# Patient Record
Sex: Male | Born: 1940
Health system: Southern US, Community
[De-identification: ages and names within clinical notes are randomized; demographics above are authoritative.]

## PROBLEM LIST (undated history)

## (undated) DIAGNOSIS — N183 Chronic kidney disease, stage 3 unspecified: Secondary | ICD-10-CM

## (undated) DIAGNOSIS — N529 Male erectile dysfunction, unspecified: Secondary | ICD-10-CM

## (undated) DIAGNOSIS — D329 Benign neoplasm of meninges, unspecified: Secondary | ICD-10-CM

## (undated) DIAGNOSIS — E876 Hypokalemia: Secondary | ICD-10-CM

## (undated) DIAGNOSIS — I482 Chronic atrial fibrillation, unspecified: Secondary | ICD-10-CM

## (undated) DIAGNOSIS — C4491 Basal cell carcinoma of skin, unspecified: Secondary | ICD-10-CM

## (undated) DIAGNOSIS — L309 Dermatitis, unspecified: Secondary | ICD-10-CM

## (undated) DIAGNOSIS — D696 Thrombocytopenia, unspecified: Secondary | ICD-10-CM

## (undated) DIAGNOSIS — K922 Gastrointestinal hemorrhage, unspecified: Secondary | ICD-10-CM

## (undated) DIAGNOSIS — I10 Essential (primary) hypertension: Secondary | ICD-10-CM

## (undated) HISTORY — PX: EYE SURGERY: SHX253

## (undated) HISTORY — DX: Hypokalemia: E87.6

## (undated) HISTORY — DX: Basal cell carcinoma of skin, unspecified: C44.91

## (undated) HISTORY — DX: Male erectile dysfunction, unspecified: N52.9

## (undated) HISTORY — PX: TONSILLECTOMY: SUR1361

## (undated) HISTORY — DX: Thrombocytopenia, unspecified: D69.6

## (undated) HISTORY — DX: Benign neoplasm of meninges, unspecified: D32.9

## (undated) HISTORY — DX: Dermatitis, unspecified: L30.9

## (undated) HISTORY — DX: Essential (primary) hypertension: I10

## (undated) HISTORY — DX: Gastrointestinal hemorrhage, unspecified: K92.2

## (undated) HISTORY — PX: TOENAIL EXCISION: SHX183

---

## 2004-12-08 ENCOUNTER — Encounter: Payer: Self-pay | Admitting: Cardiology

## 2004-12-08 ENCOUNTER — Encounter: Payer: Self-pay | Admitting: Family Medicine

## 2004-12-08 ENCOUNTER — Inpatient Hospital Stay (HOSPITAL_COMMUNITY): Admission: EM | Admit: 2004-12-08 | Discharge: 2004-12-15 | Payer: Self-pay | Admitting: *Deleted

## 2004-12-14 ENCOUNTER — Encounter: Payer: Self-pay | Admitting: Family Medicine

## 2004-12-14 ENCOUNTER — Encounter: Payer: Self-pay | Admitting: Cardiology

## 2004-12-14 HISTORY — PX: TEE WITH CARDIOVERSION: SHX5442

## 2006-05-19 ENCOUNTER — Inpatient Hospital Stay (HOSPITAL_COMMUNITY): Admission: EM | Admit: 2006-05-19 | Discharge: 2006-05-24 | Payer: Self-pay | Admitting: Emergency Medicine

## 2006-06-10 ENCOUNTER — Encounter: Payer: Self-pay | Admitting: Family Medicine

## 2007-08-13 ENCOUNTER — Encounter (INDEPENDENT_AMBULATORY_CARE_PROVIDER_SITE_OTHER): Payer: Self-pay | Admitting: *Deleted

## 2007-08-13 ENCOUNTER — Encounter: Payer: Self-pay | Admitting: Family Medicine

## 2007-08-13 LAB — CONVERTED CEMR LAB
ALT: 14 units/L
AST: 17 units/L
Albumin: 4.6 g/dL
Alkaline Phosphatase: 84 units/L
Chloride, Serum: 107 mmol/L
Creatinine, Ser: 1.17 mg/dL
LDL Cholesterol: 79 mg/dL
MCH: 33.7 pg
MCV: 91.4 fL
Platelets: 146 10*3/uL
Potassium, serum: 4.4 mmol/L
Sodium, serum: 144 mmol/L
Total Bilirubin: 0.8 mg/dL
Total Protein: 7.2 g/dL

## 2008-06-15 ENCOUNTER — Encounter (INDEPENDENT_AMBULATORY_CARE_PROVIDER_SITE_OTHER): Payer: Self-pay | Admitting: *Deleted

## 2008-06-25 ENCOUNTER — Ambulatory Visit: Payer: Self-pay | Admitting: Family Medicine

## 2008-06-25 DIAGNOSIS — F528 Other sexual dysfunction not due to a substance or known physiological condition: Secondary | ICD-10-CM

## 2008-06-25 DIAGNOSIS — I10 Essential (primary) hypertension: Secondary | ICD-10-CM | POA: Insufficient documentation

## 2008-06-25 DIAGNOSIS — I482 Chronic atrial fibrillation, unspecified: Secondary | ICD-10-CM | POA: Insufficient documentation

## 2008-06-30 ENCOUNTER — Encounter (INDEPENDENT_AMBULATORY_CARE_PROVIDER_SITE_OTHER): Payer: Self-pay | Admitting: *Deleted

## 2008-07-20 ENCOUNTER — Encounter (INDEPENDENT_AMBULATORY_CARE_PROVIDER_SITE_OTHER): Payer: Self-pay | Admitting: *Deleted

## 2008-08-09 ENCOUNTER — Ambulatory Visit: Payer: Self-pay | Admitting: Family Medicine

## 2008-08-09 DIAGNOSIS — IMO0002 Reserved for concepts with insufficient information to code with codable children: Secondary | ICD-10-CM

## 2008-08-12 ENCOUNTER — Encounter (INDEPENDENT_AMBULATORY_CARE_PROVIDER_SITE_OTHER): Payer: Self-pay | Admitting: *Deleted

## 2008-08-12 ENCOUNTER — Telehealth (INDEPENDENT_AMBULATORY_CARE_PROVIDER_SITE_OTHER): Payer: Self-pay | Admitting: *Deleted

## 2008-08-17 ENCOUNTER — Encounter (INDEPENDENT_AMBULATORY_CARE_PROVIDER_SITE_OTHER): Payer: Self-pay | Admitting: *Deleted

## 2008-08-23 ENCOUNTER — Telehealth (INDEPENDENT_AMBULATORY_CARE_PROVIDER_SITE_OTHER): Payer: Self-pay | Admitting: *Deleted

## 2008-08-26 ENCOUNTER — Ambulatory Visit: Payer: Self-pay | Admitting: Gastroenterology

## 2008-09-09 ENCOUNTER — Ambulatory Visit: Payer: Self-pay | Admitting: Gastroenterology

## 2008-09-16 ENCOUNTER — Telehealth (INDEPENDENT_AMBULATORY_CARE_PROVIDER_SITE_OTHER): Payer: Self-pay | Admitting: *Deleted

## 2008-11-24 ENCOUNTER — Telehealth (INDEPENDENT_AMBULATORY_CARE_PROVIDER_SITE_OTHER): Payer: Self-pay | Admitting: *Deleted

## 2009-02-08 ENCOUNTER — Ambulatory Visit: Payer: Self-pay | Admitting: Family Medicine

## 2009-02-22 ENCOUNTER — Telehealth (INDEPENDENT_AMBULATORY_CARE_PROVIDER_SITE_OTHER): Payer: Self-pay | Admitting: *Deleted

## 2009-05-25 ENCOUNTER — Telehealth (INDEPENDENT_AMBULATORY_CARE_PROVIDER_SITE_OTHER): Payer: Self-pay | Admitting: *Deleted

## 2009-08-09 ENCOUNTER — Telehealth (INDEPENDENT_AMBULATORY_CARE_PROVIDER_SITE_OTHER): Payer: Self-pay | Admitting: *Deleted

## 2009-11-07 ENCOUNTER — Ambulatory Visit: Payer: Self-pay | Admitting: Family Medicine

## 2009-11-08 LAB — CONVERTED CEMR LAB
ALT: 15 units/L (ref 0–53)
Basophils Relative: 0.7 % (ref 0.0–3.0)
Bilirubin, Direct: 0.2 mg/dL (ref 0.0–0.3)
CO2: 31 meq/L (ref 19–32)
Chloride: 106 meq/L (ref 96–112)
Eosinophils Absolute: 0.1 10*3/uL (ref 0.0–0.7)
HCT: 39 % (ref 39.0–52.0)
Hemoglobin: 13.3 g/dL (ref 13.0–17.0)
Lymphocytes Relative: 25.3 % (ref 12.0–46.0)
MCHC: 34.2 g/dL (ref 30.0–36.0)
MCV: 92.1 fL (ref 78.0–100.0)
Monocytes Absolute: 0.4 10*3/uL (ref 0.1–1.0)
Neutro Abs: 3.3 10*3/uL (ref 1.4–7.7)
Potassium: 4.6 meq/L (ref 3.5–5.1)
RBC: 4.23 M/uL (ref 4.22–5.81)
Sodium: 145 meq/L (ref 135–145)
Total CHOL/HDL Ratio: 3
Total Protein: 6.9 g/dL (ref 6.0–8.3)
Triglycerides: 107 mg/dL (ref 0.0–149.0)

## 2010-05-10 ENCOUNTER — Ambulatory Visit: Payer: Self-pay | Admitting: Family Medicine

## 2010-08-22 ENCOUNTER — Telehealth (INDEPENDENT_AMBULATORY_CARE_PROVIDER_SITE_OTHER): Payer: Self-pay | Admitting: *Deleted

## 2010-09-03 LAB — CONVERTED CEMR LAB
Basophils Absolute: 0 10*3/uL (ref 0.0–0.1)
Bilirubin, Direct: 0.1 mg/dL (ref 0.0–0.3)
Calcium: 9.6 mg/dL (ref 8.4–10.5)
Cholesterol: 157 mg/dL (ref 0–200)
GFR calc Af Amer: 86 mL/min
HCT: 42 % (ref 39.0–52.0)
HDL: 44.2 mg/dL (ref >39.0)
Hemoglobin: 14.4 g/dL (ref 13.0–17.0)
LDL Cholesterol: 88 mg/dL (ref 0–99)
Lymphocytes Relative: 22.8 % (ref 12.0–46.0)
MCHC: 34.3 g/dL (ref 30.0–36.0)
Monocytes Absolute: 0.4 10*3/uL (ref 0.1–1.0)
Neutro Abs: 3.1 10*3/uL (ref 1.4–7.7)
Platelets: 121 10*3/uL — ABNORMAL LOW (ref 150–400)
Potassium: 3.7 meq/L (ref 3.5–5.1)
RDW: 13.1 % (ref 11.5–14.6)
Sodium: 144 meq/L (ref 135–145)
TSH: 1.81 microintl units/mL (ref 0.35–5.50)
Total Bilirubin: 1.1 mg/dL (ref 0.3–1.2)
Triglycerides: 123 mg/dL (ref 0–149)

## 2010-09-04 ENCOUNTER — Ambulatory Visit: Payer: Self-pay | Admitting: Cardiology

## 2010-09-05 NOTE — Assessment & Plan Note (Signed)
Summary: cpx/alr  ns/alr   Vital Signs:  Patient profile:   70 year old male Height:      75 inches Weight:      210 pounds BMI:     26.34 Pulse rate:   62 / minute BP sitting:   130 / 74  (left arm)  Vitals Entered By: Doristine Devoid (November 07, 2009 10:20 AM) CC: CPX AND LABS    History of Present Illness: [name] here today for yearly exam.  1) HTN- BP well controlled on current meds.  no CP, SOB, HAs, visual changes, N/V, edema.  2) AFib- HR well controlled, sees Dr Swaziland (last seen in Jan).  3) ED- Viagra adequately working  4) Health Maintainence- UTD on colonoscopy, Pneumovax, Tetanus.  Preventive Screening-Counseling & Management  Alcohol-Tobacco     Alcohol drinks/day: 0     Smoking Status: never      Sexual History:  currently monogamous.        Drug Use:  never.    Problems Prior to Update: 1)  Special Screening Malignant Neoplasm of Prostate  (ICD-V76.44) 2)  Healthy Adult Male  (ICD-V70.0) 3)  Carcinoma, Skin, Squamous Cell  (ICD-173.9) 4)  Special Screening For Malignant Neoplasms Colon  (ICD-V76.51) 5)  Erectile Dysfunction  (ICD-302.72) 6)  Hypertension  (ICD-401.9) 7)  Atrial Fibrillation  (ICD-427.31)  Current Medications (verified): 1)  Dilt-Cd 240 Mg Xr24h-Cap (Diltiazem Hcl Coated Beads) .Marland Kitchen.. 1 By Mouth Once Daily 2)  Klor-Con M20 20 Meq Cr-Tabs (Potassium Chloride Crys Cr) .... Take One Tablet Daily 3)  Benazepril Hcl 10 Mg Tabs (Benazepril Hcl) .... Take One Tablet Daily 4)  Sotalol Hcl 80 Mg Tabs (Sotalol Hcl) .... Take One Tablet Two Times A Day 5)  Viagra 100 Mg Tabs (Sildenafil Citrate) .... Take As Needed 6)  Magnesium Oxide 400 Mg Caps (Magnesium Oxide) .... Take One Tablet Daily 7)  Saw Palmetto Berries 540 Mg Caps (Saw Palmetto (Serenoa Repens)) .... Take 2 Tablets Daily 8)  Vitamin E 400 Unit Caps (Vitamin E) .... Take One Tablet Daily 9)  Vitamin C 500 Mg Tabs (Ascorbic Acid) .... Take One Tablet Daliy 10)  Aspirin 81 Mg Tbec  (Aspirin) .... Take One Tablet Daily 11)  Fish Oil 1000 Mg Caps (Omega-3 Fatty Acids) .... Take One Tablet Daily  Allergies (verified): No Known Drug Allergies  Past History:  Past Medical History: Last updated: 06/25/2008 Atrial fibrillation Hypertension Erectile Dysfunction   Past Surgical History: Last updated: 06/25/2008 Tonsillectomy R eye surgery-spot on cornea   Family History: Last updated: 06/25/2008 CAD-mother-CHF HTN-mother STROKE-no DM-mother COLON CA-no PROSTATE CA-no  Social History: Last updated: 08/09/2008 Married No tobacco  Social History: Sexual History:  currently monogamous Drug Use:  never  Review of Systems  The patient denies anorexia, fever, weight loss, weight gain, vision loss, decreased hearing, chest pain, syncope, dyspnea on exertion, peripheral edema, prolonged cough, headaches, abdominal pain, melena, hematochezia, severe indigestion/heartburn, hematuria, suspicious skin lesions, depression, abnormal bleeding, enlarged lymph nodes, and testicular masses.    Physical Exam  General:  Well-developed,well-nourished,in no acute distress; alert,appropriate and cooperative throughout examination Head:  Normocephalic and atraumatic without obvious abnormalities. No apparent alopecia or balding. Eyes:  No corneal or conjunctival inflammation noted. EOMI. Perrla. Funduscopic exam benign, without hemorrhages, exudates or papilledema. Vision grossly normal. Ears:  External ear exam shows no significant lesions or deformities.  Otoscopic examination reveals clear canals, tympanic membranes are intact bilaterally without bulging, retraction, inflammation or discharge. Hearing  is grossly normal bilaterally. Nose:  External nasal examination shows no deformity or inflammation. Nasal mucosa are pink and moist without lesions or exudates. Mouth:  Oral mucosa and oropharynx without lesions or exudates.  Teeth in good repair. Neck:  No deformities, masses,  or tenderness noted. Lungs:  Normal respiratory effort, chest expands symmetrically. Lungs are clear to auscultation, no crackles or wheezes. Heart:  reg S1/S2 Abdomen:  Bowel sounds positive,abdomen soft and non-tender without masses, organomegaly or hernias noted. Rectal:  No external abnormalities noted. Normal sphincter tone. No rectal masses or tenderness. Genitalia:  Testes bilaterally descended without nodularity, tenderness or masses. No scrotal masses or lesions. No penis lesions or urethral discharge. Prostate:  Prostate gland firm and smooth, no enlargement, nodularity, tenderness, mass, asymmetry or induration. Msk:  No deformity or scoliosis noted of thoracic or lumbar spine.   Pulses:  +2 carotid, radial, DP Extremities:  No clubbing, cyanosis, edema, or deformity noted  Neurologic:  No cranial nerve deficits noted. Station and gait are normal. Plantar reflexes are down-going bilaterally. DTRs are symmetrical throughout. Sensory, motor and coordinative functions appear intact. Skin:  Intact without suspicious lesions or rashes Cervical Nodes:  No lymphadenopathy noted Inguinal Nodes:  No significant adenopathy Psych:  Cognition and judgment appear intact. Alert and cooperative with normal attention span and concentration. No apparent delusions, illusions, hallucinations   Impression & Recommendations:  Problem # 1:  HYPERTENSION (ICD-401.9) Assessment Unchanged BP well controlled.  due for labs.  no med changes at this time. His updated medication list for this problem includes:    Dilt-cd 240 Mg Xr24h-cap (Diltiazem hcl coated beads) .Marland Kitchen... 1 by mouth once daily    Benazepril Hcl 10 Mg Tabs (Benazepril hcl) .Marland Kitchen... Take one tablet daily    Sotalol Hcl 80 Mg Tabs (Sotalol hcl) .Marland Kitchen... Take one tablet two times a day  Orders: Venipuncture (14782) TLB-BMP (Basic Metabolic Panel-BMET) (80048-METABOL) TLB-CBC Platelet - w/Differential (85025-CBCD) TLB-TSH (Thyroid Stimulating  Hormone) (84443-TSH)  Problem # 2:  ATRIAL FIBRILLATION (ICD-427.31) Assessment: Unchanged continues to follow w/ Dr Swaz[place].  will fax copy of office visit and labs. His updated medication list for this problem includes:    Dilt-cd 240 Mg Xr24h-cap (Diltiazem hcl coated beads) .Marland Kitchen... 1 by mouth once daily    Sotalol Hcl 80 Mg Tabs (Sotalol hcl) .Marland Kitchen... Take one tablet two times a day    Aspirin 81 Mg Tbec (Aspirin) .Marland Kitchen... Take one tablet daily  Problem # 3:  ERECTILE DYSFUNCTION (ICD-302.72) Assessment: Unchanged sxs improved w/ Viagra, refill as needed. His updated medication list for this problem includes:    Viagra 100 Mg Tabs (Sildenafil citrate) .Marland Kitchen... Take as needed  Problem # 4:  HEALTHY ADULT MALE (ICD-V70.0) Assessment: Unchanged due for labs today, PE WNL. Orders: TLB-Hepatic/Liver Function Pnl (80076-HEPATIC) TLB-Lipid Panel (80061-LIPID)  Problem # 5:  SPECIAL SCREENING MALIGNANT NEOPLASM OF PROSTATE (ICD-V76.44) Assessment: New lab collected. Orders: TLB-PSA (Prostate Specific Antigen) (84153-PSA)  Complete Medication List: 1)  Dilt-cd 240 Mg Xr24h-cap (Diltiazem hcl coated beads) .Marland Kitchen.. 1 by mouth once daily 2)  Klor-con M20 20 Meq Cr-tabs (Potassium chloride crys cr) .... Take one tablet daily 3)  Benazepril Hcl 10 Mg Tabs (Benazepril hcl) .... Take one tablet daily 4)  Sotalol Hcl 80 Mg Tabs (Sotalol hcl) .... Take one tablet two times a day 5)  Viagra 100 Mg Tabs (Sildenafil citrate) .... Take as needed 6)  Magnesium Oxide 400 Mg Caps (Magnesium oxide) .... Take one tablet daily 7)  Saw  Palmetto Berries 540 Mg Caps (Saw palmetto (serenoa repens)) .... Take 2 tablets daily 8)  Vitamin E 400 Unit Caps (Vitamin e) .... Take one tablet daily 9)  Vitamin C 500 Mg Tabs (Ascorbic acid) .... Take one tablet daliy 10)  Aspirin 81 Mg Tbec (Aspirin) .... Take one tablet daily 11)  Fish Oil 1000 Mg Caps (Omega-3 fatty acids) .... Take one tablet daily  Patient  Instructions: 1)  Please schedule a follow-up appointment in 6 months to recheck blood pressure. 2)  Keep up the good work on diet and exercise 3)  Your exam looks great! 4)  We'll notify you of your lab results 5)  Call with any questions or concerns 6)  Happy Odis Luster!  Prescriptions: BENAZEPRIL HCL 10 MG TABS (BENAZEPRIL HCL) take one tablet daily  #90 x 3   Entered by:   Doristine Devoid   Authorized by:   Neena Rhymes MD   Signed by:   Doristine Devoid on 11/07/2009   Method used:   Electronically to        [facility] (mail-order)             ,          Ph: [phone]       Fax: [phone]   RxID:   [id number]      Immunization History:  Tetanus/Td Immunization History:    Tetanus/Td:  utd  per pt (11/07/2009)

## 2010-09-05 NOTE — Progress Notes (Signed)
Summary: NEEDS FOUR 90 DAY PRESC; ALSO NEEDS 30 DAY PRESCRIP  Phone Note Call from Patient Call back at Home Phone [phone]   Caller: Patient Summary of Call: PATIENT NEEDS FOUR 90 DAY PRESCRIPTIONS PLUS 3 REFILLS FOR THE FOLLOWING MEDICATIONS:  1)  SOTALOL HCL 80 MG---TWICE DAILY 2)  BENAZEPRIL 10 MG---ONE DAILY  3)  DILTIAZ 240 / 24 MG---ONE DAILY 4)  KLOR-CON M20----ONE DAILY  HE HAS SPOKEN TO [facility] TODAY----HIS ACCOUNT WITH THEM IS ALL SET UP  AND THEY TOLD HIM TO ASK [place] TO FAX THESE FOUR PRESCRIPTIONS TO THEM   HE ALSO NEEDS A 2 WEEK SUPPLY OF BENAZEPRIL 10 MG CALLED INTO [facility] ON [address]---CAN YOU CHECK---IF IT IS CHEAPTER TO GET 30 DAYS INSTEAD OF 15 DAYS, PLEASE CALL IN A 30--IF NOT, THEN CALL IN A 15 DAY SUPPLY Initial call taken by: Jerolyn Shin,  August 09, 2009 3:08 PM  Follow-up for Phone Call        spoke w/ patient aware prescription sent to pharmacy ........Marland KitchenDoristine Devoid  August 10, 2009 4:02 PM     Prescriptions: SOTALOL HCL 80 MG TABS (SOTALOL HCL) take one tablet two times a day  #180 x 3   Entered by:   Doristine Devoid   Authorized by:   Neena Rhymes MD   Signed by:   Doristine Devoid on 08/10/2009   Method used:   Electronically to        [facility]* (mail-order)             ,          Ph: [phone]       Fax: [phone]   RxID:   [id number] BENAZEPRIL HCL 10 MG TABS (BENAZEPRIL HCL) take one tablet daily  #30 x 3   Entered by:   Doristine Devoid   Authorized by:   Neena Rhymes MD   Signed by:   Doristine Devoid on 08/10/2009   Method used:   Electronically to        [facility]* (mail-order)             ,          Ph: [phone]       Fax: [phone]   RxID:   [id number] KLOR-CON M20 20 MEQ CR-TABS (POTASSIUM CHLORIDE CRYS CR) take one tablet daily  #90 x 3   Entered by:   Doristine Devoid   Authorized by:   Neena Rhymes MD   Signed by:   Doristine Devoid on 08/10/2009   Method used:   Electronically to   [facility] (mail-order)             ,          Ph: [phone]       Fax: [phone]   RxID:   [id number] DILT-CD 240 MG XR24H-CAP (DILTIAZEM HCL COATED BEADS) 1 by mouth once daily  #90 x 3   Entered by:   Doristine Devoid   Authorized by:   Neena Rhymes MD   Signed by:   Doristine Devoid on 08/10/2009   Method used:   Electronically to        [facility]* (mail-order)             ,          Ph: [phone]       Fax: [phone]   RxID:   [id number] BENAZEPRIL HCL 10 MG TABS (BENAZEPRIL HCL) take one tablet daily  #  30 x 0   Entered by:   Doristine Devoid   Authorized by:   Neena Rhymes MD   Signed by:   Doristine Devoid on 08/10/2009   Method used:   Electronically to        [facility] (retail)       [address]       [place], [place]  [address]       Ph: [phone]       Fax: [phone]   RxID:   [id number]

## 2010-09-05 NOTE — Assessment & Plan Note (Signed)
Summary: 6 MONTH RETURN VISIT///SPH   Vital Signs:  Patient profile:   70 year old male Height:      75 inches Weight:      207 pounds BMI:     25.97 Pulse rate:   96 / minute BP sitting:   120 / 86  (left arm)  Vitals Entered By: Doristine Devoid CMA (May 10, 2010 11:16 AM) CC: f/u on meds    History of Present Illness: 70 yo man here today for f/u on  1) HTN- adequate control on ACE, dilt, sotalol.  denies CP, SOB, HAs, visual changes, edema.  ranging 106-141/62-78.  2) Atrial Fibrillation- sees Dr Swaziland.  Current Medications (verified): 1)  Dilt-Cd 240 Mg Xr24h-Cap (Diltiazem Hcl Coated Beads) .Marland Kitchen.. 1 By Mouth Once Daily 2)  Klor-Con M20 20 Meq Cr-Tabs (Potassium Chloride Crys Cr) .... Take One Tablet Daily 3)  Benazepril Hcl 10 Mg Tabs (Benazepril Hcl) .... Take One Tablet Daily 4)  Sotalol Hcl 80 Mg Tabs (Sotalol Hcl) .... Take One Tablet Two Times A Day 5)  Viagra 100 Mg Tabs (Sildenafil Citrate) .... Take As Needed 6)  Magnesium Oxide 400 Mg Caps (Magnesium Oxide) .... Take One Tablet Daily 7)  Saw Palmetto Berries 540 Mg Caps (Saw Palmetto (Serenoa Repens)) .... Take 2 Tablets Daily 8)  Vitamin E 400 Unit Caps (Vitamin E) .... Take One Tablet Daily 9)  Vitamin C 500 Mg Tabs (Ascorbic Acid) .... Take One Tablet Daliy 10)  Aspirin 81 Mg Tbec (Aspirin) .... Take One Tablet Daily 11)  Fish Oil 1000 Mg Caps (Omega-3 Fatty Acids) .... Take One Tablet Daily  Allergies (verified): No Known Drug Allergies  Past History:  Past Medical History: Last updated: 06/25/2008 Atrial fibrillation Hypertension Erectile Dysfunction   Review of Systems      See HPI  Physical Exam  General:  Well-developed,well-nourished,in no acute distress; alert,appropriate and cooperative throughout examination Head:  Normocephalic and atraumatic without obvious abnormalities. No apparent alopecia or balding. Neck:  No deformities, masses, or tenderness noted. Lungs:  Normal respiratory  effort, chest expands symmetrically. Lungs are clear to auscultation, no crackles or wheezes. Heart:  reg S1/S2, no murmur Pulses:  +2 carotid, radial, DP Extremities:  No clubbing, cyanosis, edema, or deformity noted    Impression & Recommendations:  Problem # 1:  HYPERTENSION (ICD-401.9) Assessment Unchanged BP remains controlled.  asymptomatic.  no med changes at this time. His updated medication list for this problem includes:    Dilt-cd 240 Mg Xr24h-cap (Diltiazem hcl coated beads) .Marland Kitchen... 1 by mouth once daily    Benazepril Hcl 10 Mg Tabs (Benazepril hcl) .Marland Kitchen... Take one tablet daily    Sotalol Hcl 80 Mg Tabs (Sotalol hcl) .Marland Kitchen... Take one tablet two times a day  Complete Medication List: 1)  Dilt-cd 240 Mg Xr24h-cap (Diltiazem hcl coated beads) .Marland Kitchen.. 1 by mouth once daily 2)  Klor-con M20 20 Meq Cr-tabs (Potassium chloride crys cr) .... Take one tablet daily 3)  Benazepril Hcl 10 Mg Tabs (Benazepril hcl) .... Take one tablet daily 4)  Sotalol Hcl 80 Mg Tabs (Sotalol hcl) .... Take one tablet two times a day 5)  Viagra 100 Mg Tabs (Sildenafil citrate) .... Take as needed 6)  Magnesium Oxide 400 Mg Caps (Magnesium oxide) .... Take one tablet daily 7)  Saw Palmetto Berries 540 Mg Caps (Saw palmetto (serenoa repens)) .... Take 2 tablets daily 8)  Vitamin E 400 Unit Caps (Vitamin e) .... Take one tablet daily 9)  Vitamin C 500 Mg Tabs (Ascorbic acid) .... Take one tablet daliy 10)  Aspirin 81 Mg Tbec (Aspirin) .... Take one tablet daily 11)  Fish Oil 1000 Mg Caps (Omega-3 fatty acids) .... Take one tablet daily  Patient Instructions: 1)  Follow up in 6 months for your complete physical- do not eat before this appt 2)  Keep up the good work!  You look great! 3)  Call with any questions or concerns 4)  Send my love to Vernona Rieger- we're wishing her a speedy recovery! 5)  Hang in there!!!   Immunization History:  Influenza Immunization History:    Influenza:  historical (04/06/2010)

## 2010-09-07 NOTE — Progress Notes (Signed)
Summary: 3 prescrip to [facility] and 1 prescrip to [facility]  Phone Note Refill Request Call back at Home Phone [phone] Message from:  Patient's wife on August 22, 2010 3:51 PM  Refills Requested: Medication #1:  DILT-CD 240 MG XR24H-CAP 1 by mouth once daily   Notes: please call into [facility]  Medication #2:  KLOR-CON M20 20 MEQ CR-TABS take one tablet daily   Notes: please call into [facility]  Medication #3:  SOTALOL HCL 80 MG TABS take one tablet two times a day   Notes: please call into [facility]  Medication #4:  SOTALOL HCL 80 MG TABS take one tablet two times a day   Notes: also [facility] 30 [facility] [address], [place] see notes --3 prescriptions for [employer] and 1 prescription for local pharmacy = [facility] on [address]  Next Appointment Scheduled: Mon 4/2   [place] Initial call taken by: Jerolyn Shin,  August 22, 2010 4:40 PM    Prescriptions: KLOR-CON M20 20 MEQ CR-TABS (POTASSIUM CHLORIDE CRYS CR) take one tablet daily  #90 x 3   Entered by:   Doristine Devoid CMA   Authorized by:   Neena Rhymes MD   Signed by:   Doristine Devoid CMA on 08/24/2010   Method used:   Electronically to        [facility] (retail)             ,          Ph: [phone]       Fax: [phone]   RxID:   [id number] DILT-CD 240 MG XR24H-CAP (DILTIAZEM HCL COATED BEADS) 1 by mouth once daily  #90 x 3   Entered by:   Doristine Devoid CMA   Authorized by:   Neena Rhymes MD   Signed by:   Doristine Devoid CMA on 08/24/2010   Method used:   Electronically to        [facility] (retail)             ,          Ph: [phone]       Fax: [phone]   RxID:   [id number] SOTALOL HCL 80 MG TABS (SOTALOL HCL) take one tablet two times a day  #180 x 3   Entered by:   Doristine Devoid CMA   Authorized by:   Neena Rhymes MD   Signed by:   Doristine Devoid CMA on 08/24/2010   Method used:   Electronically to        [facility] (retail)             ,          Ph:  [phone]       Fax: [phone]   RxID:   [id number] SOTALOL HCL 80 MG TABS (SOTALOL HCL) take one tablet two times a day  #30 x 0   Entered by:   Doristine Devoid CMA   Authorized by:   Neena Rhymes MD   Signed by:   Doristine Devoid CMA on 08/24/2010   Method used:   Electronically to        [facility] (retail)       [address]       [place], [place]  [address]       Ph: [phone]       Fax: [phone]   RxID:   [id number]

## 2010-10-12 ENCOUNTER — Encounter: Payer: Self-pay | Admitting: Family Medicine

## 2010-11-06 ENCOUNTER — Ambulatory Visit (INDEPENDENT_AMBULATORY_CARE_PROVIDER_SITE_OTHER): Payer: Medicare Other | Admitting: Family Medicine

## 2010-11-06 ENCOUNTER — Encounter: Payer: Self-pay | Admitting: Family Medicine

## 2010-11-06 DIAGNOSIS — R972 Elevated prostate specific antigen [PSA]: Secondary | ICD-10-CM | POA: Insufficient documentation

## 2010-11-06 DIAGNOSIS — Z0000 Encounter for general adult medical examination without abnormal findings: Secondary | ICD-10-CM

## 2010-11-06 DIAGNOSIS — Z125 Encounter for screening for malignant neoplasm of prostate: Secondary | ICD-10-CM

## 2010-11-06 DIAGNOSIS — I4891 Unspecified atrial fibrillation: Secondary | ICD-10-CM

## 2010-11-06 DIAGNOSIS — I10 Essential (primary) hypertension: Secondary | ICD-10-CM

## 2010-11-06 LAB — CBC WITH DIFFERENTIAL/PLATELET
Basophils Relative: 0.3 % (ref 0.0–3.0)
Eosinophils Relative: 1 % (ref 0.0–5.0)
Lymphocytes Relative: 12 % (ref 12.0–46.0)
MCV: 92.5 fl (ref 78.0–100.0)
Monocytes Relative: 6.5 % (ref 3.0–12.0)
Neutrophils Relative %: 80.2 % — ABNORMAL HIGH (ref 43.0–77.0)
RBC: 4.39 Mil/uL (ref 4.22–5.81)
WBC: 9.6 10*3/uL (ref 4.5–10.5)

## 2010-11-06 LAB — PSA: PSA: 5.97 ng/mL — ABNORMAL HIGH (ref 0.10–4.00)

## 2010-11-06 LAB — BASIC METABOLIC PANEL
BUN: 23 mg/dL (ref 6–23)
CO2: 30 mEq/L (ref 19–32)
Chloride: 101 mEq/L (ref 96–112)
Glucose, Bld: 99 mg/dL (ref 70–99)
Potassium: 4.3 mEq/L (ref 3.5–5.1)

## 2010-11-06 LAB — HEPATIC FUNCTION PANEL
ALT: 12 U/L (ref 0–53)
AST: 17 U/L (ref 0–37)
Albumin: 4 g/dL (ref 3.5–5.2)

## 2010-11-06 LAB — TSH: TSH: 1.53 u[IU]/mL (ref 0.35–5.50)

## 2010-11-06 LAB — LIPID PANEL: Total CHOL/HDL Ratio: 3

## 2010-11-06 NOTE — Progress Notes (Signed)
  Subjective:    Patient ID: [name], male    DOB: Jul 30, 1941, 70 y.o.   MRN: [id number]  HPI Here today for CPE.  Risk Factors: HTN- BP mildly elevated today, was recently on cold meds.  Home BPs ranging 106-141/60-80.  No CP, SOB, HAs, visual changes, edema. Afib- following w/ Dr Swaziland Physical Activity: walks regularly, cares for handicapped sister Depression: denies sxs of depression Hearing: normal to whispered voice ADL's: independent Cognitive: normal linear thought process, memory intact, no deficits noted. Home Safety: safe at home, lives w/ wife Height, Weight, BMI, Visual Acuity: see vitals, vision corrected to 20/20 w/ glasses Counseling: gets yearly flu shots, has had Pneumovax, Vostavax. Labs Ordered: See A&P Care Plan: See A&P Preventative Care: UTD on colonoscopy ([place])   Review of Systems The patient denies anorexia, fever, weight loss, weight gain, vision loss, decreased hearing, hoarseness, chest pain, syncope, dyspnea on exertion, peripheral edema, prolonged cough, headaches, abdominal pain, melena, severe indigestion/heartburn, hematuria, suspicious skin lesions, depression, abnormal bleeding, enlarged lymph nodes, and testicular masses.  Past medical, surgical, family, and social hx reviewed for relevance and changes      Objective:   Physical Exam BP 140/88  Temp(Src) 99.4 F (37.4 C) (Oral)  Ht 6\' 3"  (1.905 m)  Wt 211 lb 2 oz (95.766 kg)  BMI 26.39 kg/m2  General Appearance:    Alert, cooperative, no distress, appears stated age  Head:    Normocephalic, without obvious abnormality, atraumatic  Eyes:    PERRL, conjunctiva/corneas clear, EOM's intact  Ears:    Normal TM's and external ear canals, both ears  Nose:   Nares normal, septum midline, mucosa normal, no drainage   or sinus tenderness  Throat:   Lips, mucosa, and tongue normal; gums normal  Neck:   Supple, symmetrical, trachea midline, no adenopathy;       thyroid:  No  enlargement/tenderness/nodules  Back:     Symmetric, no curvature, ROM normal, no CVA tenderness  Lungs:     Clear to auscultation bilaterally, respirations unlabored  Chest wall:    No tenderness or deformity  Heart:    Regular rate and rhythm, S1 and S2 normal, no murmur, rub   or gallop  Abdomen:     Soft, non-tender, bowel sounds active all four quadrants,    no masses, no organomegaly  Genitalia:    Normal male without lesion, discharge or tenderness  Rectal:    Normal tone, normal prostate, no masses or tenderness;   guaiac negative stool  Extremities:   Extremities normal, atraumatic, no cyanosis or edema  Pulses:   2+ and symmetric all extremities  Skin:   Skin color, texture, turgor normal, no rashes or lesions  Lymph nodes:   Cervical, supraclavicular, and axillary nodes normal  Neurologic:   CNII-XII intact. Normal strength, sensation and reflexes      throughout         Assessment & Plan:

## 2010-11-06 NOTE — Patient Instructions (Signed)
Follow up in 6 months to recheck your blood pressure Your exam looks great!  Keep up the good work! We'll notify you of your lab results Call with any questions or concerns Happy [name]!

## 2010-11-06 NOTE — Assessment & Plan Note (Signed)
BP mildly elevated this AM but pt's list of home BPs well controlled.  Asymptomatic.  No changes.

## 2010-11-06 NOTE — Assessment & Plan Note (Signed)
Pt's PE WNL  UTD on health maintenance.  Anticipatory guidance provided. 

## 2010-11-06 NOTE — Assessment & Plan Note (Signed)
Following w/ Dr Swaziland.  Heart regular today.

## 2010-11-06 NOTE — Progress Notes (Signed)
Addended by: Alease Medina on: 11/06/2010 02:04 PM   Modules accepted: Orders

## 2010-11-22 ENCOUNTER — Other Ambulatory Visit: Payer: Self-pay | Admitting: *Deleted

## 2010-11-22 MED ORDER — BENAZEPRIL HCL 10 MG PO TABS: 10.0000 mg | ORAL_TABLET | Freq: Every day | ORAL | Status: DC

## 2010-12-22 NOTE — Discharge Summary (Signed)
[name], [name]                ACCOUNT NO.:  [id number]   MEDICAL RECORD NO.:  [id number]          PATIENT TYPE:  INP   LOCATION:  3711                         FACILITY:  [facility]   PHYSICIAN:  Mobolaji B. Bakare, M.D.DATE OF BIRTH:  [birth year]   DATE OF ADMISSION:  05/19/2006  DATE OF DISCHARGE:  05/24/2006                                 DISCHARGE SUMMARY   FINAL DIAGNOSES:  1. Atrial fibrillation with rapid ventricular response.  Patient is now in      normal sinus rhythm at discharge.  2. Probable urinary tract infection.  3. Allergy to ciprofloxacin with a rash.  4. Acute renal insufficiency, resolved.  5. Probable 13 x 12 mm meningioma in right frontal region.  6. Hypertension.  7. Thrombocytopenia, improved.  8. Hypokalemia, corrected.   PROCEDURES:  1. A CT scan showed no acute intracranial abnormality, probable 13 x 12 mm      meningioma at right frontal region of the vertex.  This is a calcified      extra-axial mass in the right frontal region of the vertex.  2. Ultrasound of the abdomen:  Negative.  3. Chest x-ray:  No acute cardiopulmonary disease, mild cardiomegaly.   CONSULTATIONS:  Cardiology consult, Dr. Lorelle Gibbs.   BRIEF HISTORY:  Please refer to the admission H and P.  In brief, [name]  is a pleasant 70 year old gentleman with known history of atrial  fibrillation and hypertension.  He stated he was feeling well on the night  before admission and developed burning urination with increased frequency.  The patient took some of his wife's Septra and Pyridium.  He subsequently  developed fever and chills.  He presented to the hospital on May 19, 2006 and was noted to be in atrial fibrillation with rapid ventricular  response, rate of 128.  He was hemodynamically stable.  He was started on  Cardizem infusion and heparin.  Cardiology was consulted and this was  provided by Dr. Lyn Records.   Patient was initially started on ciprofloxacin for  presumed UTI in the  emergency room and he appeared to develop a rash at the entry site of the IV  and ciprofloxacin was discontinued and patient was placed on Rocephin.  He  was subsequently admitted to a monitored bed.   HOSPITAL COURSE:  1. Atrial fibrillation with rapid ventricular response.  The patient's      heart rate was controlled with Cardizem infusion.  This was      subsequently transitioned to p.o. Cardizem.  He was continued on      heparin infusion and also started on Coumadin and his INR is almost      therapeutic at the time of discharge.  The patient does have a history      of atrial fibrillation, but this has been fairly well controlled in the      outpatient setting.  He had a similar episode in June 2006, which was      related a febrile illness.  Patient had a TEE and cardioversion at that  time.  He was followed up by Dr. Swaziland, who recommended starting      sotalol 80 mg b.i.d.  The patient subsequently converted to normal      sinus rhythm on sotalol.  He has maintained normal sinus rhythm since      then.  2. Probable urinary tract infection.  Patient was treated with IV Rocephin      and completed treatment.  Urine culture came back showing insignificant      growth, 7000.  It is to be noted that the patient had taken Septra      prior to urine culture.  Nevertheless, he completed treatment with IV      Rocephin.   Patient was Lotrel prior to hospitalization.  Due to the combination of  amlodipine and benazepril, patient's Lotrel was discontinued because he was  already on Cardizem, which is also a beta-blocker.  He will be discharged  home on benazepril and Cardizem p.o.   1. Acute renal insufficiency.  This was felt to be related to acute      febrile illness and it was prerenal and responded well to IV hydration.      Creatinine, at the time of discharge, was 1.1 and BUN of 16.  2. Thrombocytopenia.  The patient had a platelet of 135,000 at the  time of      admission.  This was thought to be related to ongoing febrile illness.      This improved gradually during the course of hospitalization and      improved to 153,000 at the time of discharge.   DISCHARGE MEDICATIONS:  1. Coumadin 5 mg 2 tablets today and 1-1/2 tablets daily until he does his      PT/INR at Dr. Elvis Coil office on May 28, 2006.  Coumadin will then      be dosed appropriately.  2. Cardizem 240 mg daily.  3. Lotensin 10 mg daily.  4. Betapace 80 mg b.i.d.  5. Vitamin E.  6. Saw Palmetto.  7. Aspirin 81 mg daily.   DISCHARGE LABORATORY DATA:  White cell 4.7, hemoglobin 11.4, hematocrit  33.1, platelets 153,000, MCV 92.  PT/INR 22.2/1.9.  Sodium 144, potassium  3.9, chloride 111, bicarb 26, BUN 16, creatinine 1.1.   CONDITION ON DISCHARGE:  Stable.   VITALS AT DISCHARGE:  Temperature 96.9, pulse 59, blood pressure 134/80, O2  sat 98% on room air.  The patient was in normal sinus rhythm.   FOLLOWUP:  1. Follow up with Dr. Swaziland in 2 weeks.  2. Patient to have prothrombin time done on Tuesday, May 28, 2006, and      CBC at Dr. Elvis Coil office.  3. Follow up with Dr. Artis Flock in 1 to 2 weeks.   RECOMMENDATIONS:  Repeat a CT scan in 6 months to monitor probable  meningioma on head CT scan.      Mobolaji B. Corky Downs, M.D.  Electronically Signed     MBB/MEDQ  D:  05/24/2006  T:  05/25/2006  Job:  [id number]   cc:   Peter M. Swaziland, M.D.  Quita Skye Artis Flock, M.D.

## 2010-12-22 NOTE — Consult Note (Signed)
NAMEFREDY, [name] NO.:  [id number]   MEDICAL RECORD NO.:  [id number]          PATIENT TYPE:  EMS   LOCATION:  MAJO                         FACILITY:  [facility]   PHYSICIAN:  Lyn Records, M.D.   DATE OF BIRTH:  10-01-1940   DATE OF CONSULTATION:  DATE OF DISCHARGE:                                   CONSULTATION   CONCLUSIONS:  1. Urinary tract infection with chills, fever, dysuria, and abnormal      urinalysis. Infection may be partially treated. This patient has been      taking some of his wife's Septra.  2. Asymptomatic atrial fibrillation with rapid ventricular response,      duration unknown.      a.     Prior history of atrial fibrillation undergoing __________       cardioversion Dec 08, 2004.  3. History of hypertension.   RECOMMENDATIONS:  1. IV Cardizem for rate control.  2. Oral beta blocker therapy if needed.  3. IV heparin and start Coumadin therapy.  4. Eventual electrical conversion if does not spontaneously convert.  5. Will probably require long-term antiarrhythmic therapy. Has previously      been on sotalol.   COMMENTS:  The patient is 70 years of age and has a prior history of urinary  tract infections and came to the emergency room today because since Friday  he has been having burning with urination, chills, and fever. He had taken  some of his wife's Septra. The patient then decided to come to the emergency  room today because of continued dysuria. He was being worked up for this  when it was noted that his heart rate was fast, and EKG demonstrated atrial  fib with rapid ventricular response. This is similar to a prior admission on  Dec 08, 2004 when the patient felt weak and felt that he was having a fever,  but at that time did not have dysuria or other urinary symptoms. On that  occasion he was treated with rate control followed by a transesophageal  guided electrical cardioversion. He was sent home on sotalol, but  subsequently  this medication and Coumadin have been discontinued.   ALLERGIES:  Possibly CIPRO and HYDROCHLOROTHIAZIDE causes photosensitivity.   HABITS:  He does not smoke or drink.   MEDICATIONS:  Lotrel 10/20 (10 mg Norvasc/20 mg benazepril).   SIGNIFICANT MEDICAL PROBLEMS:  1. Total dermatitis secondary to HCTZ.  2. Hypertension.  3. Prior history of atrial fibrillation.  4. History of prior urinary tract infections.   FAMILY HISTORY:  Negative for cardiac disease with exception that his mother  required a pacemaker.   REVIEW OF SYSTEMS:  Urinary complaints are his only symptom.   PHYSICAL EXAMINATION:  VITAL SIGNS:  Blood pressure 124/70, heart rate  ranges between 110 and 140. Monitor reveals atrial fibrillation. He is not  currently febrile.  GENERAL:  He denies chills and fever at this time.  NECK:  Neck veins not distended.  LUNGS:  Clear.  CARDIAC:  No gallop or rub. No click.  ABDOMEN:  Soft. Bowel  sounds normal.  EXTREMITIES:  No edema.   Chest x-ray pending. EKG:  No acute change, atrial fibrillation with rapid  ventricular response.   LABORATORY DATA:  Reveals white blood cell count of 15,000, left shift,  platelet count 135,000. INR 1.2. Potassium 3.4, BUN and creatinine 22 and  1.5. Total bilirubin 1.5. Point of care markers are normal. Urinalysis is  markedly abnormal with 10-20 white cells per high-power field with positive  nitrate, moderate leukocyte.   COMMENT:  The patient is back in atrial fibrillation and is asymptomatic.  This was found coincidentally as he presented today with chills, fever by  history, and dysuria. We will control rate with Cardizem. We will start IV  heparin. He will also need to have Coumadin started after rate control,  probable eventual outpatient cardioversion. Further management per Dr.  Greggory Stallion.      Lyn Records, M.D.  Electronically Signed     HWS/MEDQ  D:  05/19/2006  T:  05/20/2006  Job:  [id number]   cc:   Quita Skye. Artis Flock,  M.D.  Peter M. Sw[place]iland, M.D.

## 2010-12-22 NOTE — H&P (Signed)
[name], VENABLE NO.:  [id number]   MEDICAL RECORD NO.:  [id number]          PATIENT TYPE:  EMS   LOCATION:  MAJO                         FACILITY:  [facility]   PHYSICIAN:  [name], M.D. DATE OF BIRTH:  09/23/1940   DATE OF ADMISSION:  05/19/2006  DATE OF DISCHARGE:                                HISTORY & PHYSICAL   PRIMARY CARE PHYSICIAN:  [name] dl, M.D.   CARDIOLOGIST:  Samuel M. Thornton, M.D.   CHIEF COMPLAINT:  Not feeling too good.   HISTORY OF PRESENT ILLNESS:  [name] is a 70 year old gentleman with past  medical history of atrial fibrillation, hypertension, who states that this  past Friday night he developed burning during urination with increased  urinary frequency.  He had to get up at least 6 times during the course of  the night to urinate.  He took some of his wife's Septra and Pyridium the  following day to alleviate his symptoms.  On Saturday, he developed chills,  felt cold and febrile. This morning he initially felt better after he woke  up around 9 o'clock a.m.  He walked from the upstairs of his home to the  kitchen.  He grabbed a glass of water, stood at the sink.  His wife called  his name; however, he did not respond to her calling his name for at least 4-  5 minutes.  She helped him from the sink to sit down and subsequently  brought him to the hospital for further evaluation.  The patient did not  lose consciousness.  The patient denies having any nausea or vomiting, no  current chest pain, no shortness of breath, and currently states that he  feels good.  He denies having any current urinary pain.   PAST MEDICAL HISTORY:  1. Atrial fibrillation with RVR.  The patient underwent an electrical      cardioversion in 2006 by Dr. Peter Thornton.  2. Hypertension.  3. Chronic dermatitis.  4. Photosensitivity reaction to a diuretic that was given in the past.  5. Hypokalemia.   HOME MEDICATIONS:  Lotrel, aspirin, vitamin  E   SOCIAL HISTORY:  Cigarettes: The patient stopped smoking 35 years ago.  He  smoked for 10 years prior to that.  Alcohol: The patient denies.   FAMILY HISTORY:  Father has a history of pancreatic cancer.  Mother has  borderline diabetes mellitus, osteoarthritis, heart problems, and she had a  pacemaker as well as asthma.   REVIEW OF SYSTEMS:  No chest pain, no shortness of breath, no nausea, no  emesis, no abdominal complaints.   PHYSICAL EXAMINATION:  GENERAL: The patient is awake.  He is cooperative.  He is in no obvious distress.  VITAL SIGNS: Temperature 98.8, blood pressure 129/82, heart rate 98,  respirations 18, O2 saturation 98%.  HEENT:  Normocephalic and atraumatic.  Slight scleral icterus bilaterally.  Oral mucosa is pink, no thrush, no exudate.  NECK:  Supple.  No lymphadenopathy. Thyroid not palpable.  CARDIAC:  Irregularly irregular.  RESPIRATORY: No crackles or wheezes.  ABDOMEN: Flat,  soft, nontender, nondistended.  Positive bowel sounds. No  masses.  EXTREMITIES: No leg edema.  NEUROLOGIC: The patient is alert and oriented x3.  MUSCULOSKELETAL 5/5 upper and lower extremity strength.   LABORATORY DATA:  White blood cell count 15.2, hemoglobin 14.5, hematocrit  42.2, platelets 135.  PT 15.3, INR 1.2, PTT 33.  Total protein 6.3, albumin  3.5, sodium 136, potassium 3.4, chloride 104, CO2 22, BUN 22, creatinine  1.5, glucose 179, bilirubin total 1.5, alkaline phosphatase 73, SGOT 16,  SGPT 14.  Point-of-care CK-MB 1.3, point-of-care troponin I less than 0.05.  Point-of-care myoglobin 145.  Urinalysis reveals moderate bilirubin and  ketones 15, nitrite positive, leukocytes moderate, wbc's 11-20.   Chest x-ray reveals mild cardiomegaly.   ASSESSMENT AND PLAN:  1. Urinary tract infection.  We will check the patient urine cultures as      well as blood cultures.  The patient was initially started on      ciprofloxacin; however, he appeared to develop a rash at the  entry site      of the IV; therefore, that was discontinued, and Rocephin has since      been started. Will continue Rocephin for now pending the final report      of sensitivities.  2. Atrial fibrillation with rapid ventricular response.  Cardiology has      been consulted, and they have seen the patient.  The patient has been      started on IV heparin as well as Cardizem.  Will continue to follow      their further recommendations.  3. Hypertension.  This currently appears to be stable. We will resume the      patient's previously prescribed antihypertensive medications.  4. Altered mental status.  The etiology of this is questionable.  Again,      the patient did not lose consciousness.  In light of the patient having      atrial fibrillation, will check CT scan without contrast to make sure      there was not any type of acute event that precipitated the sudden      change in his mental status.  5. Elevated transaminases.  The etiology of this is questionable.  Will      check an ultrasound of the patient's abdomen and fractionate the      bilirubin.  6. Gastrointestinal prophylaxis.  Will provide Protonix.  7. Hypokalemia.  Will supplement this.  8. Thrombocytopenia.  The etiology of this is questionable.  The patient      shows no obvious signs of bleeding at this particular time. Will      monitor closely for now.      [name], M.D.  Electronically Signed     OR/MEDQ  D:  05/19/2006  T:  05/19/2006  Job:  [id number]   cc:   Quita Skye. Artis Flock, M.D.  Samuel M. Thornton, M.D.

## 2010-12-22 NOTE — Discharge Summary (Signed)
[name], Thornton NO.:  [id number]   MEDICAL RECORD NO.:  [id number]          PATIENT TYPE:  INP   LOCATION:  3743                         FACILITY:  [facility]   PHYSICIAN:  [name], M.D.  DATE OF BIRTH:  Mar 09, 1941   DATE OF ADMISSION:  12/08/2004  DATE OF DISCHARGE:  12/15/2004                                 DISCHARGE SUMMARY   HISTORY OF PRESENT ILLNESS:  [name] is a 70 year old white male with a  history of hypertension who presented with a 4-day history of increased  weakness and fatigue.  On the morning of admission, he noticed a high fever  and presented to [facility] where he was found to have a temperature 101.4.  He was also found to be in atrial fibrillation with rapid ventricular  response and he was transferred to the emergency room. He denied any chest  pain, shortness of breath, palpitations, or dizziness. He had no recent URI  or UTI symptoms. He has no prior cardiac history. For details of his past  medical history, social history, family history, and physical exam, please  see admission history and physical.   LABORATORY DATA:  White count was 7100, hemoglobin 13.5, hematocrit 40.2,  platelets 178,000. Sed rate was 16. PT was 14.2 with an INR of 1.2. Sodium  136, potassium 2.6, chloride 97, CO2 31, glucose of 124, BUN 17, creatinine  1.2, calcium 8.6, albumin 3.9. LFT's were all normal with exception of total  bilirubin of 1.7. Magnesium was 2.1. Glycosylated hemoglobin was 5.7. Serial  cardiac enzymes were all negative x3. Cholesterol was 90, triglycerides 91,  HDL of 32, and LDL of 40. TSH was normal at 0.834, free T4 was 1.08.  Urinalysis was normal. Chest x-ray showed mild cardiomegaly with no active  disease. ECG showed atrial fibrillation with rapid ventricular response,  otherwise normal.   HOSPITAL COURSE:  Primary issue was treatment of atrial fibrillation. The  patient was treated with rate control with IV Cardizem and then  was started  on oral beta blockade. His Cardizem drip was later transitioned to oral  Cardizem. However, despite increasing his Cardizem to 240 mg per day and  Lopressor 100 mg b.i.d., he still had poor rate control and his heart rate  would increase to the 160's with any activity. Because of his refractory  rate control, we elected to proceed with the transesophageal-guided  cardioversion and this was performed on November 14, 2004. His TEE showed mild  mitral insufficiency and trace pulmonic insufficiency. Left atrium and left  atrial appendage were normal without evidence of thrombus. There was a small  PFO with left-to-right shunt by Doppler. Otherwise, his TEE was normal. We  then successfully cardioverted the patient using 150 joules and he returned  to normal sinus rhythm where he remained throughout the remainder of his  hospital stay. The patient was begun on Betapace 80 mg p.o. the day prior to  his cardioversion and remained on this dose. He had no significant  bradycardia. His QT at the time of discharge was 497 corrected. He had no  further arrhythmias. In addition, the patient was anticoagulated on  admission with Lovenox. He was started on oral Coumadin. He received 7.5 mg  the first 2 days, then 10 mg, and then 12.5 mg, and then reduced down to 10  mg a day again. His most recent protime on the day of discharge was 22.7  with an INR of 2.7. His Lovenox was discontinued 2 days prior to discharge.   The patient's other problems during his hospital stay included severe  hypokalemia. This was aggressively repleted with both oral and IV potassium.  It was felt that his low potassium was due to chronic hydrochlorothiazide  use. His potassium level prior to discharge was 4.1. The patient also had a  persistent fever during his hospital stay. Blood and urine cultures were  negative. He did have a severe eruptive skin rash involving his arms, hands,  neck, and face. The patient was  seen in consultation by Dr. Karlyn Thornton, of  dermatology, who felt that this was a photoallergenic eruption due to  hydrochlorothiazide. He recommended triamcinolone 0.1% cream to affected  areas twice a day. With this treatment, his rash did show significant  improvement during his hospital stay and he subsequently defervesced. The  patient required no antibiotic therapy. His blood pressure remained stable  throughout his hospital stay. He was felt to be stable for discharge and was  discharged on Dec 15, 2004.   DISCHARGE DIAGNOSES:  1.  Atrial fibrillation with rapid ventricular response.  2.  Fever secondary to photoallergenic rash.  3.  Severe hypokalemia.  4.  Hypertension.   DISCHARGE MEDICATIONS:  1.  Sotalol 80 mg b.i.d.  2.  Coumadin 7.5 mg daily.  3.  Diltiazem SR 240 mg daily.  4.  Triamcinolone 0.1% cream applied to affected area twice a day.   DISCHARGE INSTRUCTIONS:  The patient will have follow-up protime at Dr.  Elvis Thornton office on Tuesday, Dec 19, 2004.  He will have follow-up office  visit with Dr. Swaziland in 2 weeks and we will obtain an ECG at that time. He  is to gradually increase his activity and may return to light work next  week.   DISCHARGE STATUS:  Improved.      PMJ/MEDQ  D:  12/15/2004  T:  12/15/2004  Job:  [id number]   cc:   [name], M.D.  [address]  [place]  [place] [address]  Fax: [phone]

## 2010-12-22 NOTE — Op Note (Signed)
NAMEEULA, MAZZOLA NO.:  [id number]   MEDICAL RECORD NO.:  [id number]          PATIENT TYPE:  INP   LOCATION:  3743                         FACILITY:  [facility]   PHYSICIAN:  Peter M. Swaziland, M.D.  DATE OF BIRTH:  01-31-1941   DATE OF PROCEDURE:  12/14/2004  DATE OF DISCHARGE:                                 OPERATIVE REPORT   PROCEDURE:  Transesophageal echo guided elective cardioversion.   INDICATIONS FOR PROCEDURE:  70 year old white male who presents with  persistent atrial fibrillation with rapid ventricular response.  The patient  has been anticoagulation, is therapeutic, but still has poor rate control  necessitating early cardioversion.   TEE was performed under sedation with 4 mg IV Versed and 50 mcg IV Fentanyl.  TEE images demonstrate mild mitral insufficiency and trivial pulmonic  insufficiency.  There is a small patent foramen ovale by Doppler.  Otherwise, TEE is normal.  There is no left atrial imaged clot.   We proceeded with elective cardioversion.  He was given additional  anesthesia with 80 mg IV Pentothal.  He then received two biphasic  synchronized DC shocks at 120 then 150 joules with subsequent conversion to  normal sinus rhythm and sinus bradycardia, rate 54 beats per minute.  There  were no complications.   IMPRESSION:  Successful TEE guided electrical cardioversion.      PMJ/MEDQ  D:  12/14/2004  T:  12/14/2004  Job:  [id number]   cc:   Vale Haven. Andrey Campanile, M.D.  [address]  [place]  [place] [address]  Fax: [phone]

## 2010-12-22 NOTE — H&P (Signed)
NAMEKARRINGTON, STUDNICKA NO.:  [id number]   MEDICAL RECORD NO.:  [id number]          PATIENT TYPE:  INP   LOCATION:  1826                         FACILITY:  [facility]   PHYSICIAN:  Peter M. [place], M.D.  DATE OF BIRTH:  Jun 21, 1941   DATE OF ADMISSION:  12/08/2004  DATE OF DISCHARGE:                                HISTORY & PHYSICAL   HISTORY OF PRESENT ILLNESS:  Mr. Tedesco is a 70 year old white male with  history of hypertension who presents with a four-day history of increasing  weakness and fatigue.  This morning he felt worse and noted a fever and so  he went to [facility].  There, he was found to have a temperature of 101.4  and was noted to be atrial fibrillation with rapid ventricular response.  He  was transferred to our emergency department.  The patient denies any chest  pain, shortness of breath, or palpitations.  He has had no dizziness.  He  has had no recent URI symptoms.  He has had no dysuria.  He has had no prior  cardiac history.   PAST MEDICAL HISTORY:  1.  Hypertension.  2.  Chronic dermatitis.  3.  Previous surgery for wisdom tooth removal and ingrown toenail removal.   MEDICATIONS:  1.  Norvasc 10 mg daily.  2.  HCTZ 25 mg daily.   He has no known allergies.   SOCIAL HISTORY:  He operates a salvage yard for automobiles.  He is married.  He has two daughters and 10 grandchildren.  Denies tobacco or alcohol use.   FAMILY HISTORY:  Mother is elderly and has a pacemaker.  Otherwise, no  family history of cardiac disease.   REVIEW OF SYSTEMS:  Are as noted in HPI. Otherwise, negative.   PHYSICAL EXAMINATION:  GENERAL:  The patient is a well-developed white male  in no apparent distress.  VITAL SIGNS:  Pulse 100 and irregular, blood pressure 115/71, temperature  99.6, saturation 100%, respirations are unlabored.  HEENT:  Normocephalic, atraumatic.  Pupils equal, round, reactive.  Sclerae  are clear.  Oropharynx is clear.  NECK:  Without  jugular venous distention, adenopathy, thyromegaly, or  bruits.  LUNGS:  Clear.  CARDIAC:  Reveals irregular rate and rhythm without gallop, murmur, or rub.  ABDOMEN:  Soft, nontender without masses or bruits.  EXTREMITIES:  Without edema.  Pulses are 2+ and symmetric.  There is no  phlebitis.  NEUROLOGIC:  Intact.  SKIN:  Reveals chronic dermatitis with a scaly, papular rash over his arms,  legs, and neck.   LABORATORY DATA:  Chest x-ray shows mild cardiomegaly. No active disease.  ECG shows atrial fibrillation, rapid ventricular response, and nonspecific  ST-T wave changes.  White count 7100, hemoglobin 13.5, hematocrit 40.2,  platelets 178,000, pro time is 14.2, sodium 136, potassium 2.6, chloride 97,  CO2 31, BUN 17, creatinine 1.2, glucose 124.  LFTs are normal with exception  of total bili of 1.7.   IMPRESSION:  1.  Atrial fibrillation with rapid ventricular response in new onset.  2.  Fever.  3.  Severe  hypokalemia.  4.  Hypertension.   PLAN:  The patient will be admitted to rule out myocardial infarction.  Obtain blood and urine cultures.  Will check TSH and T4.  The patient will  be treated with rate control with IV Cardizem and start oral beta blockade.  Will initiate anticoagulation with subcu Lovenox and then transition to oral  Coumadin.  Will obtain an echocardiogram.      PMJ/MEDQ  D:  12/08/2004  T:  12/08/2004  Job:  [id number]

## 2011-04-26 ENCOUNTER — Encounter: Payer: Self-pay | Admitting: *Deleted

## 2011-05-07 ENCOUNTER — Ambulatory Visit (INDEPENDENT_AMBULATORY_CARE_PROVIDER_SITE_OTHER): Payer: Medicare Other | Admitting: Cardiology

## 2011-05-07 ENCOUNTER — Encounter: Payer: Self-pay | Admitting: Cardiology

## 2011-05-07 VITALS — BP 160/72 | HR 50 | Ht 75.0 in | Wt 216.4 lb

## 2011-05-07 DIAGNOSIS — I10 Essential (primary) hypertension: Secondary | ICD-10-CM

## 2011-05-07 DIAGNOSIS — I4891 Unspecified atrial fibrillation: Secondary | ICD-10-CM

## 2011-05-07 NOTE — Patient Instructions (Signed)
Continue your current medications.  I will see you again in 1 year.   

## 2011-05-08 ENCOUNTER — Encounter: Payer: Self-pay | Admitting: Family Medicine

## 2011-05-08 ENCOUNTER — Ambulatory Visit (INDEPENDENT_AMBULATORY_CARE_PROVIDER_SITE_OTHER): Payer: Medicare Other | Admitting: Family Medicine

## 2011-05-08 ENCOUNTER — Ambulatory Visit: Payer: Medicare Other | Admitting: Family Medicine

## 2011-05-08 VITALS — BP 154/82 | Temp 98.8°F | Wt 215.6 lb

## 2011-05-08 DIAGNOSIS — I10 Essential (primary) hypertension: Secondary | ICD-10-CM

## 2011-05-08 LAB — BASIC METABOLIC PANEL
CO2: 29 mEq/L (ref 19–32)
Calcium: 9 mg/dL (ref 8.4–10.5)
Chloride: 107 mEq/L (ref 96–112)
Sodium: 143 mEq/L (ref 135–145)

## 2011-05-08 NOTE — Progress Notes (Signed)
  Subjective:    Patient ID: [name], male    DOB: February 01, 1941, 70 y.o.   MRN: [id number]  HPI HTN- chronic problem for pt, has had cuff correlated w/ office readings.  Home readings range 111-133/49-70.  No CP, SOB, HAs, visual changes, edema.   Review of Systems For ROS see HPI     Objective:   Physical Exam  Constitutional: He is oriented to person, place, and time. He appears well-developed and well-nourished. No distress.  HENT:  Head: Normocephalic and atraumatic.  Eyes: Conjunctivae and EOM are normal. Pupils are equal, round, and reactive to light.  Neck: Normal range of motion. Neck supple. No thyromegaly present.  Cardiovascular: Normal rate, regular rhythm, normal heart sounds and intact distal pulses.   No murmur heard. Pulmonary/Chest: Effort normal and breath sounds normal. No respiratory distress.  Abdominal: Soft. Bowel sounds are normal. He exhibits no distension.  Musculoskeletal: He exhibits no edema.  Lymphadenopathy:    He has no cervical adenopathy.  Neurological: He is alert and oriented to person, place, and time. No cranial nerve deficit.  Skin: Skin is warm and dry.  Psychiatric: He has a normal mood and affect. His behavior is normal.          Assessment & Plan:

## 2011-05-08 NOTE — Assessment & Plan Note (Signed)
Blood pressure is elevated today but his readings from his home blood pressure monitor have been excellent. We will continue to monitor.

## 2011-05-08 NOTE — Progress Notes (Signed)
[name] Date of Birth: [birth year]   History of Present Illness: [name] is seen today for followup. He has a history of atrial fibrillation. His last episode of atrial fibrillation was in 2007. He has been on chronic sotalol therapy. He states he continues to feel very well. He's had no palpitations, dizziness, or fatigue. He denies any chest pain or shortness of breath. He exercises daily.  Current Outpatient Prescriptions on File Prior to Visit  Medication Sig Dispense Refill  . Ascorbic Acid (VITAMIN C) 500 MG tablet Take 500 mg by mouth daily.        Marland Kitchen aspirin 81 MG tablet Take 81 mg by mouth daily.        . benazepril (LOTENSIN) 10 MG tablet Take 1 tablet (10 mg total) by mouth daily.  90 tablet  2  . diltiazem (DILACOR XR) 240 MG 24 hr capsule Take 240 mg by mouth daily.        . Magnesium Oxide 400 MG CAPS Take by mouth daily.        . Omega-3 Fatty Acids (FISH OIL) 1000 MG CAPS Take by mouth daily.        . potassium chloride SA (KLOR-CON M20) 20 MEQ tablet Take 20 mEq by mouth daily.        . Saw Palmetto, Serenoa repens, (SAW PALMETTO BERRIES) 540 MG CAPS Take 1,080 mg by mouth daily.        . sildenafil (VIAGRA) 100 MG tablet Take 100 mg by mouth daily as needed.        . sotalol AF (BETAPACE AF) 80 MG TABS tablet Take by mouth 2 (two) times daily.        . vitamin E 400 UNIT capsule Take 400 Units by mouth daily.          Allergies  Allergen Reactions  . Cipro Iv (Ciprofloxacin)   . Hctz (Hydrochlorothiazide) Photosensitivity    Past Medical History  Diagnosis Date  . Atrial fibrillation   . Hypertension   . Erectile dysfunction   . Hypokalemia   . Acute renal insufficiency     resolved  . Meningioma     Probable 13 x 12 mm meningioma in right frontal region  . Thrombocytopenia     improved  . Chronic dermatitis     Past Surgical History  Procedure Date  . Tonsillectomy   . Eye surgery     spot removed from cornea  . Toenail excision     ingrown  toenail removal  . Tee with cardioversion 12/14/2004    Successful TEE guided electrical cardioversion. -- showed mild mitral insufficiency and trace pulmonic insufficiency     History  Smoking status  . Former Smoker -- 1.0 packs/day for 10 years  . Types: Cigarettes  . Quit date: 04/25/1970  Smokeless tobacco  . Not on file    History  Alcohol Use No    Family History  Problem Relation Age of Onset  . Heart disease Mother     with pacemaker  . Diabetes Mother   . Asthma Mother   . Hypertension Father   . Cancer Father     pancreatic cancer    Review of Systems: As noted in history of present illness.  All other systems were reviewed and are negative.  Physical Exam: BP 160/72  Pulse 50  Ht 6\' 3"  (1.905 m)  Wt 216 lb 6.4 oz (98.158 kg)  BMI 27.05 kg/m2 The patient is alert  and oriented x 3.  The mood and affect are normal.  The skin is warm and dry.  Color is normal.  The HEENT exam reveals that the sclera are nonicteric.  The mucous membranes are moist.  The carotids are 2+ without bruits.  There is no thyromegaly.  There is no JVD.  The lungs are clear.  The chest wall is non tender.  The heart exam reveals a regular rate with a normal S1 and S2.  There are no murmurs, gallops, or rubs.  The PMI is not displaced.   Abdominal exam reveals good bowel sounds.  There is no guarding or rebound.  There is no hepatosplenomegaly or tenderness.  There are no masses.  Exam of the legs reveal no clubbing, cyanosis, or edema.  He does have chronic dermatitis involving his upper extremities, trunk, and legs. The distal pulses are intact.  Cranial nerves II - XII are intact.  Motor and sensory functions are intact.  The gait is normal. LABORATORY DATA: ECG today demonstrates sinus bradycardia with a rate of 50 beats per minute. It is otherwise normal. QTC is 448 ms.  Assessment / Plan:

## 2011-05-08 NOTE — Patient Instructions (Signed)
Schedule your complete physical in April- do not eat before this appt Your home blood pressures look great- no med changes at this time [facility] notify you of your lab results Call with any questions or concerns You look great!  Keep it up! Happy Fall!

## 2011-05-08 NOTE — Assessment & Plan Note (Signed)
BP elevated in office but reports white coat HTN.  Home readings are excellent.  No adjustments in meds at this time to avoid dropping pt too low.  Reviewed supportive care and red flags that should prompt return.  Pt expressed understanding and is in agreement w/ plan.

## 2011-05-08 NOTE — Assessment & Plan Note (Signed)
Rhythm is very well controlled on a combination of diltiazem and sotalol. We will continue with his current therapy and followup again in one year.

## 2011-05-09 NOTE — Progress Notes (Signed)
Quick Note:    Labs mailed.  ______

## 2011-09-04 ENCOUNTER — Telehealth: Payer: Self-pay | Admitting: *Deleted

## 2011-09-04 MED ORDER — BENAZEPRIL HCL 10 MG PO TABS: 10.0000 mg | ORAL_TABLET | Freq: Every day | ORAL | Status: DC

## 2011-09-04 MED ORDER — DILTIAZEM HCL ER 240 MG PO CP24: 240.0000 mg | ORAL_CAPSULE | Freq: Every day | ORAL | Status: DC

## 2011-09-04 MED ORDER — POTASSIUM CHLORIDE CRYS ER 20 MEQ PO TBCR: 20.0000 meq | EXTENDED_RELEASE_TABLET | Freq: Every day | ORAL | Status: DC

## 2011-09-04 MED ORDER — SOTALOL HCL AF 80 MG PO TABS: 1.0000 | ORAL_TABLET | Freq: Two times a day (BID) | ORAL | Status: DC

## 2011-09-04 NOTE — Telephone Encounter (Signed)
Pt left vm stating his insurance changed and he needs 4 rx's sent to mail order [facility] per changed from [facility] as well as sending 10 day supply to [facility] on [address] to last til mail order comes in. Sent Potassium, Diltizam, Benazepril and Sotalol for 10days to [facility] and 90 days to [facility] and then called pt back to advise all has been sent to [facility] and [facility].pt understood

## 2011-09-21 ENCOUNTER — Ambulatory Visit (INDEPENDENT_AMBULATORY_CARE_PROVIDER_SITE_OTHER): Payer: Medicare Other | Admitting: Cardiology

## 2011-09-21 ENCOUNTER — Encounter: Payer: Self-pay | Admitting: Cardiology

## 2011-09-21 VITALS — BP 152/78 | HR 56 | Ht 75.0 in | Wt 213.0 lb

## 2011-09-21 DIAGNOSIS — I4891 Unspecified atrial fibrillation: Secondary | ICD-10-CM

## 2011-09-21 DIAGNOSIS — I10 Essential (primary) hypertension: Secondary | ICD-10-CM

## 2011-09-21 NOTE — Assessment & Plan Note (Signed)
He remains in sinus rhythm on sotalol. We will continue with aspirin.

## 2011-09-21 NOTE — Progress Notes (Signed)
[name] Date of Birth: 1941-03-14   History of Present Illness: [name] is seen today for followup. He has a history of atrial fibrillation. His last episode of atrial fibrillation was in 2007. He has been on chronic sotalol therapy. He states he continues to feel very well. He's had no palpitations, dizziness, or fatigue. He denies any chest pain or shortness of breath. He exercises daily walking 1 mile per day.  Current Outpatient Prescriptions on File Prior to Visit  Medication Sig Dispense Refill  . Ascorbic Acid (VITAMIN C) 500 MG tablet Take 500 mg by mouth daily.        Marland Kitchen aspirin 81 MG tablet Take 81 mg by mouth daily.        . benazepril (LOTENSIN) 10 MG tablet Take 1 tablet (10 mg total) by mouth daily.  10 tablet  0  . diltiazem (DILACOR XR) 240 MG 24 hr capsule Take 1 capsule (240 mg total) by mouth daily.  10 capsule  0  . Magnesium Oxide 400 MG CAPS Take by mouth daily.        . Omega-3 Fatty Acids (FISH OIL) 1000 MG CAPS Take by mouth daily.        . potassium chloride SA (KLOR-CON M20) 20 MEQ tablet Take 1 tablet (20 mEq total) by mouth daily.  10 tablet  0  . Saw Palmetto, Serenoa repens, (SAW PALMETTO BERRIES) 540 MG CAPS Take 1,080 mg by mouth daily.        . sildenafil (VIAGRA) 100 MG tablet Take 100 mg by mouth daily as needed.        . sotalol AF (BETAPACE AF) 80 MG TABS tablet Take 1 tablet (80 mg total) by mouth 2 (two) times daily.  10 each  0  . vitamin E 400 UNIT capsule Take 400 Units by mouth daily.          Allergies  Allergen Reactions  . Cipro Iv (Ciprofloxacin)   . Hctz (Hydrochlorothiazide) Photosensitivity    Past Medical History  Diagnosis Date  . Atrial fibrillation   . Hypertension   . Erectile dysfunction   . Hypokalemia   . Acute renal insufficiency     resolved  . Meningioma     Probable 13 x 12 mm meningioma in right frontal region  . Thrombocytopenia     improved  . Chronic dermatitis     Past Surgical History  Procedure  Date  . Tonsillectomy   . Eye surgery     spot removed from cornea  . Toenail excision     ingrown toenail removal  . Tee with cardioversion 12/14/2004    Successful TEE guided electrical cardioversion. -- showed mild mitral insufficiency and trace pulmonic insufficiency     History  Smoking status  . Former Smoker -- 1.0 packs/day for 10 years  . Types: Cigarettes  . Quit date: 04/25/1970  Smokeless tobacco  . Not on file    History  Alcohol Use No    Family History  Problem Relation Age of Onset  . Heart disease Mother     with pacemaker  . Diabetes Mother   . Asthma Mother   . Hypertension Father   . Cancer Father     pancreatic cancer    Review of Systems: As noted in history of present illness. He does complain of some knee discomfort. All other systems were reviewed and are negative.  Physical Exam: BP 152/78  Pulse 56  Ht 6'  3" (1.905 m)  Wt 213 lb (96.616 kg)  BMI 26.62 kg/m2 The patient is alert and oriented x 3.  The mood and affect are normal.  The skin is warm and dry.  Color is normal.  The HEENT exam reveals that the sclera are nonicteric.  The mucous membranes are moist.  The carotids are 2+ without bruits.  There is no thyromegaly.  There is no JVD.  The lungs are clear.  The chest wall is non tender.  The heart exam reveals a regular rate with a normal S1 and S2.  There are no murmurs, gallops, or rubs.  The PMI is not displaced.   Abdominal exam reveals good bowel sounds.  There is no guarding or rebound.  There is no hepatosplenomegaly or tenderness.  There are no masses.  Exam of the legs reveal no clubbing, cyanosis, or edema.  He does have chronic dermatitis involving his upper extremities, trunk, and legs. The distal pulses are intact.  Cranial nerves II - XII are intact.  Motor and sensory functions are intact.  The gait is normal. LABORATORY DATA: Renal function in October was normal.  Assessment / Plan:

## 2011-09-21 NOTE — Assessment & Plan Note (Signed)
Blood pressure is elevated today but he brings extensive records from home which indicated excellent pressure control. I made no change in his therapy today.

## 2011-09-21 NOTE — Patient Instructions (Signed)
Continue your current therapy.  I will see you again in 6 months with an Ecg.

## 2011-11-08 ENCOUNTER — Ambulatory Visit (INDEPENDENT_AMBULATORY_CARE_PROVIDER_SITE_OTHER): Payer: Medicare Other | Admitting: Family Medicine

## 2011-11-08 ENCOUNTER — Encounter: Payer: Self-pay | Admitting: *Deleted

## 2011-11-08 ENCOUNTER — Encounter: Payer: Self-pay | Admitting: Family Medicine

## 2011-11-08 VITALS — BP 142/85 | HR 86 | Temp 98.3°F | Ht 74.75 in | Wt 214.4 lb

## 2011-11-08 DIAGNOSIS — Z0000 Encounter for general adult medical examination without abnormal findings: Secondary | ICD-10-CM | POA: Insufficient documentation

## 2011-11-08 DIAGNOSIS — I10 Essential (primary) hypertension: Secondary | ICD-10-CM

## 2011-11-08 LAB — BASIC METABOLIC PANEL
BUN: 24 mg/dL — ABNORMAL HIGH (ref 6–23)
GFR: 59.28 mL/min — ABNORMAL LOW (ref >60.00)
Glucose, Bld: 98 mg/dL (ref 70–99)
Potassium: 3.8 mEq/L (ref 3.5–5.1)

## 2011-11-08 LAB — CBC WITH DIFFERENTIAL/PLATELET
Basophils Relative: 0.4 % (ref 0.0–3.0)
Eosinophils Relative: 1.2 % (ref 0.0–5.0)
HCT: 41.9 % (ref 39.0–52.0)
Hemoglobin: 14.4 g/dL (ref 13.0–17.0)
MCV: 92.2 fl (ref 78.0–100.0)
Monocytes Absolute: 0.5 10*3/uL (ref 0.1–1.0)
Neutrophils Relative %: 68 % (ref 43.0–77.0)
RBC: 4.54 Mil/uL (ref 4.22–5.81)
WBC: 5.3 10*3/uL (ref 4.5–10.5)

## 2011-11-08 LAB — HEPATIC FUNCTION PANEL
ALT: 15 U/L (ref 0–53)
AST: 22 U/L (ref 0–37)
Albumin: 4.4 g/dL (ref 3.5–5.2)

## 2011-11-08 LAB — LIPID PANEL: HDL: 47.1 mg/dL (ref >39.00)

## 2011-11-08 LAB — TSH: TSH: 1.36 u[IU]/mL (ref 0.35–5.50)

## 2011-11-08 NOTE — Progress Notes (Signed)
  Subjective:    Patient ID: [name], male    DOB: February 10, 1941, 71 y.o.   MRN: [id number]  HPI Here today for CPE.  Cards- [place], [facility]ro- [name].  UTD on colonoscopy  Risk Factors: HTN- chronic problem, on benazepril, dilt, sotalol.  Home BPs range 111-135/49-74.  Denies CP, SOB, HAs, visual changes, edema.  Fall Risk: low risk Physical Activity: walking on treadmill regularly Depression: asymptomatic Hearing: normal to conversational tones, mildly diminished to whispered voice ADL's: independent Cognitive: normal linear thought process, memory and attention intact Home Safety: safe at home, lives w/ wife Height, Weight, BMI, Visual Acuity: see vitals, vision corrected to 20/20 w/ glasses Counseling: UTD on colonoscopy and urology. Labs Ordered: See A&P Care Plan: See A&P    Review of Systems Patient reports no vision/hearing changes, anorexia, fever ,adenopathy, persistant/recurrent hoarseness, swallowing issues, chest pain, palpitations, edema, persistant/recurrent cough, hemoptysis, dyspnea (rest,exertional, paroxysmal nocturnal), gastrointestinal  bleeding (melena, rectal bleeding), abdominal pain, excessive heart burn, GU symptoms (dysuria, hematuria, voiding/incontinence issues) syncope, focal weakness, memory loss, numbness & tingling, skin/hair/nail changes, depression, anxiety, abnormal bruising/bleeding, musculoskeletal symptoms/signs.     Objective:   Physical Exam BP 142/85  Pulse 86  Temp(Src) 98.3 F (36.8 C) (Oral)  Ht 6' 2.75" (1.899 m)  Wt 214 lb 6.4 oz (97.251 kg)  BMI 26.98 kg/m2  SpO2 99%  General Appearance:    Alert, cooperative, no distress, appears stated age  Head:    Normocephalic, without obvious abnormality, atraumatic  Eyes:    PERRL, conjunctiva/corneas clear, EOM's intact, fundi    benign, both eyes       Ears:    Normal TM's and external ear canals, both ears  Nose:   Nares normal, septum midline, mucosa normal, no drainage   or sinus  tenderness  Throat:   Lips, mucosa, and tongue normal; teeth and gums normal  Neck:   Supple, symmetrical, trachea midline, no adenopathy;       thyroid:  No enlargement/tenderness/nodules  Back:     Symmetric, no curvature, ROM normal, no CVA tenderness  Lungs:     Clear to auscultation bilaterally, respirations unlabored  Chest wall:    No tenderness or deformity  Heart:    Regular rate and rhythm, S1 and S2 normal, no murmur, rub   or gallop  Abdomen:     Soft, non-tender, bowel sounds active all four quadrants,    no masses, no organomegaly  Genitalia:    Deferred to urology  Rectal:    Extremities:   Extremities normal, atraumatic, no cyanosis or edema  Pulses:   2+ and symmetric all extremities  Skin:   Skin color, texture, turgor normal, no rashes or lesions  Lymph nodes:   Cervical, supraclavicular, and axillary nodes normal  Neurologic:   CNII-XII intact. Normal strength, sensation and reflexes      throughout          Assessment & Plan:

## 2011-11-08 NOTE — Patient Instructions (Signed)
Follow up in 6 months to recheck BP You look great!  Keep up the good work! We'll notify you of your lab results and make any changes if needed Call with any questions or concerns Happy Spring!!!

## 2011-11-18 NOTE — Assessment & Plan Note (Signed)
Chronic problem.  Asymptomatic.  Pt's home readings are always better than office readings.  No changes.

## 2011-11-18 NOTE — Assessment & Plan Note (Signed)
Pt's PE WNL.  UTD on health maintenance.  Check labs.  Anticipatory guidance provided.  

## 2012-02-04 ENCOUNTER — Other Ambulatory Visit: Payer: Self-pay | Admitting: *Deleted

## 2012-02-04 NOTE — Telephone Encounter (Signed)
Pt's wife left msg on triage vmail requesting a refill on meds. She asked for a call back so she can tell you which meds. [phone].

## 2012-02-05 MED ORDER — BENAZEPRIL HCL 10 MG PO TABS: 10.0000 mg | ORAL_TABLET | Freq: Every day | ORAL | Status: DC

## 2012-02-05 MED ORDER — POTASSIUM CHLORIDE CRYS ER 20 MEQ PO TBCR: 20.0000 meq | EXTENDED_RELEASE_TABLET | Freq: Every day | ORAL | Status: DC

## 2012-02-05 MED ORDER — SOTALOL HCL AF 80 MG PO TABS: 1.0000 | ORAL_TABLET | Freq: Two times a day (BID) | ORAL | Status: DC

## 2012-02-05 MED ORDER — DILTIAZEM HCL ER 240 MG PO CP24: 240.0000 mg | ORAL_CAPSULE | Freq: Every day | ORAL | Status: DC

## 2012-02-05 NOTE — Telephone Encounter (Signed)
Pt wife called back because she had not heard anything in reference to VM she left on yesterday. Pt wife indicated that they are using mail order but will be out of med today and need Rx sent to local pharmacy. Rx sent

## 2012-02-06 ENCOUNTER — Other Ambulatory Visit: Payer: Self-pay | Admitting: *Deleted

## 2012-02-06 MED ORDER — SOTALOL HCL AF 80 MG PO TABS: 1.0000 | ORAL_TABLET | Freq: Two times a day (BID) | ORAL | Status: DC

## 2012-03-25 ENCOUNTER — Ambulatory Visit (INDEPENDENT_AMBULATORY_CARE_PROVIDER_SITE_OTHER): Payer: Medicare Other | Admitting: Cardiology

## 2012-03-25 ENCOUNTER — Encounter: Payer: Self-pay | Admitting: Cardiology

## 2012-03-25 VITALS — BP 174/90 | HR 51 | Ht 75.0 in | Wt 218.8 lb

## 2012-03-25 DIAGNOSIS — I10 Essential (primary) hypertension: Secondary | ICD-10-CM

## 2012-03-25 DIAGNOSIS — I4891 Unspecified atrial fibrillation: Secondary | ICD-10-CM

## 2012-03-25 NOTE — Patient Instructions (Signed)
Reduce your sotalol dose to 40 mg twice a day.  Continue your other medication  I will see you again in 6 months

## 2012-03-25 NOTE — Progress Notes (Signed)
[name] Date of Birth: 12-08-1940   History of Present Illness: [name] is seen today for followup. He has a history of atrial fibrillation. His last episode of atrial fibrillation was in 2007. He has been on chronic sotalol therapy. He states he continues to feel very well. He's had no palpitations, dizziness, or fatigue. He denies any chest pain or shortness of breath. He brings records of his blood pressure readings at home which have run from 108-143 systolic and diastolics between 60 and 78. Pulse rate ranges from 43-55.  Current Outpatient Prescriptions on File Prior to Visit  Medication Sig Dispense Refill  . Ascorbic Acid (VITAMIN C) 500 MG tablet Take 500 mg by mouth daily.        Marland Kitchen aspirin 81 MG tablet Take 81 mg by mouth daily.        . benazepril (LOTENSIN) 10 MG tablet Take 1 tablet (10 mg total) by mouth daily.  10 tablet  0  . Cholecalciferol (VITAMIN D-3 PO) Take 5,000 mg by mouth daily.      Marland Kitchen diltiazem (DILACOR XR) 240 MG 24 hr capsule Take 1 capsule (240 mg total) by mouth daily.  10 capsule  0  . Magnesium Oxide 400 MG CAPS Take by mouth daily.        . Omega-3 Fatty Acids (FISH OIL) 1000 MG CAPS Take by mouth daily.        . potassium chloride SA (KLOR-CON M20) 20 MEQ tablet Take 1 tablet (20 mEq total) by mouth daily.  10 tablet  0  . Saw Palmetto, Serenoa repens, (SAW PALMETTO BERRIES) 540 MG CAPS Take 1,080 mg by mouth daily.        . sildenafil (VIAGRA) 100 MG tablet Take 100 mg by mouth daily as needed.        . sotalol AF (BETAPACE AF) 80 MG TABS tablet Take 1 tablet (80 mg total) by mouth 2 (two) times daily.  20 each  0  . vitamin E 400 UNIT capsule Take 400 Units by mouth daily.          Allergies  Allergen Reactions  . Cipro Iv (Ciprofloxacin)   . Hctz (Hydrochlorothiazide) Photosensitivity    Past Medical History  Diagnosis Date  . Atrial fibrillation   . Hypertension   . Erectile dysfunction   . Hypokalemia   . Acute renal insufficiency       resolved  . Meningioma     Probable 13 x 12 mm meningioma in right frontal region  . Thrombocytopenia     improved  . Chronic dermatitis     Past Surgical History  Procedure Date  . Tonsillectomy   . Eye surgery     spot removed from cornea  . Toenail excision     ingrown toenail removal  . Tee with cardioversion 12/14/2004    Successful TEE guided electrical cardioversion. -- showed mild mitral insufficiency and trace pulmonic insufficiency     History  Smoking status  . Former Smoker -- 1.0 packs/day for 10 years  . Types: Cigarettes  . Quit date: 04/25/1970  Smokeless tobacco  . Not on file    History  Alcohol Use No    Family History  Problem Relation Age of Onset  . Heart disease Mother     with pacemaker  . Diabetes Mother   . Asthma Mother   . Hypertension Father   . Cancer Father     pancreatic cancer  Review of Systems: As noted in history of present illness.  All other systems were reviewed and are negative.  Physical Exam: BP 174/90  Pulse 51  Ht 6\' 3"  (1.905 m)  Wt 99.247 kg (218 lb 12.8 oz)  BMI 27.35 kg/m2 The patient is alert and oriented x 3.  The mood and affect are normal.  The skin is warm and dry.  Color is normal.  The HEENT exam reveals that the sclera are nonicteric.  The mucous membranes are moist.  The carotids are 2+ without bruits.  There is no thyromegaly.  There is no JVD.  The lungs are clear.  The chest wall is non tender.  The heart exam reveals a regular rate with a normal S1 and S2.  There are no murmurs, gallops, or rubs.  The PMI is not displaced.   Abdominal exam reveals good bowel sounds.  There is no guarding or rebound.  There is no hepatosplenomegaly or tenderness.  There are no masses.  Exam of the legs reveal no clubbing, cyanosis, or edema.  He does have chronic dermatitis involving his upper extremities, trunk, and legs. The distal pulses are intact.  Cranial nerves II - XII are intact.  Motor and sensory functions  are intact.  The gait is normal. LABORATORY DATA: ECG today demonstrates sinus bradycardia with a rate of 53 beats per minute. It is otherwise normal. QTC is 461 ms.  Assessment / Plan: 1. Atrial fibrillation. Well controlled on sotalol. I am concerned about his bradycardia. Since he has not had any recurrent atrial fibrillation for the past 6 years we will reduce his sotalol dose to 40 mg twice a day. If he remains significantly bradycardic we can stop his diltiazem but we will need to substitute another medication for his blood pressure.  2. Hypertension. Blood pressure is elevated today but his blood pressure readings from home have been acceptable. We will continue to monitor at this time.

## 2012-03-31 ENCOUNTER — Other Ambulatory Visit: Payer: Self-pay | Admitting: Family Medicine

## 2012-03-31 NOTE — Telephone Encounter (Signed)
Refills x 4 requesting 90-day supply  last ov 4.4.13 V70  1-Benazepril HCl (Tab) 10 MG Take 1 tablet (10 mg total) by mouth daily-last wrt 7.2.13 #10  2-Diltiazem ER CAP 240/24 240 MG Take 1 capsule (240 mg total) by mouth daily-last wrt 7.2.13#10  3-,KLOR-CON 20 MEQ Take 1 tablet (20 mEq total) by mouth daily-last wrt 7.2.13 #10  4-Sotalol HCl AF (Tab) AF 80 MG Take 1 tablet (80 mg total) by mouth 2 (two) times daily.-last wrt 7.3.13 #20   Send all 4-to [facility]

## 2012-04-01 MED ORDER — SOTALOL HCL AF 80 MG PO TABS: 1.0000 | ORAL_TABLET | Freq: Two times a day (BID) | ORAL | Status: DC

## 2012-04-01 MED ORDER — DILTIAZEM HCL ER 240 MG PO CP24: 240.0000 mg | ORAL_CAPSULE | Freq: Every day | ORAL | Status: DC

## 2012-04-01 MED ORDER — POTASSIUM CHLORIDE CRYS ER 20 MEQ PO TBCR: 20.0000 meq | EXTENDED_RELEASE_TABLET | Freq: Every day | ORAL | Status: DC

## 2012-04-01 MED ORDER — BENAZEPRIL HCL 10 MG PO TABS: 10.0000 mg | ORAL_TABLET | Freq: Every day | ORAL | Status: DC

## 2012-04-01 NOTE — Telephone Encounter (Signed)
rx sent to pharmacy by e-script per pt upcoming apt noted

## 2012-04-03 NOTE — Telephone Encounter (Signed)
Received a 2nd request today on these can you send again please?

## 2012-04-03 NOTE — Telephone Encounter (Addendum)
Called [facility] to clairfy if they received the refills for the medication requested, spoke to rep Christiane Ha advised they did receive the refills for the pt and will process them to send them out to the home noted a delivery time of 5-7 business days, confirmation number [phone]

## 2012-05-09 ENCOUNTER — Ambulatory Visit: Payer: Medicare Other | Admitting: Family Medicine

## 2012-05-13 ENCOUNTER — Encounter: Payer: Self-pay | Admitting: Family Medicine

## 2012-05-13 ENCOUNTER — Ambulatory Visit (INDEPENDENT_AMBULATORY_CARE_PROVIDER_SITE_OTHER): Payer: Medicare Other | Admitting: Family Medicine

## 2012-05-13 VITALS — BP 127/79 | HR 58 | Temp 97.9°F | Ht 73.75 in | Wt 215.6 lb

## 2012-05-13 DIAGNOSIS — I10 Essential (primary) hypertension: Secondary | ICD-10-CM

## 2012-05-13 DIAGNOSIS — Z23 Encounter for immunization: Secondary | ICD-10-CM

## 2012-05-13 LAB — BASIC METABOLIC PANEL
BUN: 32 mg/dL — ABNORMAL HIGH (ref 6–23)
Chloride: 109 mEq/L (ref 96–112)
Glucose, Bld: 99 mg/dL (ref 70–99)
Potassium: 4 mEq/L (ref 3.5–5.1)

## 2012-05-13 NOTE — Progress Notes (Signed)
  Subjective:    Patient ID: [name], male    DOB: [birth year], 71 y.o.   MRN: [id number]  HPI HTN- chronic problem, Dr Swaziland decreased Sotalol to 1/2 tab BID due to low HRl.  BP looks great.  Also on Benazepril, Dilt.  No CP, SOB, HAs, visual changes, edema.   Review of Systems For ROS see HPI     Objective:   Physical Exam  Vitals reviewed. Constitutional: He is oriented to person, place, and time. He appears well-developed and well-nourished. No distress.  HENT:  Head: Normocephalic and atraumatic.  Eyes: Conjunctivae normal and EOM are normal. Pupils are equal, round, and reactive to light.  Neck: Normal range of motion. Neck supple. No thyromegaly present.  Cardiovascular: Normal rate, regular rhythm, normal heart sounds and intact distal pulses.   No murmur heard. Pulmonary/Chest: Effort normal and breath sounds normal. No respiratory distress.  Abdominal: Soft. Bowel sounds are normal. He exhibits no distension.  Musculoskeletal: He exhibits no edema.  Lymphadenopathy:    He has no cervical adenopathy.  Neurological: He is alert and oriented to person, place, and time. No cranial nerve deficit.  Skin: Skin is warm and dry.  Psychiatric: He has a normal mood and affect. His behavior is normal.          Assessment & Plan:

## 2012-05-13 NOTE — Assessment & Plan Note (Signed)
Chronic problem, well controlled.  Asymptomatic.  Check BMP.  No anticipated changes.

## 2012-05-13 NOTE — Patient Instructions (Addendum)
Schedule your complete physical for April Keep up the good work!  You look great!!! We'll notify you of your lab results and make any changes if needed Call with any questions or concerns Happy Fall!!!

## 2012-05-15 ENCOUNTER — Telehealth: Payer: Self-pay

## 2012-05-15 NOTE — Telephone Encounter (Signed)
Spoke to pt advising lab results, scheduled lab appt, and mailed results.       MW

## 2012-05-21 ENCOUNTER — Other Ambulatory Visit (INDEPENDENT_AMBULATORY_CARE_PROVIDER_SITE_OTHER): Payer: Medicare Other

## 2012-05-21 DIAGNOSIS — R748 Abnormal levels of other serum enzymes: Secondary | ICD-10-CM

## 2012-05-21 LAB — BASIC METABOLIC PANEL
CO2: 27 mEq/L (ref 19–32)
Calcium: 9 mg/dL (ref 8.4–10.5)
Chloride: 108 mEq/L (ref 96–112)
Glucose, Bld: 103 mg/dL — ABNORMAL HIGH (ref 70–99)
Sodium: 141 mEq/L (ref 135–145)

## 2012-08-12 ENCOUNTER — Other Ambulatory Visit: Payer: Self-pay | Admitting: *Deleted

## 2012-08-12 MED ORDER — DILTIAZEM HCL ER 240 MG PO CP24: 240.0000 mg | ORAL_CAPSULE | Freq: Every day | ORAL | Status: DC

## 2012-08-12 MED ORDER — BENAZEPRIL HCL 10 MG PO TABS: 10.0000 mg | ORAL_TABLET | Freq: Every day | ORAL | Status: DC

## 2012-08-12 MED ORDER — POTASSIUM CHLORIDE CRYS ER 20 MEQ PO TBCR: 20.0000 meq | EXTENDED_RELEASE_TABLET | Freq: Every day | ORAL | Status: DC

## 2012-08-12 NOTE — Telephone Encounter (Signed)
Rx sent 

## 2012-08-22 ENCOUNTER — Other Ambulatory Visit (INDEPENDENT_AMBULATORY_CARE_PROVIDER_SITE_OTHER): Payer: Medicare Other

## 2012-08-22 DIAGNOSIS — I10 Essential (primary) hypertension: Secondary | ICD-10-CM

## 2012-08-22 LAB — BASIC METABOLIC PANEL
BUN: 29 mg/dL — ABNORMAL HIGH (ref 6–23)
CO2: 28 mEq/L (ref 19–32)
Chloride: 107 mEq/L (ref 96–112)
Creatinine, Ser: 1.5 mg/dL (ref 0.4–1.5)
Glucose, Bld: 96 mg/dL (ref 70–99)

## 2012-08-27 ENCOUNTER — Telehealth: Payer: Self-pay | Admitting: *Deleted

## 2012-08-27 MED ORDER — DILTIAZEM HCL ER 240 MG PO CP24: 240.0000 mg | ORAL_CAPSULE | Freq: Every day | ORAL | Status: DC

## 2012-08-27 MED ORDER — SOTALOL HCL AF 80 MG PO TABS: 1.0000 | ORAL_TABLET | Freq: Two times a day (BID) | ORAL | Status: DC

## 2012-08-27 NOTE — Telephone Encounter (Signed)
Pt left msg on triage stating that his prescriptions were sent to the wrong pharmacy. Called pt back. dilitizam & solotol needs to be sent to [facility]. Re-sent rx.

## 2012-09-12 ENCOUNTER — Telehealth: Payer: Self-pay | Admitting: Family Medicine

## 2012-09-12 DIAGNOSIS — R748 Abnormal levels of other serum enzymes: Secondary | ICD-10-CM

## 2012-09-12 NOTE — Telephone Encounter (Signed)
Patient would like to know status of nephrology referral. CB# [phone]

## 2012-09-12 NOTE — Telephone Encounter (Signed)
Pt states that he came in office on Wednesday and spoke to someone who indicated that she would put in referral. Pt advise that referral has been placed awaiting appt info.

## 2012-11-11 ENCOUNTER — Ambulatory Visit (INDEPENDENT_AMBULATORY_CARE_PROVIDER_SITE_OTHER): Payer: Medicare Other | Admitting: Family Medicine

## 2012-11-11 ENCOUNTER — Encounter: Payer: Self-pay | Admitting: Family Medicine

## 2012-11-11 VITALS — BP 160/120 | HR 68 | Temp 98.3°F | Ht 74.25 in | Wt 216.2 lb

## 2012-11-11 DIAGNOSIS — I10 Essential (primary) hypertension: Secondary | ICD-10-CM

## 2012-11-11 DIAGNOSIS — Z1331 Encounter for screening for depression: Secondary | ICD-10-CM

## 2012-11-11 DIAGNOSIS — Z0000 Encounter for general adult medical examination without abnormal findings: Secondary | ICD-10-CM

## 2012-11-11 LAB — CBC WITH DIFFERENTIAL/PLATELET
Basophils Absolute: 0 10*3/uL (ref 0.0–0.1)
Eosinophils Absolute: 0 10*3/uL (ref 0.0–0.7)
HCT: 43 % (ref 39.0–52.0)
Hemoglobin: 14.4 g/dL (ref 13.0–17.0)
Lymphs Abs: 0.9 10*3/uL (ref 0.7–4.0)
MCHC: 33.5 g/dL (ref 30.0–36.0)
Monocytes Relative: 7.3 % (ref 3.0–12.0)
Neutro Abs: 4.3 10*3/uL (ref 1.4–7.7)
Platelets: 152 10*3/uL (ref 150.0–400.0)
RDW: 13.9 % (ref 11.5–14.6)

## 2012-11-11 LAB — LIPID PANEL
Cholesterol: 158 mg/dL (ref 0–200)
HDL: 38.9 mg/dL — ABNORMAL LOW (ref >39.00)
LDL Cholesterol: 91 mg/dL (ref 0–99)
Total CHOL/HDL Ratio: 4
Triglycerides: 140 mg/dL (ref 0.0–149.0)
VLDL: 28 mg/dL (ref 0.0–40.0)

## 2012-11-11 LAB — BASIC METABOLIC PANEL
Calcium: 9.7 mg/dL (ref 8.4–10.5)
Creatinine, Ser: 1.3 mg/dL (ref 0.4–1.5)
GFR: 56.53 mL/min — ABNORMAL LOW (ref >60.00)
Sodium: 145 mEq/L (ref 135–145)

## 2012-11-11 LAB — HEPATIC FUNCTION PANEL
Alkaline Phosphatase: 80 U/L (ref 39–117)
Bilirubin, Direct: 0.2 mg/dL (ref 0.0–0.3)
Total Bilirubin: 1.5 mg/dL — ABNORMAL HIGH (ref 0.3–1.2)

## 2012-11-11 NOTE — Assessment & Plan Note (Signed)
Pt's PE WNL w/ exception of BP (see below).  UTD on health maintenance.  Check labs.  Anticipatory guidance provided.

## 2012-11-11 NOTE — Patient Instructions (Addendum)
Follow up in 6 months to recheck BP You look great!  Keep up the good work!! [employer] notify you of your lab results and make any changes if needed We called [facility]- we'll let you know what they tell us! Call with any questions or concerns Thanks for the brownies! Happy Odis Luster!

## 2012-11-11 NOTE — Assessment & Plan Note (Signed)
Chronic problem, elevated today but pt admits to increased stress this AM.  No hx of recent BP elevation.  Pt to check BP at home and notify me if BP consistently higher than 140/90.  Reviewed supportive care and red flags that should prompt return.  Pt expressed understanding and is in agreement w/ plan.

## 2012-11-11 NOTE — Progress Notes (Signed)
  Subjective:    Patient ID: [name], male    DOB: 10-12-1940, 72 y.o.   MRN: [id number]  HPI Here today for CPE.  Risk Factors: HTN- chronic problem, typically well controlled.  Elevated today b/c he had to take a detour to get here and thought he was going to be late.  No CP, SOB, HAs, visual changes, edema.  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 Height, Weight, BMI, Visual Acuity: see vitals, vision corrected to 20/20 w/ cataract surgery. Counseling:  UTD on colonoscopy, following w/ urology [name])                                                               Labs Ordered: See A&P Care Plan: See A&P    Review of Systems Patient reports no vision/hearing changes, anorexia, fever ,adenopathy, persistant/recurrent hoarseness, swallowing issues, chest pain, palpitations, edema, persistant/recurrent cough, hemoptysis, dyspnea (rest,exertional, paroxysmal nocturnal), gastrointestinal  bleeding (melena, rectal bleeding), abdominal pain, excessive heart burn, GU symptoms (dysuria, hematuria, voiding/incontinence issues) syncope, focal weakness, memory loss, numbness & tingling, skin/hair/nail changes, depression, anxiety, abnormal bruising/bleeding, musculoskeletal symptoms/signs.     Objective:   Physical Exam BP 160/120  Pulse 68  Temp(Src) 98.3 F (36.8 C) (Oral)  Ht 6' 2.25" (1.886 m)  Wt 216 lb 3.2 oz (98.068 kg)  BMI 27.57 kg/m2  SpO2 98%  General Appearance:    Alert, cooperative, no distress, appears stated age  Head:    Normocephalic, without obvious abnormality, atraumatic  Eyes:    PERRL, conjunctiva/corneas clear, EOM's intact, fundi    benign, both  eyes       Ears:    Normal TM's and external ear canals, both ears  Nose:   Nares normal, septum midline, mucosa normal, no drainage   or sinus tenderness  Throat:   Lips, mucosa, and tongue normal; teeth and gums normal  Neck:   Supple, symmetrical, trachea midline, no adenopathy;       thyroid:  No enlargement/tenderness/nodules  Back:     Symmetric, no curvature, ROM normal, no CVA tenderness  Lungs:     Clear to auscultation bilaterally, respirations unlabored  Chest wall:    No tenderness or deformity  Heart:    Irregularly irregular, S1 and S2 normal, no murmur, rub   or gallop  Abdomen:     Soft, non-tender, bowel sounds active all four quadrants,    no masses, no organomegaly  Genitalia:    Deferred to urology  Rectal:    Extremities:   Extremities normal, atraumatic, no cyanosis or edema  Pulses:   2+ and symmetric all extremities  Skin:   Skin color, texture, turgor normal, no rashes or lesions  Lymph nodes:   Cervical, supraclavicular, and axillary nodes normal  Neurologic:   CNII-XII intact. Normal strength, sensation and reflexes      throughout          Assessment & Plan:

## 2012-12-04 ENCOUNTER — Telehealth: Payer: Self-pay | Admitting: Family Medicine

## 2012-12-04 NOTE — Telephone Encounter (Signed)
Patient states he would like this request expedited.

## 2012-12-05 MED ORDER — BENAZEPRIL HCL 10 MG PO TABS: 10.0000 mg | ORAL_TABLET | Freq: Every day | ORAL | Status: DC

## 2012-12-05 MED ORDER — DILTIAZEM HCL ER 240 MG PO CP24: 240.0000 mg | ORAL_CAPSULE | Freq: Every day | ORAL | Status: DC

## 2012-12-05 MED ORDER — POTASSIUM CHLORIDE CRYS ER 20 MEQ PO TBCR: 20.0000 meq | EXTENDED_RELEASE_TABLET | Freq: Every day | ORAL | Status: DC

## 2012-12-05 NOTE — Telephone Encounter (Signed)
Rx sent to the pharmacy by e-script.//AB/CMA 

## 2012-12-16 ENCOUNTER — Other Ambulatory Visit: Payer: Self-pay | Admitting: Nephrology

## 2012-12-19 ENCOUNTER — Ambulatory Visit
Admission: RE | Admit: 2012-12-19 | Discharge: 2012-12-19 | Disposition: A | Discharge status: Home / Self Care | Payer: Medicare Other | Source: Ambulatory Visit | Attending: Nephrology | Admitting: Nephrology

## 2013-01-16 ENCOUNTER — Ambulatory Visit: Payer: Medicare Other | Admitting: Cardiology

## 2013-03-27 ENCOUNTER — Encounter: Payer: Self-pay | Admitting: Cardiology

## 2013-03-27 ENCOUNTER — Ambulatory Visit (INDEPENDENT_AMBULATORY_CARE_PROVIDER_SITE_OTHER): Payer: Medicare Other | Admitting: Cardiology

## 2013-03-27 VITALS — BP 190/122 | HR 82 | Ht 74.5 in | Wt 209.8 lb

## 2013-03-27 DIAGNOSIS — I10 Essential (primary) hypertension: Secondary | ICD-10-CM

## 2013-03-27 DIAGNOSIS — I4891 Unspecified atrial fibrillation: Secondary | ICD-10-CM

## 2013-03-27 MED ORDER — RIVAROXABAN 20 MG PO TABS: 20.0000 mg | ORAL_TABLET | Freq: Every day | ORAL | Status: DC

## 2013-03-27 NOTE — Patient Instructions (Addendum)
Stop ASA and sotalol  Start Xarelto 20 mg daily for blood thinner  We will schedule you for an echocardiogram  I will see you in one month  Keep track of your blood pressure.

## 2013-03-27 NOTE — Progress Notes (Signed)
[name] Date of Birth: [birth year]   History of Present Illness: [name] is seen today for followup. He has a history of atrial fibrillation. His last episode of atrial fibrillation was in 2007. He has been on chronic sotalol therapy. On his visit one year ago we reduced his sotalol dose because of marked bradycardia. He states he has felt very well since then. He denies any symptoms of dyspnea, chest pain, lightheadedness, or palpitations. He did have cataract surgery which resulted in a significant improvement in his vision. He has lost 9 pounds since his last visit. His blood pressure readings at home have been well controlled.  Current Outpatient Prescriptions on File Prior to Visit  Medication Sig Dispense Refill  . benazepril (LOTENSIN) 10 MG tablet Take 1 tablet (10 mg total) by mouth daily.  90 tablet  1  . Cholecalciferol (VITAMIN D-3 PO) Take 5,000 mg by mouth daily.      Marland Kitchen diltiazem (DILACOR XR) 240 MG 24 hr capsule Take 1 capsule (240 mg total) by mouth daily.  90 capsule  1  . Multiple Vitamin (MULTIVITAMIN) tablet Take 1 tablet by mouth daily.      . Omega-3 Fatty Acids (FISH OIL) 1000 MG CAPS Take by mouth daily.        . potassium chloride SA (KLOR-CON M20) 20 MEQ tablet Take 1 tablet (20 mEq total) by mouth daily.  90 tablet  1  . Saw Palmetto, Serenoa repens, (SAW PALMETTO BERRIES) 540 MG CAPS Take 1,080 mg by mouth daily.        . sildenafil (VIAGRA) 100 MG tablet Take 100 mg by mouth daily as needed.         No current facility-administered medications on file prior to visit.    Allergies  Allergen Reactions  . Cipro Iv [Ciprofloxacin]   . Hctz [Hydrochlorothiazide] Photosensitivity    Past Medical History  Diagnosis Date  . Atrial fibrillation   . Hypertension   . Erectile dysfunction   . Hypokalemia   . Acute renal insufficiency     resolved  . Meningioma     Probable 13 x 12 mm meningioma in right frontal region  . Thrombocytopenia     improved   . Chronic dermatitis     Past Surgical History  Procedure Laterality Date  . Tonsillectomy    . Eye surgery      spot removed from cornea  . Toenail excision      ingrown toenail removal  . Tee with cardioversion  12/14/2004    Successful TEE guided electrical cardioversion. -- showed mild mitral insufficiency and trace pulmonic insufficiency     History  Smoking status  . Former Smoker -- 1.00 packs/day for 10 years  . Types: Cigarettes  . Quit date: 04/25/1970  Smokeless tobacco  . Not on file    History  Alcohol Use No    Family History  Problem Relation Age of Onset  . Heart disease Mother     with pacemaker  . Diabetes Mother   . Asthma Mother   . Hypertension Father   . Cancer Father     pancreatic cancer    Review of Systems: As noted in history of present illness.  All other systems were reviewed and are negative.  Physical Exam: BP 190/122  Pulse 82  Ht 6' 2.5" (1.892 m)  Wt 209 lb 12.8 oz (95.165 kg)  BMI 26.58 kg/m2 The patient is alert and oriented x 3.  The skin is warm and dry.    The HEENT exam is normal. The carotids are 2+ without bruits.  There is no thyromegaly.  There is no JVD.  The lungs are clear.   The heart exam reveals an irregular rate with a normal S1 and S2.  There are no murmurs, gallops, or rubs.  The PMI is not displaced.   Abdominal exam reveals good bowel sounds. There is no hepatosplenomegaly or tenderness.  There are no masses.  Exam of the legs reveal no clubbing, cyanosis, or edema.  He does have chronic dermatitis involving his upper extremities, trunk, and legs. The distal pulses are intact.  Cranial nerves II - XII are intact.  Motor and sensory functions are intact.  The gait is normal. LABORATORY DATA: ECG today demonstrates atrial fibrillation with a rate of 75 beats per minute. It is otherwise normal.   Assessment / Plan: 1. Atrial fibrillation. recurrent. Patient is asymptomatic. His rate is well controlled on  diltiazem. I recommended stopping sotalol at this point. We will update an echocardiogram. I recommended anticoagulation and we'll start him on Xarelto 20 mg daily. We'll stop aspirin. He is instructed to avoid nonsteroidal anti-inflammatory agents. I will followup again in one month. If he remains asymptomatic I would recommend a long-term strategy of rate control and anticoagulation.   2. Hypertension. Blood pressure is elevated today but his blood pressure readings from home have been acceptable. he is going to continue to monitor his blood pressure and we will review on his next visit. We will also check a CBC and basic metabolic panel at that time.

## 2013-03-30 ENCOUNTER — Telehealth: Payer: Self-pay | Admitting: Cardiology

## 2013-03-30 NOTE — Telephone Encounter (Signed)
Returned call to patient he stated his B/P has been elevated over the weekend since Dr.Jordan stopped sotalol.B/P 156/108 p 77,158/108 p 71,153/107 p 74.Dr.Jordan not in office today will send message to him for advice.

## 2013-03-30 NOTE — Telephone Encounter (Signed)
New problem   Pt need to speak to nurse concerning pt's high bp. Please call pt

## 2013-03-31 MED ORDER — BENAZEPRIL HCL 20 MG PO TABS: 20.0000 mg | ORAL_TABLET | Freq: Every day | ORAL | Status: DC

## 2013-03-31 NOTE — Telephone Encounter (Signed)
Returned call to patient Dr.Jordan advised to increase Lotensin to 20 mg daily.Continue diltiazem.Call back in 2 weeks to report B/P readings.

## 2013-03-31 NOTE — Telephone Encounter (Signed)
Increase lisinopril to 20 mg daily. Continue diltiazem. Report blood pressure readings in 1-2 weeks.  Peter Swaziland MD, [facility]

## 2013-03-31 NOTE — Addendum Note (Signed)
Addended by: Meda Klinefelter D on: 03/31/2013 08:31 AM   Modules accepted: Orders, Medications

## 2013-04-02 ENCOUNTER — Ambulatory Visit (HOSPITAL_COMMUNITY): Payer: Medicare Other | Attending: Cardiology | Admitting: Radiology

## 2013-04-02 DIAGNOSIS — I4891 Unspecified atrial fibrillation: Secondary | ICD-10-CM

## 2013-04-02 DIAGNOSIS — I10 Essential (primary) hypertension: Secondary | ICD-10-CM | POA: Insufficient documentation

## 2013-04-02 DIAGNOSIS — Z87891 Personal history of nicotine dependence: Secondary | ICD-10-CM | POA: Insufficient documentation

## 2013-04-02 DIAGNOSIS — I080 Rheumatic disorders of both mitral and aortic valves: Secondary | ICD-10-CM | POA: Insufficient documentation

## 2013-04-02 DIAGNOSIS — I079 Rheumatic tricuspid valve disease, unspecified: Secondary | ICD-10-CM | POA: Insufficient documentation

## 2013-04-02 NOTE — Progress Notes (Signed)
Echocardiogram performed.  

## 2013-04-08 ENCOUNTER — Telehealth: Payer: Self-pay | Admitting: Cardiology

## 2013-04-08 NOTE — Telephone Encounter (Signed)
New Problem  Pt calling for Echo results.

## 2013-04-08 NOTE — Telephone Encounter (Signed)
Returned call to patient echo results given. 

## 2013-04-29 ENCOUNTER — Ambulatory Visit (INDEPENDENT_AMBULATORY_CARE_PROVIDER_SITE_OTHER): Payer: Medicare Other | Admitting: Cardiology

## 2013-04-29 ENCOUNTER — Encounter: Payer: Self-pay | Admitting: Cardiology

## 2013-04-29 VITALS — BP 170/100 | HR 80 | Ht 74.0 in | Wt 209.0 lb

## 2013-04-29 DIAGNOSIS — I4891 Unspecified atrial fibrillation: Secondary | ICD-10-CM

## 2013-04-29 DIAGNOSIS — I10 Essential (primary) hypertension: Secondary | ICD-10-CM

## 2013-04-29 MED ORDER — BENAZEPRIL HCL 20 MG PO TABS: 20.0000 mg | ORAL_TABLET | Freq: Two times a day (BID) | ORAL | Status: DC

## 2013-04-29 NOTE — Patient Instructions (Signed)
Increase benazepril 20 mg twice.  Continue your other therapy  I will see you in 6 months.

## 2013-04-29 NOTE — Progress Notes (Signed)
[name] Date of Birth: 05/03/1941   History of Present Illness: [name] is seen today for followup of his atrial fibrillation. He has a history of atrial fibrillation dating back a number of years. He was placed on sotalol in 2007 which controlled his rhythm well. The dose did need to be reduced because of marked bradycardia. On his last visit he was found to be in recurrent atrial fibrillation. He was asymptomatic and a rate control strategy was taken. On followup today he reports he feels a little bit sluggish on some days but other days feels normal.He denies any symptoms of dyspnea, chest pain, lightheadedness, or palpitations. He has been monitoring his blood pressure at home and has some elevated readings into the 150-160 systolic range.  Current Outpatient Prescriptions on File Prior to Visit  Medication Sig Dispense Refill  . diltiazem (DILACOR XR) 240 MG 24 hr capsule Take 1 capsule (240 mg total) by mouth daily.  90 capsule  1  . Multiple Vitamin (MULTIVITAMIN) tablet Take 1 tablet by mouth daily.      . Omega-3 Fatty Acids (FISH OIL) 1000 MG CAPS Take by mouth daily.        . potassium chloride SA (KLOR-CON M20) 20 MEQ tablet Take 1 tablet (20 mEq total) by mouth daily.  90 tablet  1  . Rivaroxaban (XARELTO) 20 MG TABS tablet Take 1 tablet (20 mg total) by mouth daily with supper.  90 tablet  3  . Saw Palmetto, Serenoa repens, (SAW PALMETTO BERRIES) 540 MG CAPS Take 1,080 mg by mouth daily.        . sildenafil (VIAGRA) 100 MG tablet Take 100 mg by mouth daily as needed.         No current facility-administered medications on file prior to visit.    Allergies  Allergen Reactions  . Cipro Iv [Ciprofloxacin]   . Hctz [Hydrochlorothiazide] Photosensitivity    Past Medical History  Diagnosis Date  . Atrial fibrillation   . Hypertension   . Erectile dysfunction   . Hypokalemia   . Acute renal insufficiency     resolved  . Meningioma     Probable 13 x 12 mm meningioma  in right frontal region  . Thrombocytopenia     improved  . Chronic dermatitis     Past Surgical History  Procedure Laterality Date  . Tonsillectomy    . Eye surgery      spot removed from cornea  . Toenail excision      ingrown toenail removal  . Tee with cardioversion  12/14/2004    Successful TEE guided electrical cardioversion. -- showed mild mitral insufficiency and trace pulmonic insufficiency     History  Smoking status  . Former Smoker -- 1.00 packs/day for 10 years  . Types: Cigarettes  . Quit date: 04/25/1970  Smokeless tobacco  . Not on file    History  Alcohol Use No    Family History  Problem Relation Age of Onset  . Heart disease Mother     with pacemaker  . Diabetes Mother   . Asthma Mother   . Hypertension Father   . Cancer Father     pancreatic cancer    Review of Systems: As noted in history of present illness.  All other systems were reviewed and are negative.  Physical Exam: BP 170/100  Pulse 80  Ht 6\' 2"  (1.88 m)  Wt 94.802 kg (209 lb)  BMI 26.82 kg/m2 The patient is alert  and oriented x 3.   The skin is warm and dry.    The HEENT exam is normal. The carotids are 2+ without bruits.  There is no thyromegaly.  There is no JVD.  The lungs are clear.   The heart exam reveals an irregular rate with a normal S1 and S2.  There are no murmurs, gallops, or rubs.  The PMI is not displaced.   Abdominal exam reveals good bowel sounds. There is no hepatosplenomegaly or tenderness.  There are no masses.  Exam of the legs reveal no clubbing, cyanosis, or edema.  He does have chronic dermatitis involving his upper extremities, trunk, and legs. The distal pulses are intact.  Cranial nerves II - XII are intact.  Motor and sensory functions are intact.  The gait is normal. LABORATORY DATA: Echo:Study Conclusions  - Left ventricle: The cavity size was normal. There was mild focal basal hypertrophy of the septum. Systolic function was normal. The estimated  ejection fraction was in the range of 55% to 60%. Wall motion was normal; there were no regional wall motion abnormalities. - Aortic valve: Trivial regurgitation. - Mitral valve: Mild regurgitation. - Left atrium: The atrium was moderately to severely dilated. - Right atrium: The atrium was mildly dilated.     Assessment / Plan: 1. Atrial fibrillation. recurrent. Patient is minimally symptomatic. His rate is well controlled on diltiazem.I recommended anticoagulation and we'll continue him on Xarelto 20 mg daily. He is instructed to avoid nonsteroidal anti-inflammatory agents.  I would recommend a long-term strategy of rate control and anticoagulation. Given his moderate to severe left atrial enlargement I think it is unlikely that we would be able to maintain sinus rhythm long term even with antiarrhythmic drug therapy.  2. Hypertension. Blood pressure is elevated today I recommended increasing his benazepril to 20 mg twice a day. Continue diltiazem. I'll followup again in 6 months.

## 2013-04-30 ENCOUNTER — Encounter: Payer: Self-pay | Admitting: Cardiology

## 2013-05-13 ENCOUNTER — Ambulatory Visit (INDEPENDENT_AMBULATORY_CARE_PROVIDER_SITE_OTHER): Payer: Medicare Other | Admitting: Family Medicine

## 2013-05-13 ENCOUNTER — Encounter: Payer: Self-pay | Admitting: Family Medicine

## 2013-05-13 VITALS — BP 140/90 | HR 84 | Temp 98.2°F | Resp 17 | Wt 211.0 lb

## 2013-05-13 DIAGNOSIS — I10 Essential (primary) hypertension: Secondary | ICD-10-CM

## 2013-05-13 DIAGNOSIS — I4891 Unspecified atrial fibrillation: Secondary | ICD-10-CM

## 2013-05-13 DIAGNOSIS — Z23 Encounter for immunization: Secondary | ICD-10-CM

## 2013-05-13 LAB — BASIC METABOLIC PANEL
BUN: 22 mg/dL (ref 6–23)
CO2: 27 mEq/L (ref 19–32)
Chloride: 105 mEq/L (ref 96–112)
Creatinine, Ser: 1.3 mg/dL (ref 0.4–1.5)
Glucose, Bld: 95 mg/dL (ref 70–99)

## 2013-05-13 NOTE — Assessment & Plan Note (Signed)
Chronic problem.  Adequate control- better than previous.  Asymptomatic  Check BMP since ACE was doubled.  Reviewed supportive care and red flags that should prompt return.  Pt expressed understanding and is in agreement w/ plan.

## 2013-05-13 NOTE — Progress Notes (Signed)
  Subjective:    Patient ID: [name], male    DOB: 04/14/1941, 72 y.o.   MRN: [id number]  HPI HTN- BP was elevated at last cardiology visit and Benazepril was doubled to twice daily.  Pt was back in Afib in September but pt was asymptomatic.  Now on xarelto.  Rate controlled on Dilt.  No CP, SOB, HAs, edema.   Review of Systems For ROS see HPI     Objective:   Physical Exam  Vitals reviewed. Constitutional: He is oriented to person, place, and time. He appears well-developed and well-nourished. No distress.  HENT:  Head: Normocephalic and atraumatic.  Eyes: Conjunctivae and EOM are normal. Pupils are equal, round, and reactive to light.  Neck: Normal range of motion. Neck supple. No thyromegaly present.  Cardiovascular: Normal rate, normal heart sounds and intact distal pulses.   No murmur heard. Irregular S1/S2  Pulmonary/Chest: Effort normal and breath sounds normal. No respiratory distress.  Abdominal: Soft. Bowel sounds are normal. He exhibits no distension.  Musculoskeletal: He exhibits no edema.  Lymphadenopathy:    He has no cervical adenopathy.  Neurological: He is alert and oriented to person, place, and time. No cranial nerve deficit.  Skin: Skin is warm and dry.  Psychiatric: He has a normal mood and affect. His behavior is normal.          Assessment & Plan:

## 2013-05-13 NOTE — Assessment & Plan Note (Signed)
This is 3rd recurrence for pt.  Following w/ cards.  Now on Xarelto.  Asymptomatic.  Will follow along.

## 2013-05-13 NOTE — Patient Instructions (Signed)
Schedule your complete physical in 6 months We'll notify you of your lab results and make any changes if needed Keep up the good work! Happy Fall!!!

## 2013-05-26 ENCOUNTER — Telehealth: Payer: Self-pay | Admitting: Family Medicine

## 2013-05-26 MED ORDER — POTASSIUM CHLORIDE CRYS ER 20 MEQ PO TBCR: 20.0000 meq | EXTENDED_RELEASE_TABLET | Freq: Every day | ORAL | Status: DC

## 2013-05-26 MED ORDER — DILTIAZEM HCL ER 240 MG PO CP24: 240.0000 mg | ORAL_CAPSULE | Freq: Every day | ORAL | Status: DC

## 2013-05-26 NOTE — Telephone Encounter (Signed)
Patient is requesting a refill of diltiazem and klor con to be sent to [facility] pharmacy

## 2013-05-26 NOTE — Telephone Encounter (Signed)
Med filled and pt notified.  

## 2013-10-23 ENCOUNTER — Ambulatory Visit: Payer: Medicare Other | Admitting: Cardiology

## 2013-11-10 ENCOUNTER — Telehealth: Payer: Self-pay

## 2013-11-10 NOTE — Telephone Encounter (Signed)
Medication List and allergies:  Reviewed and updated  90 day supply/mail order: [facility] (MAIL ORDER) [place], [place] Local prescriptions:  [facility] - [place] ([place]), [place] - [address] [place]  Immunization due:  UTD  A/P: Personal, family and PSH: Reviewed and updated Flu- 05/13/13 Tdap- 11/04/05 PNA- 05/20/06 Shingles- 08/09/08 CCS- 09/09/08- moderate diverticulosis otw normal PSA- 11/11/08- 3.70   To discuss with provider: Nothing at this time.

## 2013-11-11 ENCOUNTER — Encounter: Payer: Self-pay | Admitting: Family Medicine

## 2013-11-11 ENCOUNTER — Ambulatory Visit (INDEPENDENT_AMBULATORY_CARE_PROVIDER_SITE_OTHER): Payer: Medicare Other | Admitting: Family Medicine

## 2013-11-11 ENCOUNTER — Telehealth: Payer: Self-pay | Admitting: Family Medicine

## 2013-11-11 VITALS — BP 140/88 | HR 96 | Temp 98.4°F | Resp 16 | Ht 74.5 in | Wt 207.0 lb

## 2013-11-11 DIAGNOSIS — I10 Essential (primary) hypertension: Secondary | ICD-10-CM

## 2013-11-11 DIAGNOSIS — Z0000 Encounter for general adult medical examination without abnormal findings: Secondary | ICD-10-CM

## 2013-11-11 DIAGNOSIS — Z125 Encounter for screening for malignant neoplasm of prostate: Secondary | ICD-10-CM

## 2013-11-11 LAB — BASIC METABOLIC PANEL
BUN: 24 mg/dL — ABNORMAL HIGH (ref 6–23)
CHLORIDE: 105 meq/L (ref 96–112)
CO2: 29 mEq/L (ref 19–32)
Calcium: 9.6 mg/dL (ref 8.4–10.5)
Creatinine, Ser: 1.3 mg/dL (ref 0.4–1.5)
GFR: 55.89 mL/min — AB (ref >60.00)
Glucose, Bld: 91 mg/dL (ref 70–99)
POTASSIUM: 3.9 meq/L (ref 3.5–5.1)
SODIUM: 142 meq/L (ref 135–145)

## 2013-11-11 LAB — HEPATIC FUNCTION PANEL
ALK PHOS: 78 U/L (ref 39–117)
ALT: 15 U/L (ref 0–53)
AST: 19 U/L (ref 0–37)
Albumin: 4.5 g/dL (ref 3.5–5.2)
BILIRUBIN DIRECT: 0.2 mg/dL (ref 0.0–0.3)
BILIRUBIN TOTAL: 1.2 mg/dL (ref 0.3–1.2)
Total Protein: 7.3 g/dL (ref 6.0–8.3)

## 2013-11-11 LAB — CBC WITH DIFFERENTIAL/PLATELET
BASOS ABS: 0 10*3/uL (ref 0.0–0.1)
Basophils Relative: 0.5 % (ref 0.0–3.0)
Eosinophils Absolute: 0 10*3/uL (ref 0.0–0.7)
Eosinophils Relative: 0.8 % (ref 0.0–5.0)
HCT: 42.8 % (ref 39.0–52.0)
Hemoglobin: 14.3 g/dL (ref 13.0–17.0)
Lymphocytes Relative: 20.7 % (ref 12.0–46.0)
Lymphs Abs: 1.2 10*3/uL (ref 0.7–4.0)
MCHC: 33.5 g/dL (ref 30.0–36.0)
MCV: 91.9 fl (ref 78.0–100.0)
MONO ABS: 0.4 10*3/uL (ref 0.1–1.0)
Monocytes Relative: 7.1 % (ref 3.0–12.0)
NEUTROS ABS: 4.2 10*3/uL (ref 1.4–7.7)
Neutrophils Relative %: 70.9 % (ref 43.0–77.0)
PLATELETS: 147 10*3/uL — AB (ref 150.0–400.0)
RBC: 4.65 Mil/uL (ref 4.22–5.81)
RDW: 15 % — AB (ref 11.5–14.6)
WBC: 6 10*3/uL (ref 4.5–10.5)

## 2013-11-11 LAB — LIPID PANEL
Cholesterol: 129 mg/dL (ref 0–200)
HDL: 44.8 mg/dL (ref >39.00)
LDL Cholesterol: 67 mg/dL (ref 0–99)
Total CHOL/HDL Ratio: 3
Triglycerides: 84 mg/dL (ref 0.0–149.0)
VLDL: 16.8 mg/dL (ref 0.0–40.0)

## 2013-11-11 LAB — TSH: TSH: 1.3 u[IU]/mL (ref 0.35–5.50)

## 2013-11-11 LAB — PSA: PSA: 3.27 ng/mL (ref 0.10–4.00)

## 2013-11-11 MED ORDER — POTASSIUM CHLORIDE CRYS ER 20 MEQ PO TBCR: 20.0000 meq | EXTENDED_RELEASE_TABLET | Freq: Every day | ORAL | Status: DC

## 2013-11-11 MED ORDER — DILTIAZEM HCL ER 240 MG PO CP24: 240.0000 mg | ORAL_CAPSULE | Freq: Every day | ORAL | Status: DC

## 2013-11-11 NOTE — Assessment & Plan Note (Signed)
Pt's PE WNL.  UTD on colonoscopy.  Check labs.  Anticipatory guidance provided.

## 2013-11-11 NOTE — Assessment & Plan Note (Signed)
>>  ASSESSMENT AND PLAN FOR PROSTATE CANCER SCREENING WRITTEN ON 11/11/2013 10:37 AM BY Sheliah Hatch, MD  Check PSA.  If elevated, refer back to Urology.

## 2013-11-11 NOTE — Telephone Encounter (Signed)
Relevant patient education assigned to patient using Emmi. ° °

## 2013-11-11 NOTE — Assessment & Plan Note (Signed)
Check PSA.  If elevated, refer back to Urology.

## 2013-11-11 NOTE — Assessment & Plan Note (Signed)
Chronic problem, well controlled.  Asymptomatic.  Check labs.  No anticipated med changes. °

## 2013-11-11 NOTE — Progress Notes (Signed)
   Subjective:    Patient ID: [name], male    DOB: 1940-09-16, 73 y.o.   MRN: [id number]  HPI Here today for CPE.  Risk Factors: HTN- chronic problem, on Benazepril, Dilt.  Home BP 124/76 this AM, last night 134/88.  No CP, SOB, HAs, visual changes, edema. Physical Activity: exercising regularly Fall Risk: low risk Depression: denies current sxs Hearing: decreased to conversational tones and whispered voice ADL's: independent Cognitive: normal linear thought process, memory and attention intact Home Safety: safe at home, lives w/ wife Height, Weight, BMI, Visual Acuity: see vitals, vision corrected to 20/20 w/ glasses Counseling: UTD on colonoscopy, no longer following w/ Dr Risa Grill Labs Ordered: See A&P Care Plan: See A&P    Review of Systems Patient reports no vision/hearing changes, anorexia, fever ,adenopathy, persistant/recurrent hoarseness, swallowing issues, chest pain, palpitations, edema, persistant/recurrent cough, hemoptysis, dyspnea (rest,exertional, paroxysmal nocturnal), gastrointestinal  bleeding (melena, rectal bleeding), abdominal pain, excessive heart burn, GU symptoms (dysuria, hematuria, voiding/incontinence issues) syncope, focal weakness, memory loss, numbness & tingling, skin/hair/nail changes, depression, anxiety, abnormal bruising/bleeding, musculoskeletal symptoms/signs.     Objective:   Physical Exam BP 140/88  Pulse 96  Temp(Src) 98.4 F (36.9 C) (Oral)  Resp 16  Ht 6' 2.5" (1.892 m)  Wt 207 lb (93.895 kg)  BMI 26.23 kg/m2  SpO2 98%  General Appearance:    Alert, cooperative, no distress, appears stated age  Head:    Normocephalic, without obvious abnormality, atraumatic  Eyes:    PERRL, conjunctiva/corneas clear, EOM's intact, fundi    benign, both eyes       Ears:    Normal TM's and external ear canals, both ears  Nose:   Nares normal, septum midline, mucosa normal, no drainage   or sinus tenderness  Throat:   Lips, mucosa, and tongue  normal; teeth and gums normal  Neck:   Supple, symmetrical, trachea midline, no adenopathy;       thyroid:  No enlargement/tenderness/nodules  Back:     Symmetric, no curvature, ROM normal, no CVA tenderness  Lungs:     Clear to auscultation bilaterally, respirations unlabored  Chest wall:    No tenderness or deformity  Heart:    Regular rate and rhythm, S1 and S2 normal, no murmur, rub   or gallop  Abdomen:     Soft, non-tender, bowel sounds active all four quadrants,    no masses, no organomegaly  Genitalia:    Deferred at pt's request  Rectal:    Extremities:   Extremities normal, atraumatic, no cyanosis or edema  Pulses:   2+ and symmetric all extremities  Skin:   Skin color, texture, turgor normal, no rashes or lesions  Lymph nodes:   Cervical, supraclavicular, and axillary nodes normal  Neurologic:   CNII-XII intact. Normal strength, sensation and reflexes      throughout          Assessment & Plan:

## 2013-11-11 NOTE — Progress Notes (Signed)
Pre visit review using our clinic review tool, if applicable. No additional management support is needed unless otherwise documented below in the visit note. 

## 2013-11-11 NOTE — Patient Instructions (Signed)
Follow up in 6 months to recheck BP We'll notify you of your lab results and make any changes if needed Call with any questions or concerns Keep up the good work! Happy Spring!!!

## 2013-12-04 ENCOUNTER — Ambulatory Visit: Payer: Medicare Other | Admitting: Cardiology

## 2014-01-18 ENCOUNTER — Encounter: Payer: Self-pay | Admitting: Cardiology

## 2014-01-18 ENCOUNTER — Ambulatory Visit (INDEPENDENT_AMBULATORY_CARE_PROVIDER_SITE_OTHER): Payer: Medicare Other | Admitting: Cardiology

## 2014-01-18 VITALS — BP 156/102 | HR 56 | Ht 74.5 in | Wt 209.8 lb

## 2014-01-18 DIAGNOSIS — Z7901 Long term (current) use of anticoagulants: Secondary | ICD-10-CM | POA: Insufficient documentation

## 2014-01-18 DIAGNOSIS — I10 Essential (primary) hypertension: Secondary | ICD-10-CM

## 2014-01-18 DIAGNOSIS — I4891 Unspecified atrial fibrillation: Secondary | ICD-10-CM

## 2014-01-18 MED ORDER — RIVAROXABAN 20 MG PO TABS: 20.0000 mg | ORAL_TABLET | Freq: Every day | ORAL | Status: DC

## 2014-01-18 MED ORDER — HYDRALAZINE HCL 25 MG PO TABS: 25.0000 mg | ORAL_TABLET | Freq: Three times a day (TID) | ORAL | Status: DC

## 2014-01-18 NOTE — Progress Notes (Signed)
[name] Date of Birth: May 19, 1941   History of Present Illness: [name] is seen today for followup of his atrial fibrillation. He has a history of atrial fibrillation dating back a number of years. He was placed on sotalol in 2007 which controlled his rhythm well. The dose did need to be reduced because of marked bradycardia. On his last visit he was found to be in recurrent atrial fibrillation. He was asymptomatic and a rate control strategy was taken.  On follow up today he states he is feeling well. BP readings from home range from 932-671 systolic and 24-58 diastolic. He denies any chest pain, SOB, or palpitations. No dizziness.  Current Outpatient Prescriptions on File Prior to Visit  Medication Sig Dispense Refill  . benazepril (LOTENSIN) 20 MG tablet Take 1 tablet (20 mg total) by mouth 2 (two) times daily.  180 tablet  3  . diltiazem (DILACOR XR) 240 MG 24 hr capsule Take 1 capsule (240 mg total) by mouth daily.  90 capsule  1  . Garlic 10 MG CAPS Take 3 capsules by mouth daily.      . Multiple Vitamin (MULTIVITAMIN) tablet Take 1 tablet by mouth daily.      . Omega-3 Fatty Acids (FISH OIL) 1000 MG CAPS Take by mouth daily.        . potassium chloride SA (KLOR-CON M20) 20 MEQ tablet Take 1 tablet (20 mEq total) by mouth daily.  90 tablet  1  . Saw Palmetto, Serenoa repens, (SAW PALMETTO BERRIES) 540 MG CAPS Take 1,080 mg by mouth daily.        . sildenafil (VIAGRA) 100 MG tablet Take 100 mg by mouth daily as needed.        . vitamin B-12 (CYANOCOBALAMIN) 500 MCG tablet Take 500 mcg by mouth daily.       No current facility-administered medications on file prior to visit.    Allergies  Allergen Reactions  . Cipro Iv [Ciprofloxacin]   . Hctz [Hydrochlorothiazide] Photosensitivity    Past Medical History  Diagnosis Date  . Atrial fibrillation   . Hypertension   . Erectile dysfunction   . Hypokalemia   . Acute renal insufficiency     resolved  . Meningioma    Probable 13 x 12 mm meningioma in right frontal region  . Thrombocytopenia     improved  . Chronic dermatitis     Past Surgical History  Procedure Laterality Date  . Tonsillectomy    . Eye surgery      spot removed from cornea  . Toenail excision      ingrown toenail removal  . Tee with cardioversion  12/14/2004    Successful TEE guided electrical cardioversion. -- showed mild mitral insufficiency and trace pulmonic insufficiency     History  Smoking status  . Former Smoker -- 1.00 packs/day for 10 years  . Types: Cigarettes  . Quit date: 04/25/1970  Smokeless tobacco  . Not on file    History  Alcohol Use No    Family History  Problem Relation Age of Onset  . Heart disease Mother     with pacemaker  . Diabetes Mother   . Asthma Mother   . Hypertension Father   . Cancer Father     pancreatic cancer  . Diabetes Brother   . Hypertension Sister   . Diabetes Sister     Review of Systems: As noted in history of present illness.  All other systems were reviewed  and are negative.  Physical Exam: BP 156/102  Pulse 56  Ht 6' 2.5" (1.892 m)  Wt 209 lb 12.8 oz (95.165 kg)  BMI 26.58 kg/m2  SpO2 97% The patient is alert and oriented x 3.   The skin is warm and dry.    The HEENT exam is normal. The carotids are 2+ without bruits.  There is no thyromegaly.  There is no JVD.  The lungs are clear.   The heart exam reveals an irregular rate with a normal S1 and S2.  There are no murmurs, gallops, or rubs.  The PMI is not displaced.   Abdominal exam reveals good bowel sounds. There is no hepatosplenomegaly or tenderness.  There are no masses.  Exam of the legs reveal no clubbing, cyanosis, or edema.  He does have chronic dermatitis involving his upper extremities, trunk, and legs. The distal pulses are intact.  Cranial nerves II - XII are intact.  Motor and sensory functions are intact.  The gait is normal.  LABORATORY DATA: Lab Results  Component Value Date   WBC 6.0  11/11/2013   HGB 14.3 11/11/2013   HCT 42.8 11/11/2013   PLT 147.0* 11/11/2013   GLUCOSE 91 11/11/2013   CHOL 129 11/11/2013   TRIG 84.0 11/11/2013   HDL 44.80 11/11/2013   LDLCALC 67 11/11/2013   ALT 15 11/11/2013   AST 19 11/11/2013   NA 142 11/11/2013   K 3.9 11/11/2013   CL 105 11/11/2013   CREATININE 1.3 11/11/2013   BUN 24* 11/11/2013   CO2 29 11/11/2013   TSH 1.30 11/11/2013   PSA 3.27 11/11/2013      Assessment / Plan: 1. Atrial fibrillation. Now permanent. Patient is asymptomatic. His rate is well controlled on diltiazem.Continue him on Xarelto 20 mg daily. He is instructed to avoid nonsteroidal anti-inflammatory agents.  I would recommend a long-term strategy of rate control and anticoagulation.   2. Hypertension. Blood pressure is elevated today. Continue benazepril and diltiazem. Not a candidate for beta blockers due to bradycardia in the past. Not a candidate for HCTZ due to history of severe photosensitivity reaction in the past. Will add hydralazine 25 mg tid.

## 2014-01-18 NOTE — Patient Instructions (Signed)
Continue your current therapy  I will add hydralazine 25 mg three times a day for blood pressure.  I will see you in 6 months.

## 2014-05-18 ENCOUNTER — Encounter: Payer: Self-pay | Admitting: Family Medicine

## 2014-05-18 ENCOUNTER — Ambulatory Visit (INDEPENDENT_AMBULATORY_CARE_PROVIDER_SITE_OTHER): Payer: Medicare Other | Admitting: Family Medicine

## 2014-05-18 VITALS — BP 130/82 | HR 90 | Temp 98.2°F | Resp 16 | Wt 208.2 lb

## 2014-05-18 DIAGNOSIS — I10 Essential (primary) hypertension: Secondary | ICD-10-CM

## 2014-05-18 DIAGNOSIS — Z23 Encounter for immunization: Secondary | ICD-10-CM

## 2014-05-18 LAB — BASIC METABOLIC PANEL
BUN: 22 mg/dL (ref 6–23)
CHLORIDE: 107 meq/L (ref 96–112)
CO2: 23 mEq/L (ref 19–32)
Calcium: 9.6 mg/dL (ref 8.4–10.5)
Creatinine, Ser: 1.3 mg/dL (ref 0.4–1.5)
GFR: 58.86 mL/min — ABNORMAL LOW (ref >60.00)
Glucose, Bld: 86 mg/dL (ref 70–99)
POTASSIUM: 3.6 meq/L (ref 3.5–5.1)
SODIUM: 139 meq/L (ref 135–145)

## 2014-05-18 MED ORDER — POTASSIUM CHLORIDE CRYS ER 20 MEQ PO TBCR: 20.0000 meq | EXTENDED_RELEASE_TABLET | Freq: Every day | ORAL | Status: DC

## 2014-05-18 MED ORDER — DILTIAZEM HCL ER 240 MG PO CP24: 240.0000 mg | ORAL_CAPSULE | Freq: Every day | ORAL | Status: DC

## 2014-05-18 MED ORDER — BENAZEPRIL HCL 20 MG PO TABS
20.0000 mg | ORAL_TABLET | Freq: Two times a day (BID) | ORAL | Status: DC
Start: 2014-05-18 — End: 2014-11-18

## 2014-05-18 NOTE — Assessment & Plan Note (Signed)
Chronic problem for pt.  Well controlled w/ addition of Hydralazine.  Asymptomatic.  Check BMP.  No anticipated med changes.

## 2014-05-18 NOTE — Progress Notes (Signed)
   Subjective:    Patient ID: [name], male    DOB: 1940/12/19, 73 y.o.   MRN: [id number]  HPI HTN- chronic problem, well controlled on Benazepril, dilt.  Hydralazine was added at last visit w/ Dr Martinique.  Home BPs 117-121/73-79.  No CP, SOB, HAs, visual changes, edema.    Review of Systems For ROS see HPI     Objective:   Physical Exam  Vitals reviewed. Constitutional: He is oriented to person, place, and time. He appears well-developed and well-nourished. No distress.  HENT:  Head: Normocephalic and atraumatic.  Eyes: Conjunctivae and EOM are normal. Pupils are equal, round, and reactive to light.  Neck: Normal range of motion. Neck supple. No thyromegaly present.  Cardiovascular: Normal rate and intact distal pulses.   No murmur heard. Irregular S1/S2  Pulmonary/Chest: Effort normal and breath sounds normal. No respiratory distress.  Abdominal: Soft. Bowel sounds are normal. He exhibits no distension.  Musculoskeletal: He exhibits no edema.  Lymphadenopathy:    He has no cervical adenopathy.  Neurological: He is alert and oriented to person, place, and time. No cranial nerve deficit.  Skin: Skin is warm and dry.  Psychiatric: He has a normal mood and affect. His behavior is normal.          Assessment & Plan:

## 2014-05-18 NOTE — Progress Notes (Signed)
Pre visit review using our clinic review tool, if applicable. No additional management support is needed unless otherwise documented below in the visit note. 

## 2014-05-18 NOTE — Patient Instructions (Signed)
Schedule your complete physical in 6 months We'll notify you of your lab results and make any changes if needed Keep up the good work!  You look great! Call with any questions or concerns Happy Fall!!! 

## 2014-07-20 ENCOUNTER — Encounter: Payer: Self-pay | Admitting: Cardiology

## 2014-07-20 ENCOUNTER — Ambulatory Visit (INDEPENDENT_AMBULATORY_CARE_PROVIDER_SITE_OTHER): Payer: Medicare Other | Admitting: Cardiology

## 2014-07-20 VITALS — BP 168/100 | Ht 74.0 in | Wt 212.5 lb

## 2014-07-20 DIAGNOSIS — I482 Chronic atrial fibrillation, unspecified: Secondary | ICD-10-CM

## 2014-07-20 DIAGNOSIS — Z7901 Long term (current) use of anticoagulants: Secondary | ICD-10-CM

## 2014-07-20 DIAGNOSIS — I10 Essential (primary) hypertension: Secondary | ICD-10-CM

## 2014-07-20 NOTE — Patient Instructions (Signed)
Continue your current therapy  I will see you in one year   

## 2014-07-20 NOTE — Progress Notes (Signed)
[name] Date of Birth: [birth year]   History of Present Illness: Mr. Taubman is seen today for followup of his atrial fibrillation and HTN. He has a history of atrial fibrillation dating back a number of years. He was initially placed on Sotalol but with recurrence we have been treating him with rate control only.  On his last visit his BP was high and hydralazine was added. BP readings at home have been good. 121-129/74-84. On follow up today he states he is feeling well.  He denies any chest pain, SOB, or palpitations. No dizziness.  Current Outpatient Prescriptions on File Prior to Visit  Medication Sig Dispense Refill  . Ascorbic Acid (VITAMIN C) 1000 MG tablet Take 1,000 mg by mouth daily.    . benazepril (LOTENSIN) 20 MG tablet Take 1 tablet (20 mg total) by mouth 2 (two) times daily. 180 tablet 3  . Cholecalciferol (VITAMIN D-3) 5000 UNITS TABS Take 1 tablet by mouth daily.    Marland Kitchen diltiazem (DILACOR XR) 240 MG 24 hr capsule Take 1 capsule (240 mg total) by mouth daily. 90 capsule 1  . Garlic 9381 MG CAPS Take 1 capsule by mouth daily.    . hydrALAZINE (APRESOLINE) 25 MG tablet Take 1 tablet (25 mg total) by mouth 3 (three) times daily. 270 tablet 3  . Multiple Vitamin (MULTIVITAMIN) tablet Take 1 tablet by mouth daily.    . Omega-3 Fatty Acids (FISH OIL) 1000 MG CAPS Take by mouth daily.      . potassium chloride SA (KLOR-CON M20) 20 MEQ tablet Take 1 tablet (20 mEq total) by mouth daily. 90 tablet 1  . rivaroxaban (XARELTO) 20 MG TABS tablet Take 1 tablet (20 mg total) by mouth daily with supper. 90 tablet 3  . Saw Palmetto, Serenoa repens, (SAW PALMETTO BERRIES) 540 MG CAPS Take 1,080 mg by mouth daily.      . sildenafil (VIAGRA) 100 MG tablet Take 100 mg by mouth daily as needed.      . vitamin B-12 (CYANOCOBALAMIN) 500 MCG tablet Take 500 mcg by mouth daily.     No current facility-administered medications on file prior to visit.    Allergies  Allergen Reactions  . Cipro Iv  [Ciprofloxacin]   . Hctz [Hydrochlorothiazide] Photosensitivity    Past Medical History  Diagnosis Date  . Atrial fibrillation   . Hypertension   . Erectile dysfunction   . Hypokalemia   . Acute renal insufficiency     resolved  . Meningioma     Probable 13 x 12 mm meningioma in right frontal region  . Thrombocytopenia     improved  . Chronic dermatitis     Past Surgical History  Procedure Laterality Date  . Tonsillectomy    . Eye surgery      spot removed from cornea  . Toenail excision      ingrown toenail removal  . Tee with cardioversion  12/14/2004    Successful TEE guided electrical cardioversion. -- showed mild mitral insufficiency and trace pulmonic insufficiency     History  Smoking status  . Former Smoker -- 1.00 packs/day for 10 years  . Types: Cigarettes  . Quit date: 04/25/1970  Smokeless tobacco  . Not on file    History  Alcohol Use No    Family History  Problem Relation Age of Onset  . Heart disease Mother     with pacemaker  . Diabetes Mother   . Asthma Mother   . Hypertension Father   .  Cancer Father     pancreatic cancer  . Diabetes Brother   . Hypertension Sister   . Diabetes Sister     Review of Systems: As noted in history of present illness.  All other systems were reviewed and are negative.  Physical Exam: BP 168/100 mmHg  Ht 6\' 2"  (1.88 m)  Wt 212 lb 8 oz (96.389 kg)  BMI 27.27 kg/m2 The patient is alert and oriented x 3.   The skin is warm and dry.    The HEENT exam is normal. The carotids are 2+ without bruits.  There is no thyromegaly.  There is no JVD.  The lungs are clear.   The heart exam reveals an irregular rate with a normal S1 and S2.  There are no murmurs, gallops, or rubs.  The PMI is not displaced.   Abdominal exam reveals good bowel sounds. There is no hepatosplenomegaly or tenderness.  There are no masses.  Exam of the legs reveal no clubbing, cyanosis, or edema.  He does have chronic dermatitis involving his  upper extremities, trunk, and legs. The distal pulses are intact.  Cranial nerves II - XII are intact.  Motor and sensory functions are intact.  The gait is normal.  LABORATORY DATA: Lab Results  Component Value Date   WBC 6.0 11/11/2013   HGB 14.3 11/11/2013   HCT 42.8 11/11/2013   PLT 147.0* 11/11/2013   GLUCOSE 86 05/18/2014   CHOL 129 11/11/2013   TRIG 84.0 11/11/2013   HDL 44.80 11/11/2013   LDLCALC 67 11/11/2013   ALT 15 11/11/2013   AST 19 11/11/2013   NA 139 05/18/2014   K 3.6 05/18/2014   CL 107 05/18/2014   CREATININE 1.3 05/18/2014   BUN 22 05/18/2014   CO2 23 05/18/2014   TSH 1.30 11/11/2013   PSA 3.27 11/11/2013    Ecg today shows atrial fib with rate 91 bpm, cannot rule out septal infarct, age undetermined. I have personally reviewed and interpreted this study.   Assessment / Plan: 1. Atrial fibrillation. Now permanent. Patient is asymptomatic. His rate is well controlled on diltiazem.Continue him on Xarelto 20 mg daily. He is instructed to avoid nonsteroidal anti-inflammatory agents.  I would recommend a long-term strategy of rate control and anticoagulation.   2. Hypertension. Blood pressure is improved with addition of hydralazine. Continue current therapy  I will follow up in 1 year.

## 2014-08-25 ENCOUNTER — Emergency Department (HOSPITAL_COMMUNITY)
Admission: EM | Admit: 2014-08-25 | Discharge: 2014-08-25 | Disposition: A | Discharge status: Home / Self Care | Payer: Medicare Other | Source: Home / Self Care | Attending: Emergency Medicine | Admitting: Emergency Medicine

## 2014-08-25 ENCOUNTER — Encounter (HOSPITAL_COMMUNITY): Payer: Self-pay | Admitting: Emergency Medicine

## 2014-08-25 DIAGNOSIS — R531 Weakness: Secondary | ICD-10-CM | POA: Diagnosis not present

## 2014-08-25 DIAGNOSIS — Z7901 Long term (current) use of anticoagulants: Secondary | ICD-10-CM | POA: Insufficient documentation

## 2014-08-25 DIAGNOSIS — I482 Chronic atrial fibrillation, unspecified: Secondary | ICD-10-CM

## 2014-08-25 DIAGNOSIS — N183 Chronic kidney disease, stage 3 (moderate): Secondary | ICD-10-CM

## 2014-08-25 DIAGNOSIS — Z79899 Other long term (current) drug therapy: Secondary | ICD-10-CM

## 2014-08-25 DIAGNOSIS — I4891 Unspecified atrial fibrillation: Secondary | ICD-10-CM | POA: Insufficient documentation

## 2014-08-25 DIAGNOSIS — R040 Epistaxis: Secondary | ICD-10-CM | POA: Insufficient documentation

## 2014-08-25 DIAGNOSIS — I129 Hypertensive chronic kidney disease with stage 1 through stage 4 chronic kidney disease, or unspecified chronic kidney disease: Secondary | ICD-10-CM

## 2014-08-25 DIAGNOSIS — Z872 Personal history of diseases of the skin and subcutaneous tissue: Secondary | ICD-10-CM

## 2014-08-25 DIAGNOSIS — Z87891 Personal history of nicotine dependence: Secondary | ICD-10-CM | POA: Insufficient documentation

## 2014-08-25 LAB — I-STAT CHEM 8, ED
BUN: 43 mg/dL — AB (ref 6–23)
CHLORIDE: 109 meq/L (ref 96–112)
CREATININE: 1.6 mg/dL — AB (ref 0.50–1.35)
Calcium, Ion: 1.14 mmol/L (ref 1.13–1.30)
Glucose, Bld: 133 mg/dL — ABNORMAL HIGH (ref 70–99)
HCT: 42 % (ref 39.0–52.0)
HEMOGLOBIN: 14.3 g/dL (ref 13.0–17.0)
Potassium: 4.6 mmol/L (ref 3.5–5.1)
Sodium: 144 mmol/L (ref 135–145)
TCO2: 22 mmol/L (ref 0–100)

## 2014-08-25 MED ORDER — DILTIAZEM HCL 25 MG/5ML IV SOLN
10.0000 mg | Freq: Once | INTRAVENOUS | Status: AC
  Administered 2014-08-25: 10 mg via INTRAVENOUS
  Filled 2014-08-25: qty 5

## 2014-08-25 MED ORDER — OXYMETAZOLINE HCL 0.05 % NA SOLN
1.0000 | Freq: Once | NASAL | Status: AC
  Administered 2014-08-25: 1 via NASAL
  Filled 2014-08-25: qty 15

## 2014-08-25 MED ORDER — SODIUM CHLORIDE 0.9 % IV BOLUS (SEPSIS)
1000.0000 mL | Freq: Once | INTRAVENOUS | Status: AC
  Administered 2014-08-25: 1000 mL via INTRAVENOUS

## 2014-08-25 MED ORDER — DILTIAZEM HCL 25 MG/5ML IV SOLN
15.0000 mg | Freq: Once | INTRAVENOUS | Status: AC
  Administered 2014-08-25: 15 mg via INTRAVENOUS

## 2014-08-25 MED ORDER — DILTIAZEM HCL 60 MG PO TABS
120.0000 mg | ORAL_TABLET | Freq: Once | ORAL | Status: AC
  Administered 2014-08-25: 120 mg via ORAL
  Filled 2014-08-25: qty 2

## 2014-08-25 NOTE — ED Notes (Signed)
Dr. Tawnya Crook at the bedside with ENT cart.

## 2014-08-25 NOTE — ED Notes (Signed)
ENT cart placed outside door.

## 2014-08-25 NOTE — ED Notes (Signed)
Pt. reports epistaxis at right nare onset last weekend and again this evening , denies injury , pt. is currently taking Xarelto for Atrial Fibrillation , respirations unlabored .

## 2014-08-25 NOTE — ED Notes (Signed)
MD Docherty at bedside. 

## 2014-08-25 NOTE — Discharge Instructions (Signed)

## 2014-08-25 NOTE — ED Provider Notes (Signed)
CSN: [id number]     Arrival date & time 08/25/14  0004 History  This chart was scribed for Samuel Patches, MD by Delphia Grates, ED Scribe. This patient was seen in room A05C/A05C and the patient's care was started at 1:03 AM.   Chief Complaint  Patient presents with  . Epistaxis    Patient is a 74 y.o. male presenting with nosebleeds. The history is provided by the patient. No language interpreter was used.  Epistaxis Location:  R nare Duration:  4 hours Timing:  Constant Progression:  Unchanged Chronicity:  Recurrent Context: anticoagulants   Relieved by:  Nothing Worsened by:  Nothing tried Ineffective treatments:  None tried Associated symptoms: no congestion, no cough, no dizziness, no fever and no headaches      HPI Comments: [name] is a 74 y.o. male, with history of A-fib and HTN, who presents to the Emergency Department complaining of persistent epistaxis from the right nare that began approximately 4 hours ago at 2100. Patient reports he experienced 2 prior episodes 3-4 days ago that resolved on their own. Patient currently takes Xarelto for A-fib and states he typically stays in A-fib. He is also currently on diltiazem, hydralazine, benazepril, and notes compliance with all medications. He denies any other bleeding, weakness, SOB, fevers, chills, chest pain, palpations.   Past Medical History  Diagnosis Date  . Atrial fibrillation   . Hypertension   . Erectile dysfunction   . Hypokalemia   . Acute renal insufficiency     resolved  . Meningioma     Probable 13 x 12 mm meningioma in right frontal region  . Thrombocytopenia     improved  . Chronic dermatitis    Past Surgical History  Procedure Laterality Date  . Tonsillectomy    . Eye surgery      spot removed from cornea  . Toenail excision      ingrown toenail removal  . Tee with cardioversion  12/14/2004    Successful TEE guided electrical cardioversion. -- showed mild mitral insufficiency and trace  pulmonic insufficiency    Family History  Problem Relation Age of Onset  . Heart disease Mother     with pacemaker  . Diabetes Mother   . Asthma Mother   . Hypertension Father   . Cancer Father     pancreatic cancer  . Diabetes Brother   . Hypertension Sister   . Diabetes Sister    History  Substance Use Topics  . Smoking status: Former Smoker -- 1.00 packs/day for 10 years    Types: Cigarettes    Quit date: 04/25/1970  . Smokeless tobacco: Not on file  . Alcohol Use: No    Review of Systems  Constitutional: Negative for fever, activity change, appetite change and fatigue.  HENT: Positive for nosebleeds. Negative for congestion, facial swelling, rhinorrhea and trouble swallowing.   Eyes: Negative for photophobia and pain.  Respiratory: Negative for cough, chest tightness and shortness of breath.   Cardiovascular: Negative for chest pain and leg swelling.  Gastrointestinal: Negative for nausea, vomiting, abdominal pain, diarrhea and constipation.  Endocrine: Negative for polydipsia and polyuria.  Genitourinary: Negative for dysuria, urgency, decreased urine volume and difficulty urinating.  Musculoskeletal: Negative for back pain and gait problem.  Skin: Negative for color change, rash and wound.  Allergic/Immunologic: Negative for immunocompromised state.  Neurological: Negative for dizziness, facial asymmetry, speech difficulty, weakness, numbness and headaches.  Psychiatric/Behavioral: Negative for confusion, decreased concentration and agitation.  Allergies  Cipro iv and Hctz  Home Medications   Prior to Admission medications   Medication Sig Start Date End Date Taking? Authorizing Provider  Ascorbic Acid (VITAMIN C) 1000 MG tablet Take 1,000 mg by mouth daily.   Yes Historical Provider, MD  benazepril (LOTENSIN) 20 MG tablet Take 1 tablet (20 mg total) by mouth 2 (two) times daily. 05/18/14  Yes Midge Minium, MD  Cholecalciferol (VITAMIN D-3) 5000  UNITS TABS Take 1 tablet by mouth daily.   Yes Historical Provider, MD  diltiazem (DILACOR XR) 240 MG 24 hr capsule Take 1 capsule (240 mg total) by mouth daily. 05/18/14  Yes Midge Minium, MD  Garlic 5176 MG CAPS Take 1 capsule by mouth daily.   Yes Historical Provider, MD  hydrALAZINE (APRESOLINE) 25 MG tablet Take 1 tablet (25 mg total) by mouth 3 (three) times daily. 01/18/14  Yes Peter M Martinique, MD  Multiple Vitamin (MULTIVITAMIN) tablet Take 1 tablet by mouth daily.   Yes Historical Provider, MD  Omega-3 Fatty Acids (FISH OIL) 1000 MG CAPS Take 1 capsule by mouth daily.    Yes Historical Provider, MD  potassium chloride SA (KLOR-CON M20) 20 MEQ tablet Take 1 tablet (20 mEq total) by mouth daily. 05/18/14  Yes Midge Minium, MD  rivaroxaban (XARELTO) 20 MG TABS tablet Take 1 tablet (20 mg total) by mouth daily with supper. 01/18/14  Yes Peter M Martinique, MD  Saw Palmetto, Serenoa repens, (SAW PALMETTO BERRIES) 540 MG CAPS Take 1,080 mg by mouth daily.     Yes Historical Provider, MD  sildenafil (VIAGRA) 100 MG tablet Take 100 mg by mouth daily as needed.     Yes Historical Provider, MD  vitamin B-12 (CYANOCOBALAMIN) 500 MCG tablet Take 500 mcg by mouth daily.   Yes Historical Provider, MD   Triage Vitals: BP 176/90 mmHg  Pulse 104  Temp(Src) 98 F (36.7 C) (Oral)  Resp 14  Ht 6\' 2"  (1.88 m)  Wt 205 lb (92.987 kg)  BMI 26.31 kg/m2  SpO2 97%  Physical Exam  Constitutional: He is oriented to person, place, and time. He appears well-developed and well-nourished. No distress.  HENT:  Head: Normocephalic and atraumatic.  Nose: Epistaxis is observed.  Mouth/Throat: No oropharyngeal exudate.  Bright red blood from right nare.  Eyes: Pupils are equal, round, and reactive to light.  Neck: Normal range of motion. Neck supple.  Cardiovascular: Normal heart sounds.  An irregularly irregular rhythm present. Tachycardia present.  Exam reveals no gallop and no friction rub.   No murmur  heard. Irregularly, irregular. Tachycardia.  Pulmonary/Chest: Effort normal and breath sounds normal. No respiratory distress. He has no wheezes. He has no rales.  Abdominal: Soft. Bowel sounds are normal. He exhibits no distension and no mass. There is no tenderness. There is no rebound and no guarding.  Musculoskeletal: Normal range of motion. He exhibits no edema or tenderness.  Neurological: He is alert and oriented to person, place, and time.  Skin: Skin is warm and dry.  Psychiatric: He has a normal mood and affect.  Nursing note and vitals reviewed.   ED Course  Procedures (including critical care time)  DIAGNOSTIC STUDIES: Oxygen Saturation is 97% on room air, adequate by my interpretation.    COORDINATION OF CARE: At 0108 Discussed treatment plan with patient. Patient agrees.    Labs Review Labs Reviewed  I-STAT CHEM 8, ED - Abnormal; Notable for the following:    BUN 43 (*)  Creatinine, Ser 1.60 (*)    Glucose, Bld 133 (*)    All other components within normal limits    Imaging Review No results found.   EKG Interpretation None      MDM   Final diagnoses:  Anterior epistaxis  Chronic anticoagulation  Chronic atrial fibrillation    Pt is a 74 y.o. male with Pmhx as above who presents with epistaxis since about 9pm.  Denies CP, SOB, lightheadedness, weakness, palpitations. On PE, he has BRB from anterior R nare, tachycardic, in afib, rate 110-140, stable BP. Afrin sprated and will try manual pressure.   Continues bleeding w/ pressure. Rhino rocket placed. Pt given PO and IV dilt given a fib w/ elevated rate.   10:38 AM Bleeding controlled. Pt given 2nd dose of dilt w/ improvement of rate. Hb stable. Will d/c w/ rhino rocket in place, plan to f/u with ENT as outpt for removal given anticoagulant use.    Madison Hickman Mane evaluation in the Emergency Department is complete. It has been determined that no acute conditions requiring further emergency  intervention are present at this time. The patient/guardian have been advised of the diagnosis and plan. We have discussed signs and symptoms that warrant return to the ED, such as changes or worsening in symptoms, continued bleeding, SOB, weakness, CP, syncope.     I personally performed the services described in this documentation, which was scribed in my presence. The recorded information has been reviewed and is accurate.     Samuel Patches, MD 08/27/14 1040

## 2014-08-27 ENCOUNTER — Encounter (HOSPITAL_COMMUNITY): Payer: Self-pay | Admitting: *Deleted

## 2014-08-27 ENCOUNTER — Emergency Department (HOSPITAL_COMMUNITY): Payer: Medicare Other

## 2014-08-27 ENCOUNTER — Inpatient Hospital Stay (HOSPITAL_COMMUNITY)
Admission: EM | Admit: 2014-08-27 | Discharge: 2014-09-01 | DRG: 309 | Disposition: A | Discharge status: Home / Self Care | Payer: Medicare Other | Attending: Internal Medicine | Admitting: Internal Medicine

## 2014-08-27 DIAGNOSIS — R55 Syncope and collapse: Secondary | ICD-10-CM | POA: Diagnosis present

## 2014-08-27 DIAGNOSIS — N183 Chronic kidney disease, stage 3 unspecified: Secondary | ICD-10-CM | POA: Diagnosis present

## 2014-08-27 DIAGNOSIS — I4891 Unspecified atrial fibrillation: Secondary | ICD-10-CM | POA: Diagnosis present

## 2014-08-27 DIAGNOSIS — Z87891 Personal history of nicotine dependence: Secondary | ICD-10-CM | POA: Diagnosis not present

## 2014-08-27 DIAGNOSIS — N529 Male erectile dysfunction, unspecified: Secondary | ICD-10-CM | POA: Diagnosis present

## 2014-08-27 DIAGNOSIS — Z888 Allergy status to other drugs, medicaments and biological substances status: Secondary | ICD-10-CM | POA: Diagnosis not present

## 2014-08-27 DIAGNOSIS — R6510 Systemic inflammatory response syndrome (SIRS) of non-infectious origin without acute organ dysfunction: Secondary | ICD-10-CM

## 2014-08-27 DIAGNOSIS — R531 Weakness: Secondary | ICD-10-CM

## 2014-08-27 DIAGNOSIS — I482 Chronic atrial fibrillation: Secondary | ICD-10-CM | POA: Diagnosis present

## 2014-08-27 DIAGNOSIS — R040 Epistaxis: Secondary | ICD-10-CM | POA: Diagnosis present

## 2014-08-27 DIAGNOSIS — E860 Dehydration: Secondary | ICD-10-CM | POA: Diagnosis present

## 2014-08-27 DIAGNOSIS — I129 Hypertensive chronic kidney disease with stage 1 through stage 4 chronic kidney disease, or unspecified chronic kidney disease: Secondary | ICD-10-CM | POA: Diagnosis present

## 2014-08-27 DIAGNOSIS — D62 Acute posthemorrhagic anemia: Secondary | ICD-10-CM | POA: Diagnosis present

## 2014-08-27 DIAGNOSIS — Z881 Allergy status to other antibiotic agents status: Secondary | ICD-10-CM | POA: Diagnosis not present

## 2014-08-27 DIAGNOSIS — D696 Thrombocytopenia, unspecified: Secondary | ICD-10-CM | POA: Diagnosis present

## 2014-08-27 DIAGNOSIS — E876 Hypokalemia: Secondary | ICD-10-CM | POA: Insufficient documentation

## 2014-08-27 DIAGNOSIS — D649 Anemia, unspecified: Secondary | ICD-10-CM | POA: Diagnosis present

## 2014-08-27 DIAGNOSIS — I481 Persistent atrial fibrillation: Secondary | ICD-10-CM

## 2014-08-27 DIAGNOSIS — Z7901 Long term (current) use of anticoagulants: Secondary | ICD-10-CM | POA: Diagnosis not present

## 2014-08-27 DIAGNOSIS — I951 Orthostatic hypotension: Secondary | ICD-10-CM

## 2014-08-27 DIAGNOSIS — I10 Essential (primary) hypertension: Secondary | ICD-10-CM | POA: Diagnosis present

## 2014-08-27 DIAGNOSIS — D329 Benign neoplasm of meninges, unspecified: Secondary | ICD-10-CM | POA: Diagnosis present

## 2014-08-27 HISTORY — DX: Chronic atrial fibrillation, unspecified: I48.20

## 2014-08-27 HISTORY — DX: Chronic kidney disease, stage 3 unspecified: N18.30

## 2014-08-27 HISTORY — DX: Chronic kidney disease, stage 3 (moderate): N18.3

## 2014-08-27 LAB — MRSA PCR SCREENING: MRSA BY PCR: NEGATIVE

## 2014-08-27 LAB — CBC
HCT: 35.1 % — ABNORMAL LOW (ref 39.0–52.0)
HCT: 33.2 % — ABNORMAL LOW (ref 39.0–52.0)
HEMOGLOBIN: 12 g/dL — AB (ref 13.0–17.0)
HEMOGLOBIN: 11.2 g/dL — AB (ref 13.0–17.0)
MCH: 30.8 pg (ref 26.0–34.0)
MCH: 30.4 pg (ref 26.0–34.0)
MCHC: 34.2 g/dL (ref 30.0–36.0)
MCHC: 33.7 g/dL (ref 30.0–36.0)
MCV: 90.2 fL (ref 78.0–100.0)
MCV: 90.2 fL (ref 78.0–100.0)
PLATELETS: 147 10*3/uL — AB (ref 150–400)
Platelets: 162 10*3/uL (ref 150–400)
RBC: 3.89 MIL/uL — AB (ref 4.22–5.81)
RBC: 3.68 MIL/uL — ABNORMAL LOW (ref 4.22–5.81)
RDW: 14.5 % (ref 11.5–15.5)
RDW: 14.5 % (ref 11.5–15.5)
WBC: 15.7 10*3/uL — ABNORMAL HIGH (ref 4.0–10.5)
WBC: 12.1 10*3/uL — ABNORMAL HIGH (ref 4.0–10.5)

## 2014-08-27 LAB — URINALYSIS, ROUTINE W REFLEX MICROSCOPIC
BILIRUBIN URINE: NEGATIVE
Glucose, UA: NEGATIVE mg/dL
Hgb urine dipstick: NEGATIVE
Ketones, ur: NEGATIVE mg/dL
NITRITE: NEGATIVE
PH: 5.5 (ref 5.0–8.0)
PROTEIN: NEGATIVE mg/dL
SPECIFIC GRAVITY, URINE: 1.017 (ref 1.005–1.030)
UROBILINOGEN UA: 0.2 mg/dL (ref 0.0–1.0)

## 2014-08-27 LAB — TROPONIN I
Troponin I: <0.03 ng/mL (ref <0.031)
Troponin I: <0.03 ng/mL (ref <0.031)

## 2014-08-27 LAB — URINE MICROSCOPIC-ADD ON

## 2014-08-27 LAB — BASIC METABOLIC PANEL
Anion gap: 10 (ref 5–15)
BUN: 19 mg/dL (ref 6–23)
CO2: 25 mmol/L (ref 19–32)
Calcium: 9.3 mg/dL (ref 8.4–10.5)
Chloride: 102 mmol/L (ref 96–112)
Creatinine, Ser: 1.61 mg/dL — ABNORMAL HIGH (ref 0.50–1.35)
GFR calc Af Amer: 47 mL/min — ABNORMAL LOW (ref >90)
GFR calc non Af Amer: 40 mL/min — ABNORMAL LOW (ref >90)
Glucose, Bld: 160 mg/dL — ABNORMAL HIGH (ref 70–99)
Potassium: 3.4 mmol/L — ABNORMAL LOW (ref 3.5–5.1)
SODIUM: 137 mmol/L (ref 135–145)

## 2014-08-27 LAB — PROTIME-INR
INR: 1.74 — AB (ref 0.00–1.49)
PROTHROMBIN TIME: 20.5 s — AB (ref 11.6–15.2)

## 2014-08-27 LAB — I-STAT TROPONIN, ED: Troponin i, poc: 0 ng/mL (ref 0.00–0.08)

## 2014-08-27 MED ORDER — DILTIAZEM LOAD VIA INFUSION
10.0000 mg | Freq: Once | INTRAVENOUS | Status: AC
  Administered 2014-08-27: 10 mg via INTRAVENOUS
  Filled 2014-08-27: qty 10

## 2014-08-27 MED ORDER — SODIUM CHLORIDE 0.9 % IV SOLN
INTRAVENOUS | Status: DC
  Administered 2014-08-27 – 2014-08-29 (×2): via INTRAVENOUS

## 2014-08-27 MED ORDER — ADULT MULTIVITAMIN W/MINERALS CH
1.0000 | ORAL_TABLET | Freq: Every day | ORAL | Status: DC
  Administered 2014-08-27 – 2014-09-01 (×6): 1 via ORAL
  Filled 2014-08-27 (×6): qty 1

## 2014-08-27 MED ORDER — VITAMIN C 500 MG PO TABS
1000.0000 mg | ORAL_TABLET | Freq: Every day | ORAL | Status: DC
  Administered 2014-08-27 – 2014-09-01 (×6): 1000 mg via ORAL
  Filled 2014-08-27 (×6): qty 2

## 2014-08-27 MED ORDER — ACETAMINOPHEN 325 MG PO TABS
650.0000 mg | ORAL_TABLET | Freq: Four times a day (QID) | ORAL | Status: DC | PRN
Start: 2014-08-27 — End: 2014-09-01

## 2014-08-27 MED ORDER — ACETAMINOPHEN 650 MG RE SUPP: 650.0000 mg | Freq: Four times a day (QID) | RECTAL | Status: DC | PRN

## 2014-08-27 MED ORDER — PROPYLENE GLYCOL 0.6 % OP SOLN: 2.0000 [drp] | Freq: Every day | OPHTHALMIC | Status: DC | PRN

## 2014-08-27 MED ORDER — DILTIAZEM HCL 100 MG IV SOLR
5.0000 mg/h | INTRAVENOUS | Status: DC
  Administered 2014-08-27 – 2014-08-28 (×3): 5 mg/h via INTRAVENOUS

## 2014-08-27 MED ORDER — POLYETHYLENE GLYCOL 3350 17 G PO PACK
17.0000 g | PACK | Freq: Every day | ORAL | Status: DC | PRN
  Filled 2014-08-27: qty 1

## 2014-08-27 MED ORDER — SODIUM CHLORIDE 0.9 % IJ SOLN
3.0000 mL | Freq: Two times a day (BID) | INTRAMUSCULAR | Status: DC
  Administered 2014-08-28 – 2014-09-01 (×7): 3 mL via INTRAVENOUS

## 2014-08-27 MED ORDER — RIVAROXABAN 20 MG PO TABS
20.0000 mg | ORAL_TABLET | Freq: Every day | ORAL | Status: DC
  Administered 2014-08-27: 20 mg via ORAL
  Filled 2014-08-27 (×2): qty 1

## 2014-08-27 MED ORDER — MORPHINE SULFATE 2 MG/ML IJ SOLN: 1.0000 mg | INTRAMUSCULAR | Status: DC | PRN

## 2014-08-27 MED ORDER — CYANOCOBALAMIN 500 MCG PO TABS
500.0000 ug | ORAL_TABLET | Freq: Every day | ORAL | Status: DC
  Administered 2014-08-27 – 2014-09-01 (×6): 500 ug via ORAL
  Filled 2014-08-27 (×7): qty 1

## 2014-08-27 MED ORDER — OMEGA-3-ACID ETHYL ESTERS 1 G PO CAPS
1.0000 g | ORAL_CAPSULE | Freq: Every day | ORAL | Status: DC
  Administered 2014-08-27 – 2014-09-01 (×6): 1 g via ORAL
  Filled 2014-08-27 (×6): qty 1

## 2014-08-27 MED ORDER — OXYCODONE HCL 5 MG PO TABS: 5.0000 mg | ORAL_TABLET | ORAL | Status: DC | PRN

## 2014-08-27 MED ORDER — SODIUM CHLORIDE 0.9 % IV SOLN
Freq: Once | INTRAVENOUS | Status: AC
  Administered 2014-08-27: 12:00:00 via INTRAVENOUS

## 2014-08-27 MED ORDER — POTASSIUM CHLORIDE CRYS ER 20 MEQ PO TBCR
40.0000 meq | EXTENDED_RELEASE_TABLET | Freq: Once | ORAL | Status: AC
  Administered 2014-08-27: 40 meq via ORAL
  Filled 2014-08-27: qty 2

## 2014-08-27 MED ORDER — POLYVINYL ALCOHOL 1.4 % OP SOLN
2.0000 [drp] | OPHTHALMIC | Status: DC | PRN
  Filled 2014-08-27: qty 15

## 2014-08-27 NOTE — ED Notes (Signed)
Cardiology at bedside.

## 2014-08-27 NOTE — ED Notes (Signed)
Pt in via EMS, states that he was seen on 1/20 for a nose bleed and since that time he has been feeling fatigued since that time, states he woke up the next day and just didn't feel right, has spent most of the last few days in bed- pt has a history of afib, noted to be in afib on the monitor, HR between 130-150- pt denies any new bleeding from his nose, still has rhino rocket in place

## 2014-08-27 NOTE — ED Provider Notes (Signed)
CSN: [id number]     Arrival date & time 08/27/14  1140 History   First MD Initiated Contact with Patient 08/27/14 1142     Chief Complaint  Patient presents with  . Fatigue     (Consider location/radiation/quality/duration/timing/severity/associated sxs/prior Treatment) Patient is a 74 y.o. male presenting with weakness. The history is provided by the patient. No language interpreter was used.  Weakness This is a new problem. The current episode started today. The problem occurs constantly. The problem has been unchanged. Associated symptoms include weakness. Nothing aggravates the symptoms.  Pt complains of feeling weak today. Pt reports he almost blacked out.  Pt reports he felt himself falling and was able to hold onto the since to lower himself to the floor.  No impact.   He is on Xarelto. Pt has a history of atrial fib.  He sees Dr. Martinique.   Pt was here 2 days ago for a nose bleed.  Pt did not feel well yesterday but increased weakness and near fall today.  No further bleeding  Past Medical History  Diagnosis Date  . Atrial fibrillation   . Hypertension   . Erectile dysfunction   . Hypokalemia   . Acute renal insufficiency     resolved  . Meningioma     Probable 13 x 12 mm meningioma in right frontal region  . Thrombocytopenia     improved  . Chronic dermatitis    Past Surgical History  Procedure Laterality Date  . Tonsillectomy    . Eye surgery      spot removed from cornea  . Toenail excision      ingrown toenail removal  . Tee with cardioversion  12/14/2004    Successful TEE guided electrical cardioversion. -- showed mild mitral insufficiency and trace pulmonic insufficiency    Family History  Problem Relation Age of Onset  . Heart disease Mother     with pacemaker  . Diabetes Mother   . Asthma Mother   . Hypertension Father   . Cancer Father     pancreatic cancer  . Diabetes Brother   . Hypertension Sister   . Diabetes Sister    History  Substance Use  Topics  . Smoking status: Former Smoker -- 1.00 packs/day for 10 years    Types: Cigarettes    Quit date: 04/25/1970  . Smokeless tobacco: Not on file  . Alcohol Use: No    Review of Systems  Neurological: Positive for weakness.  All other systems reviewed and are negative.     Allergies  Cipro iv and Hctz  Home Medications   Prior to Admission medications   Medication Sig Start Date End Date Taking? Authorizing Provider  Ascorbic Acid (VITAMIN C) 1000 MG tablet Take 1,000 mg by mouth daily.    Historical Provider, MD  benazepril (LOTENSIN) 20 MG tablet Take 1 tablet (20 mg total) by mouth 2 (two) times daily. 05/18/14   Midge Minium, MD  Cholecalciferol (VITAMIN D-3) 5000 UNITS TABS Take 1 tablet by mouth daily.    Historical Provider, MD  diltiazem (DILACOR XR) 240 MG 24 hr capsule Take 1 capsule (240 mg total) by mouth daily. 05/18/14   Midge Minium, MD  Garlic 3016 MG CAPS Take 1 capsule by mouth daily.    Historical Provider, MD  hydrALAZINE (APRESOLINE) 25 MG tablet Take 1 tablet (25 mg total) by mouth 3 (three) times daily. 01/18/14   Peter M Martinique, MD  Multiple Vitamin (MULTIVITAMIN) tablet Take 1  tablet by mouth daily.    Historical Provider, MD  Omega-3 Fatty Acids (FISH OIL) 1000 MG CAPS Take 1 capsule by mouth daily.     Historical Provider, MD  potassium chloride SA (KLOR-CON M20) 20 MEQ tablet Take 1 tablet (20 mEq total) by mouth daily. 05/18/14   Midge Minium, MD  rivaroxaban (XARELTO) 20 MG TABS tablet Take 1 tablet (20 mg total) by mouth daily with supper. 01/18/14   Peter M Martinique, MD  Saw Palmetto, Serenoa repens, (SAW PALMETTO BERRIES) 540 MG CAPS Take 1,080 mg by mouth daily.      Historical Provider, MD  sildenafil (VIAGRA) 100 MG tablet Take 100 mg by mouth daily as needed.      Historical Provider, MD  vitamin B-12 (CYANOCOBALAMIN) 500 MCG tablet Take 500 mcg by mouth daily.    Historical Provider, MD   BP 110/66 mmHg  Pulse 99   Temp(Src) 98.6 F (37 C) (Oral)  Resp 9  Ht 6\' 2"  (1.88 m)  Wt 200 lb (90.719 kg)  BMI 25.67 kg/m2  SpO2 98% Physical Exam  Constitutional: He is oriented to person, place, and time. He appears well-developed and well-nourished.  HENT:  Head: Normocephalic and atraumatic.  Right Ear: External ear normal.  Left Ear: External ear normal.  Mouth/Throat: Oropharynx is clear and moist.  Nasal packing in place  Eyes: EOM are normal. Pupils are equal, round, and reactive to light.  Neck: Normal range of motion.  Cardiovascular: Normal heart sounds.   atrail fib 130"s  Pulmonary/Chest: Effort normal.  Abdominal: Soft. He exhibits no distension.  Musculoskeletal: Normal range of motion.  Neurological: He is alert and oriented to person, place, and time.  Skin: Skin is warm.  Psychiatric: He has a normal mood and affect.  Nursing note and vitals reviewed.   ED Course  Procedures (including critical care time) Labs Review Labs Reviewed  CBC - Abnormal; Notable for the following:    WBC 15.7 (*)    RBC 3.89 (*)    Hemoglobin 12.0 (*)    HCT 35.1 (*)    All other components within normal limits  PROTIME-INR - Abnormal; Notable for the following:    Prothrombin Time 20.5 (*)    INR 1.74 (*)    All other components within normal limits  BASIC METABOLIC PANEL  I-STAT TROPOININ, ED    Imaging Review No results found.   EKG Interpretation   Date/Time:  Friday August 27 2014 11:46:20 EST Ventricular Rate:  143 PR Interval:    QRS Duration: 90 QT Interval:  322 QTC Calculation: 497 R Axis:   106 Text Interpretation:  Atrial fibrillation Right axis deviation Borderline  repolarization abnormality Borderline prolonged QT interval Baseline  wander in lead(s) III Confirmed by ZAVITZ  MD, JOSHUA ([id number]) on 08/27/2014  11:54:31 AM      MDM  Pt given Cardizem, improved rate 90's  Pt feels better.   I spoke with cardiology who will see pt here for evaluation.   Final diagnoses:   Weakness  Atrial fibrillation with RVR  Chronic anticoagulation  SIRS (systemic inflammatory response syndrome)        Fransico Meadow, PA-C 08/27/14 1429  Mariea Clonts, MD 08/29/14 [phone]

## 2014-08-27 NOTE — Progress Notes (Signed)
ANTICOAGULATION CONSULT NOTE - Initial Consult  Pharmacy Consult for Rivaroxaban Indication: atrial fibrillation  Allergies  Allergen Reactions  . Cipro Iv [Ciprofloxacin]   . Hctz [Hydrochlorothiazide] Photosensitivity    Patient Measurements: Height: 6\' 2"  (188 cm) Weight: 200 lb (90.719 kg) IBW/kg (Calculated) : 82.2  Vital Signs: Temp: 98.6 F (37 C) (01/22 1144) Temp Source: Oral (01/22 1144) BP: 121/75 mmHg (01/22 1600) Pulse Rate: 93 (01/22 1600)  Labs:  Recent Labs  08/25/14 0056 08/27/14 1146  HGB 14.3 12.0*  HCT 42.0 35.1*  PLT  --  162  LABPROT  --  20.5*  INR  --  1.74*  CREATININE 1.60* 1.61*    Estimated Creatinine Clearance: 46.8 mL/min (by C-G formula based on Cr of 1.61).   Medical History: Past Medical History  Diagnosis Date  . Chronic atrial fibrillation   . Hypertension   . Erectile dysfunction   . Hypokalemia   . Meningioma     Probable 13 x 12 mm meningioma in right frontal region  . Thrombocytopenia     improved  . Chronic dermatitis   . CKD (chronic kidney disease), stage III     a. Probable based on historical Cr. H/o ARF. Previously saw Dr. Florene Glen.    Medications:   (Not in a hospital admission) Scheduled:  . multivitamin  1 tablet Oral Daily  . omega-3 acid ethyl esters  1 g Oral Daily  . sodium chloride  3 mL Intravenous Q12H  . vitamin B-12  500 mcg Oral Daily  . vitamin C  1,000 mg Oral Daily   Infusions:  . sodium chloride    . diltiazem (CARDIZEM) infusion 5 mg/hr (08/27/14 1203)    Assessment:  64 YOM w/ chronic AF (failed sotalol, on Xarelto), HTN,  CKD stage II-III, recent ED visit for epistaxis (treated with afran and rhino rocket) presenting [facility] on 08/27[facility]14 c/o dizziness.  This AM patient felt pre-syncopal and needed to sit on the floor (no fall or LOC).    Scr 1.61, CrCl 46.8 mL/min.  Hgb 12 low, Hct 35.1 low, plt wnl.  (potentially from bleed?).  LFT's back in April were wnl. Recheck?  Of note,  patient was treated with oral and IV diltiazem.  Diltiazem is a moderate CYP 3A4 inhibitor, increasing the serum concentration of xarelto.  Often, this interaction is not of significant as medications with CYP3A4 inhibition and P-glycoprotein properties are usually more concerning.  However, this interaction and poor renal function may contribute to risk of bleeding.    I agree with Dr. Harrington Challenger to continue his current dose of xarelto 20 mg daily with supper with close monitoring or renal function.  Should his renal function continue to worsen and he experiences another bleeding episode, I would decrease his dose to 15 mg PO daily with supper.   Plan:  - Continue rivaroxaban 20 mg PO daily with supper - Monitor renal function, CBC - Monitor for signs and symptoms of bleeding  Hassie Bruce, Pharm. D. Clinical Pharmacy Resident Pager: [phone] Ph: [phone] 08/27/2014 5:03 PM

## 2014-08-27 NOTE — ED Notes (Signed)
Cardiology consult done.

## 2014-08-27 NOTE — Consult Note (Signed)
Cardiology Consultation Note  Patient ID: [name], MRN: [id number], DOB/AGE: 1941-04-09 74 y.o. Admit date: 08/27/2014   Date of Consult: 08/27/2014 Primary Physician: Annye Asa, MD Primary Cardiologist: Dr. Martinique  Chief Complaint: weakness, dizzy Reason for Consult: chronic AF with elevated rates  HPI: [name] is a 74 y/o M with history of chronic AF (failed sotalol, managed with rate control, on Xarelto), HTN, probable CKD stage II-III (previously saw Dr. Florene Glen), recent ED visit for epistaxis who presented to [facility] today with dizziness and not feeling well. Last echo 03/2013: mild focal basal hypertrophy of septum, EF 55-60%, no RWMA, mild MR, mod-severely dilated LA, mildly dilated RA. No prior history of CAD or ischemic workup.  He was seen in the ED 2 days ago with difficult-to-control epistaxis and was treated with Afrin and rhino rocket. Per notes, he had elevated HR at that time in the 110-140 range. He was given oral and IV diltiazem with rate improvement thus was discharged home with outpatient ENT f/u on Monday 08/30/14. Orthostatic VS from that visit showed SBP 170/94->144/96 with standing. BUN/Cr were 43/1.6, up from baseline of 1.3. He says he went home and caught up on sleep throughout the following afternoon. Later that evening he was feeling better. He says he was warned that due to the amount of blood he had swallowed that his stool might be black - he had one melanotic BM and subsequent stools were normal. However, yesterday and today he noticed weakness along with intermittent dizziness particularly upon standing. He says he just hasn't felt right. This morning he was standing in the bathroom and felt pre-syncopal so he sat down on the floor. No fall or LOC. His wife helped him up to the bed and they came to the ER. He was found to be in elevated rates of his chronic AF (130s-140s) with initial BP 124/109. He was treated with IV diltiazem (10mg  bolus then gtt) with  improvement in HR to the 80s-90s. He also has been treated with IV fluids with improvement in his postural dizziness. He says he otherwise has done well from a cardiac standpoint - lives in a 2 story house and goes up and down stairs frequently without any recent CP, SOB, or awareness of his AF. No LEE, orthopnea, PND, fever, chills, cough or dysuria.   In the ED, w/u notable for WBC 15k, Hgb 12.0 (14 range previously), Cr 1.61, cloudy UA with small leuks. CXR: no acute disease.  Past Medical History  Diagnosis Date  . Chronic atrial fibrillation   . Hypertension   . Erectile dysfunction   . Hypokalemia   . Meningioma     Probable 13 x 12 mm meningioma in right frontal region  . Thrombocytopenia     improved  . Chronic dermatitis   . CKD (chronic kidney disease), stage III     a. Probable based on historical Cr. H/o ARF. Previously saw Dr. Florene Glen.      Most Recent Cardiac Studies: 2D echo 03/2013 - Left ventricle: The cavity size was normal. There was mild focal basal hypertrophy of the septum. Systolic function was normal. The estimated ejection fraction was in the range of 55% to 60%. Wall motion was normal; there were no regional wall motion abnormalities. - Aortic valve: Trivial regurgitation. - Mitral valve: Mild regurgitation. - Left atrium: The atrium was moderately to severely dilated. - Right atrium: The atrium was mildly dilated.    Surgical History:  Past Surgical History  Procedure  Laterality Date  . Tonsillectomy    . Eye surgery      spot removed from cornea  . Toenail excision      ingrown toenail removal  . Tee with cardioversion  12/14/2004    Successful TEE guided electrical cardioversion. -- showed mild mitral insufficiency and trace pulmonic insufficiency      Home Meds: Prior to Admission medications   Medication Sig Start Date End Date Taking? Authorizing Provider  Ascorbic Acid (VITAMIN C) 1000 MG tablet Take 1,000 mg by mouth daily.   Yes  Historical Provider, MD  benazepril (LOTENSIN) 20 MG tablet Take 1 tablet (20 mg total) by mouth 2 (two) times daily. 05/18/14  Yes Midge Minium, MD  Cholecalciferol (VITAMIN D-3) 5000 UNITS TABS Take 1 tablet by mouth daily.   Yes Historical Provider, MD  diltiazem (DILACOR XR) 240 MG 24 hr capsule Take 1 capsule (240 mg total) by mouth daily. 05/18/14  Yes Midge Minium, MD  Garlic 3536 MG CAPS Take 1 capsule by mouth daily.   Yes Historical Provider, MD  hydrALAZINE (APRESOLINE) 25 MG tablet Take 1 tablet (25 mg total) by mouth 3 (three) times daily. 01/18/14  Yes Peter M Martinique, MD  Multiple Vitamin (MULTIVITAMIN) tablet Take 1 tablet by mouth daily.   Yes Historical Provider, MD  Omega-3 Fatty Acids (FISH OIL) 1000 MG CAPS Take 1 capsule by mouth daily.    Yes Historical Provider, MD  potassium chloride SA (KLOR-CON M20) 20 MEQ tablet Take 1 tablet (20 mEq total) by mouth daily. 05/18/14  Yes Midge Minium, MD  Propylene Glycol 0.6 % SOLN Apply 2-3 drops to eye daily as needed (for eyes).   Yes Historical Provider, MD  rivaroxaban (XARELTO) 20 MG TABS tablet Take 1 tablet (20 mg total) by mouth daily with supper. 01/18/14  Yes Peter M Martinique, MD  Saw Palmetto, Serenoa repens, (SAW PALMETTO BERRIES) 540 MG CAPS Take 1,080 mg by mouth daily.     Yes Historical Provider, MD  sildenafil (VIAGRA) 100 MG tablet Take 100 mg by mouth daily as needed for erectile dysfunction.    Yes Historical Provider, MD  vitamin B-12 (CYANOCOBALAMIN) 500 MCG tablet Take 500 mcg by mouth daily.   Yes Historical Provider, MD    Inpatient Medications:    . diltiazem (CARDIZEM) infusion 5 mg/hr (08/27/14 1203)    Allergies:  Allergies  Allergen Reactions  . Cipro Iv [Ciprofloxacin]   . Hctz [Hydrochlorothiazide] Photosensitivity    History   Social History  . Marital Status: Married    Spouse Name: N/A    Number of Children: 2  . Years of Education: N/A   Occupational History  . managed  salvage yard    Social History Main Topics  . Smoking status: Former Smoker -- 1.00 packs/day for 10 years    Types: Cigarettes    Quit date: 04/25/1970  . Smokeless tobacco: Not on file  . Alcohol Use: No  . Drug Use: No  . Sexual Activity: Not on file   Other Topics Concern  . Not on file   Social History Narrative   Keeps sister who is mentally retarded.     Family History  Problem Relation Age of Onset  . Heart disease Mother     with pacemaker  . Diabetes Mother   . Asthma Mother   . Hypertension Father   . Cancer Father     pancreatic cancer  . Diabetes Brother   . Hypertension  Sister   . Diabetes Sister   . Heart failure Mother      Review of Systems: General: negative for chills, fever, night sweats or weight changes.  Cardiovascular: see above Dermatological: negative for rash Respiratory: negative for cough or wheezing Urologic: negative for hematuria Abdominal: negative for nausea, vomiting, diarrhea, bright red blood per rectum, or hematemesis Neurologic: negative for visual changes, syncope, or dizziness All other systems reviewed and are otherwise negative except as noted above.  Labs: POC troponin negative x 1  Lab Results  Component Value Date   WBC 15.7* 08/27/2014   HGB 12.0* 08/27/2014   HCT 35.1* 08/27/2014   MCV 90.2 08/27/2014   PLT 162 08/27/2014     Recent Labs Lab 08/27/14 1146  NA 137  K 3.4*  CL 102  CO2 25  BUN 19  CREATININE 1.61*  CALCIUM 9.3  GLUCOSE 160*   Lab Results  Component Value Date   CHOL 129 11/11/2013   HDL 44.80 11/11/2013   LDLCALC 67 11/11/2013   TRIG 84.0 11/11/2013     Radiology/Studies:  Dg Chest 2 View  08/27/2014   CLINICAL DATA:  Weakness.  No chest complaints.  EXAM: CHEST  2 VIEW  COMPARISON:  05/19/2006  FINDINGS: Stable cardiac and mediastinal contours, upper limits of normal to mildly enlarged. No large consolidative pulmonary opacities. No pleural effusion or pneumothorax. Regional  skeleton is unremarkable.  IMPRESSION: No acute cardiopulmonary process.   Electronically Signed   By: Lovey Newcomer M.D.   On: 08/27/2014 14:04   EKG: Atrial fibrillation 143bpm with baseline wander present, possible mild ST depression in lead II/III, QTc 481ms  Physical Exam: Blood pressure 128/76, pulse 89, temperature 98.6 F (37 C), temperature source Oral, resp. rate 9, height 6\' 2"  (1.88 m), weight 200 lb (90.719 kg), SpO2 99 %. General: Well developed, well nourished WM, in no acute distress. Head: Normocephalic, atraumatic, sclera non-icteric, no xanthomas. Inflated rhino rocket in place in right nostril. Neck: Negative for carotid bruits. JVD not elevated. Lungs: Clear bilaterally to auscultation without wheezes, rales, or rhonchi. Breathing is unlabored. Heart: Irregular, controlled rate with S1 S2. No murmurs, rubs, or gallops appreciated. Abdomen: Soft, non-tender, non-distended with normoactive bowel sounds. No hepatomegaly. No rebound/guarding. No obvious abdominal masses. Msk:  Strength and tone appear normal for age. Extremities: No clubbing or cyanosis. No edema.  Distal pedal pulses are 2+ and equal bilaterally. Neuro: Alert and oriented X 3. No facial asymmetry. No focal deficit. Moves all extremities spontaneously. Psych:  Responds to questions appropriately with a normal affect.   Assessment and Plan:   1. Weakness/orthostasis with findings of anemia, AKI, abnormal UA, and leukocytosis  - + orthostatics on 1/19 with SBP 170->140 - no history of CHF so continued IVF would be helpful  Strict I/O - some portion of this may be volume depletion, but recommend internal medicine evaluation for contribution of the above abnormal findings on workup so far  2. Chronic atrial fibrillation with elevated rates - Elevated rate is prob due to other stressors / clinical issues (i.e. the physiologic equivalent of sinus tach) - continue IV diltiazem for now since he took his long-acting  oral diliazem this AM, but would consider transition back to home dose tomorrow AM (while holding ACEI to allow for this adjustment) - if OK with IM would continue Xarelto at present dose, but need to follow renal function closely as he is right on the cusp of CrCl 50 which would command a  dose of 15mg  daily instead (his is 49ml/min)  3. AKI on likely CKD stage II-III - recommend to hold ACEI, give IVF  Check BMET in AM    4. Recent epistaxis with Rhino Rocket in place - anemia may be due to this - per IM Would prob d/c garlic with Xarelto use    5. Hyperglycemia without prior h/o DM  6. Hypokalemia - will give 35meq KCl in the ED  Signed, Dayna Dunn PA-C 08/27/2014, 3:24 PM  Pateint seen and examined  I have amended note to reflect my findings.  Patient with orthostasis earlier and rapid afib  On exam, volume status looks good.  I would still recomm some IV fluids  Repeat vitals later today. Give IV dilt for now.  May do better on a little higher dose (300 qd)  If rates better would cut lisinopril dose in half tomorrow.  With nose bleed and potential interaction wth xarelto and garlic would d/c garlic.  Dorris Carnes

## 2014-08-27 NOTE — H&P (Signed)
[facility] History and Physical                                                                                    Patient Demographics  [name], is a 74 y.o. male  MRN: [id number]   DOB - [birth year]  Admit Date - 08/27/2014  Outpatient Primary MD for the patient is Annye Asa, MD   With History of -  Past Medical History  Diagnosis Date  . Chronic atrial fibrillation   . Hypertension   . Erectile dysfunction   . Hypokalemia   . Meningioma     Probable 13 x 12 mm meningioma in right frontal region  . Thrombocytopenia     improved  . Chronic dermatitis   . CKD (chronic kidney disease), stage III     a. Probable based on historical Cr. H/o ARF. Previously saw Dr. Florene Glen.      Past Surgical History  Procedure Laterality Date  . Tonsillectomy    . Eye surgery      spot removed from cornea  . Toenail excision      ingrown toenail removal  . Tee with cardioversion  12/14/2004    Successful TEE guided electrical cardioversion. -- showed mild mitral insufficiency and trace pulmonic insufficiency     in for   Chief Complaint  Patient presents with  . Fatigue     HPI  [name]  is a 74 y.o. male, who has a past medical history of chronic atrial fibrillation on Xarelto, chronic kidney disease stage II-III, and hypertension. He was seen recently in the ER on 1/20 for difficult to control epistaxis. Was treated with Afrin and Rhino Rocket and according to the patient has an outpatient ENT appointment scheduled for Monday 1/25. The patient apparently had extensive epistaxis but hemoglobin was 14.3. He returns to the ER today with complaints of weakness and a sensation of near syncope. States he was not aware of any tachypalpitations. Denied having chest pain or shortness of breath prior to onset of these symptoms. Upon arrival to the ER patient was found to be in atrial fibrillation with ventricular response in the 130s. He had white count of 15,700 and  his hemoglobin had dropped to 12.0. He was also somewhat orthostatic with blood pressures decreasing from 170/94 to 144/96. His BUN on 1/20 was 43 but had decreased to 19 on today's visit. He was started on IV Cardizem drip with subsequent conversion to sinus rhythm but still had variable ventricular response is up to 1:15 on 5 mg per hour. He told me that he continue to take his usual Xarelto since the recent ER visit and has had no further nasal bleeding. He was told to expect either melena or dark stools which he has had since the nosebleed.  Review of Systems    In addition to the HPI above: No Fever-chills, No Headache, No changes with Vision or hearing, No problems swallowing food or Liquids, No Chest pain, Cough or Shortness of Breath, No Abdominal pain, No Nausea or Vomiting, Bowel movements are regular, No Blood in stool or Urine, No dysuria, No new skin rashes or bruises,  No new joints pains-aches,  No new weakness, tingling, numbness in any extremity, No recent weight gain or loss, No polyuria, polydypsia or polyphagia, No significant Mental Stressors.  A full 10 point Review of Systems was done, except as stated above, all other Review of Systems were negative.   Social History History  Substance Use Topics  . Smoking status: Former Smoker -- 1.00 packs/day for 10 years    Types: Cigarettes    Quit date: 04/25/1970  . Smokeless tobacco: None  . Alcohol Use: None    Retired; developmentally disabled sister lives with patient  Family History Family History  Problem Relation Age of Onset  . Heart disease Mother     with pacemaker  . Diabetes Mother   . Asthma Mother   . Hypertension Father   . Cancer Father     pancreatic cancer  . Diabetes Brother   . Hypertension Sister   . Diabetes Sister   . Heart failure Mother      Prior to Admission medications   Medication Sig Start Date End Date Taking? Authorizing Provider  Ascorbic Acid (VITAMIN C) 1000 MG  tablet Take 1,000 mg by mouth daily.   Yes Historical Provider, MD  benazepril (LOTENSIN) 20 MG tablet Take 1 tablet (20 mg total) by mouth 2 (two) times daily. 05/18/14  Yes Midge Minium, MD  Cholecalciferol (VITAMIN D-3) 5000 UNITS TABS Take 1 tablet by mouth daily.   Yes Historical Provider, MD  diltiazem (DILACOR XR) 240 MG 24 hr capsule Take 1 capsule (240 mg total) by mouth daily. 05/18/14  Yes Midge Minium, MD  Garlic 8099 MG CAPS Take 1 capsule by mouth daily.   Yes Historical Provider, MD  hydrALAZINE (APRESOLINE) 25 MG tablet Take 1 tablet (25 mg total) by mouth 3 (three) times daily. 01/18/14  Yes Peter M Martinique, MD  Multiple Vitamin (MULTIVITAMIN) tablet Take 1 tablet by mouth daily.   Yes Historical Provider, MD  Omega-3 Fatty Acids (FISH OIL) 1000 MG CAPS Take 1 capsule by mouth daily.    Yes Historical Provider, MD  potassium chloride SA (KLOR-CON M20) 20 MEQ tablet Take 1 tablet (20 mEq total) by mouth daily. 05/18/14  Yes Midge Minium, MD  Propylene Glycol 0.6 % SOLN Apply 2-3 drops to eye daily as needed (for eyes).   Yes Historical Provider, MD  rivaroxaban (XARELTO) 20 MG TABS tablet Take 1 tablet (20 mg total) by mouth daily with supper. 01/18/14  Yes Peter M Martinique, MD  Saw Palmetto, Serenoa repens, (SAW PALMETTO BERRIES) 540 MG CAPS Take 1,080 mg by mouth daily.     Yes Historical Provider, MD  sildenafil (VIAGRA) 100 MG tablet Take 100 mg by mouth daily as needed for erectile dysfunction.    Yes Historical Provider, MD  vitamin B-12 (CYANOCOBALAMIN) 500 MCG tablet Take 500 mcg by mouth daily.   Yes Historical Provider, MD    Allergies  Allergen Reactions  . Cipro Iv [Ciprofloxacin]   . Hctz [Hydrochlorothiazide] Photosensitivity    Physical Exam  Vitals  Blood pressure 121/75, pulse 93, temperature 98.6 F (37 C), temperature source Oral, resp. rate 12, height 6\' 2"  (1.88 m), weight 200 lb (90.719 kg), SpO2 99 %.   General:  lying in bed in NAD,     Psych:  Normal affect and insight, Not Suicidal or Homicidal, Awake Alert, Oriented X 3.  Neuro:   No F.N deficits, ALL C.Nerves Intact, Strength 5/5 all 4 extremities, Sensation intact all  4 extremities.  ENT:  Ears appear Normal, Conjunctivae clear but reddened secondary to chronic dry eye, PERRLA. Moist Oral Mucosa. Rhino Rocket in place per right narewithout any evidence of fresh bleeding  Neck:  Supple Neck, No JVD, No cervical lymphadenopathy appreciated  Respiratory:  Symmetrical Chest wall movement, Good air movement bilaterally, CTAB.  Cardiac:  Irregular with frequent PACs otherwise maintaining sinus rhythm, No Gallops, Rubs or Murmurs, No Parasternal Heave. Carotid bruits  Abdomen:  Positive Bowel Sounds, Abdomen Soft, Non tender, No organomegaly appriciated  Skin:  No Cyanosis, Normal Skin Turgor, No Skin Rash or Bruise.  Extremities:  Good muscle tone,  joints appear normal , no effusions, Normal ROM.   Data Review  CBC  Recent Labs Lab 08/25/14 0056 08/27/14 1146  WBC  --  15.7*  HGB 14.3 12.0*  HCT 42.0 35.1*  PLT  --  162  MCV  --  90.2  MCH  --  30.8  MCHC  --  34.2  RDW  --  14.5   ------------------------------------------------------------------------------------------------------------------  Chemistries   Recent Labs Lab 08/25/14 0056 08/27/14 1146  NA 144 137  K 4.6 3.4*  CL 109 102  CO2  --  25  GLUCOSE 133* 160*  BUN 43* 19  CREATININE 1.60* 1.61*  CALCIUM  --  9.3   ------------------------------------------------------------------------------------------------------------------ estimated creatinine clearance is 46.8 mL/min (by C-G formula based on Cr of 1.61). ------------------------------------------------------------------------------------------------------------------ No results for input(s): TSH, T4TOTAL, T3FREE, THYROIDAB in the last 72 hours.  Invalid input(s): FREET3   Coagulation profile  Recent Labs Lab  08/27/14 1146  INR 1.74*   ------------------------------------------------------------------------------------------------------------------- No results for input(s): DDIMER in the last 72 hours. -------------------------------------------------------------------------------------------------------------------  Cardiac Enzymes No results for input(s): CKMB, TROPONINI, MYOGLOBIN in the last 168 hours.  Invalid input(s): CK ------------------------------------------------------------------------------------------------------------------ Invalid input(s): POCBNP   ---------------------------------------------------------------------------------------------------------------  Urinalysis    Component Value Date/Time   COLORURINE AMBER* 08/27/2014 1345   APPEARANCEUR CLOUDY* 08/27/2014 1345   LABSPEC 1.017 08/27/2014 1345   PHURINE 5.5 08/27/2014 1345   GLUCOSEU NEGATIVE 08/27/2014 1345   HGBUR NEGATIVE 08/27/2014 1345   BILIRUBINUR NEGATIVE 08/27/2014 1345   KETONESUR NEGATIVE 08/27/2014 1345   PROTEINUR NEGATIVE 08/27/2014 1345   UROBILINOGEN 0.2 08/27/2014 1345   NITRITE NEGATIVE 08/27/2014 1345   LEUKOCYTESUR SMALL* 08/27/2014 1345    ----------------------------------------------------------------------------------------------------------------  Imaging results:   Dg Chest 2 View  08/27/2014   CLINICAL DATA:  Weakness.  No chest complaints.  EXAM: CHEST  2 VIEW  COMPARISON:  05/19/2006  FINDINGS: Stable cardiac and mediastinal contours, upper limits of normal to mildly enlarged. No large consolidative pulmonary opacities. No pleural effusion or pneumothorax. Regional skeleton is unremarkable.  IMPRESSION: No acute cardiopulmonary process.   Electronically Signed   By: Lovey Newcomer M.D.   On: 08/27/2014 14:04    My personal review of EKG: Rhythm NSR, Rate  143 /min, right axis deviation ,QTc 497 , no Acute ST changes    Assessment & Plan  Active Problems:   Atrial  fibrillation with RVR -Appreciate cardiology input -Previously failed sotalol but typically rate controlled on Cardizem at home -Continue IV Cardizem for now since still having short bursts of minimal tachycardia; suspect mediated by evolving symptomatic anemia    Near syncope -Patient appears clinically dehydrated and was noted to be orthostatic -Likely mediated by evolving symptomatic anemia -cont IV fluids at 125 mL per hour -As precaution cycle cardiac enzymes    Essential hypertension -Orthostatic at presentation so Cardizem and Lotensin to  remain on hold -Echocardiogram August 2014 with preserved LV function with mild focal basal hypertrophy of the septum and no valvular abnormalities    CKD (chronic kidney disease), stage III -Renal function stable and at baseline    Chronic anticoagulation -We will resume Xarelto but ask pharmacy to calculate dosage based on CrCl -Patient has utilize Coumadin and past but desires to remain on NOAC    Symptomatic anemia -Hemoglobin has dropped 2 g since 1/20 -Since no further bleeding since that ER visit likely this represents true equilibration but given the fact he also appears dehydrated will need to follow CBC every 12 hours with rehydration -Low threshold for transfusion; i.e. transfuse only if hemoglobin less than or equal to 8.0       DVT Prophylaxis: Full dose Xarelto her pharmacy  Family Communication:   I family at bedside   Code Status:  Full   Likely DC to  Home; hopefully within 24 - 48 hours  Condition:  Stable  Time spent in minutes : 60    ELLIS,ALLISON L. ANP on 08/27/2014 at 4:47 PM  Between 7am to 7pm - Pager: [phone]  After 7pm go to [email or web address] - password [email or web address]  And look for the night coverage person covering me after hours  [facility] Office  [phone]  I have directly reviewed the clinical findings, lab results and imaging studies. I have interviewed and examined the patient and  agree with the documentation and management as recorded by the Physician extender.  Patient continued on cardizem drip, showing afib on monitor, rate high 90's to low 100's. Bp stable. On exam, patient does look dehydrated. On ivf.  Shawna Wearing M.D on 08/27/2014 at 5:38 PM  [facility] Office  [phone]

## 2014-08-28 DIAGNOSIS — I10 Essential (primary) hypertension: Secondary | ICD-10-CM

## 2014-08-28 DIAGNOSIS — R55 Syncope and collapse: Secondary | ICD-10-CM

## 2014-08-28 DIAGNOSIS — N183 Chronic kidney disease, stage 3 (moderate): Secondary | ICD-10-CM

## 2014-08-28 DIAGNOSIS — R040 Epistaxis: Secondary | ICD-10-CM

## 2014-08-28 DIAGNOSIS — I4891 Unspecified atrial fibrillation: Secondary | ICD-10-CM

## 2014-08-28 LAB — BASIC METABOLIC PANEL
ANION GAP: 8 (ref 5–15)
BUN: 16 mg/dL (ref 6–23)
CO2: 26 mmol/L (ref 19–32)
Calcium: 8.4 mg/dL (ref 8.4–10.5)
Chloride: 106 mmol/L (ref 96–112)
Creatinine, Ser: 1.3 mg/dL (ref 0.50–1.35)
GFR calc non Af Amer: 52 mL/min — ABNORMAL LOW (ref >90)
GFR, EST AFRICAN AMERICAN: 61 mL/min — AB (ref >90)
Glucose, Bld: 110 mg/dL — ABNORMAL HIGH (ref 70–99)
Potassium: 3.8 mmol/L (ref 3.5–5.1)
Sodium: 140 mmol/L (ref 135–145)

## 2014-08-28 LAB — CBC
HCT: 32.1 % — ABNORMAL LOW (ref 39.0–52.0)
HEMATOCRIT: 34 % — AB (ref 39.0–52.0)
HEMOGLOBIN: 10.7 g/dL — AB (ref 13.0–17.0)
HEMOGLOBIN: 11.3 g/dL — AB (ref 13.0–17.0)
MCH: 30.5 pg (ref 26.0–34.0)
MCH: 30.4 pg (ref 26.0–34.0)
MCHC: 33.3 g/dL (ref 30.0–36.0)
MCHC: 33.2 g/dL (ref 30.0–36.0)
MCV: 91.5 fL (ref 78.0–100.0)
MCV: 91.4 fL (ref 78.0–100.0)
Platelets: 137 10*3/uL — ABNORMAL LOW (ref 150–400)
Platelets: 171 10*3/uL (ref 150–400)
RBC: 3.51 MIL/uL — ABNORMAL LOW (ref 4.22–5.81)
RBC: 3.72 MIL/uL — AB (ref 4.22–5.81)
RDW: 14.7 % (ref 11.5–15.5)
RDW: 14.6 % (ref 11.5–15.5)
WBC: 9.8 10*3/uL (ref 4.0–10.5)
WBC: 9.3 10*3/uL (ref 4.0–10.5)

## 2014-08-28 LAB — APTT: aPTT: 75 seconds — ABNORMAL HIGH (ref 24–37)

## 2014-08-28 LAB — TROPONIN I: Troponin I: <0.03 ng/mL (ref <0.031)

## 2014-08-28 LAB — HEPARIN LEVEL (UNFRACTIONATED): Heparin Unfractionated: 0.69 IU/mL (ref 0.30–0.70)

## 2014-08-28 MED ORDER — CEPHALEXIN 500 MG PO CAPS
500.0000 mg | ORAL_CAPSULE | Freq: Three times a day (TID) | ORAL | Status: DC
  Administered 2014-08-28 – 2014-09-01 (×13): 500 mg via ORAL
  Filled 2014-08-28 (×15): qty 1

## 2014-08-28 MED ORDER — METOPROLOL TARTRATE 25 MG PO TABS
25.0000 mg | ORAL_TABLET | Freq: Two times a day (BID) | ORAL | Status: DC
  Administered 2014-08-28 (×2): 25 mg via ORAL
  Filled 2014-08-28 (×2): qty 1

## 2014-08-28 MED ORDER — HEPARIN (PORCINE) IN NACL 100-0.45 UNIT/ML-% IJ SOLN
1300.0000 [IU]/h | INTRAMUSCULAR | Status: DC
  Administered 2014-08-28 – 2014-08-29 (×2): 1300 [IU]/h via INTRAVENOUS
  Filled 2014-08-28 (×3): qty 250

## 2014-08-28 MED ORDER — POTASSIUM CHLORIDE CRYS ER 20 MEQ PO TBCR
20.0000 meq | EXTENDED_RELEASE_TABLET | Freq: Every day | ORAL | Status: DC
  Administered 2014-08-28 – 2014-08-29 (×2): 20 meq via ORAL
  Filled 2014-08-28 (×2): qty 1

## 2014-08-28 NOTE — Progress Notes (Signed)
ANTICOAGULATION CONSULT NOTE - Initial Consult  Pharmacy Consult for Heparin Indication: atrial fibrillation  Allergies  Allergen Reactions  . Cipro Iv [Ciprofloxacin]   . Hctz [Hydrochlorothiazide] Photosensitivity    Patient Measurements: Height: 6\' 2"  (188 cm) Weight: 198 lb 13.7 oz (90.2 kg) IBW/kg (Calculated) : 82.2  Vital Signs: Temp: 97.4 F (36.3 C) (01/23 1100) Temp Source: Oral (01/23 1100) BP: 143/85 mmHg (01/23 1100) Pulse Rate: 90 (01/23 1100)  Labs:  Recent Labs  08/27/14 1146 08/27/14 1803 08/27/14 2213 08/28/14 0519  HGB 12.0* 11.2*  --  10.7*  HCT 35.1* 33.2*  --  32.1*  PLT 162 147*  --  137*  LABPROT 20.5*  --   --   --   INR 1.74*  --   --   --   CREATININE 1.61*  --   --  1.30  TROPONINI  --  <0.03 <0.03 <0.03    Estimated Creatinine Clearance: 58 mL/min (by C-G formula based on Cr of 1.3).   Medical History: Past Medical History  Diagnosis Date  . Chronic atrial fibrillation   . Hypertension   . Erectile dysfunction   . Hypokalemia   . Meningioma     Probable 13 x 12 mm meningioma in right frontal region  . Thrombocytopenia     improved  . Chronic dermatitis   . CKD (chronic kidney disease), stage III     a. Probable based on historical Cr. H/o ARF. Previously saw Dr. Florene Glen.    Medications:  Scheduled:  . cyanocobalamin  500 mcg Oral Daily  . metoprolol tartrate  25 mg Oral BID  . multivitamin with minerals  1 tablet Oral Daily  . omega-3 acid ethyl esters  1 g Oral Daily  . potassium chloride SA  20 mEq Oral Daily  . sodium chloride  3 mL Intravenous Q12H  . vitamin C  1,000 mg Oral Daily   Infusions:  . sodium chloride 125 mL/hr at 08/27/14 2315  . diltiazem (CARDIZEM) infusion 5 mg/hr (08/28/14 0150)    Assessment: 74 yo m admitted on 1/22. Pt seen on 1/20 in the ED for difficult to control epistaxis.  Pt returned to ED on 1/22 with weakness and near syncope episode.  Pt is on Xarelto PTA for afib. Xarelto was  resumed last night, but stopped this AM and pharmacy is consulted to begin heparin for afib.  Will not bolus due to recent bleed.  Hgb 10.7, plts 137. Monitor closely for bleeding.   Goal of Therapy:  Heparin level 0.3-0.7 units/ml Monitor platelets by anticoagulation protocol: Yes   Plan:  Heparin infusion at 1300 units/hr (~14.4 units/kg/hr) HL @ 2000 tonight Daily HL and CBC Monitor closely for bleeding F/u restarting oral AC (per MD, no oral AC until nasal tampon removed and no evidence of bleeding)  Cassie L. Nicole Kindred, PharmD Clinical Pharmacy Resident Pager: [phone] 08/28/2014 11:55 AM

## 2014-08-28 NOTE — Progress Notes (Signed)
ANTICOAGULATION CONSULT NOTE - Follow Up Consult  Pharmacy Consult for Heparin Indication: atrial fibrillation  Allergies  Allergen Reactions  . Cipro Iv [Ciprofloxacin]   . Hctz [Hydrochlorothiazide] Photosensitivity    Patient Measurements: Height: 6\' 2"  (188 cm) Weight: 198 lb 13.7 oz (90.2 kg) IBW/kg (Calculated) : 82.2  Vital Signs: Temp: 97.9 F (36.6 C) (01/23 1954) Temp Source: Oral (01/23 1954) BP: 172/103 mmHg (01/23 2000) Pulse Rate: 98 (01/23 2000)  Labs:  Recent Labs  08/27/14 1146 08/27/14 1803 08/27/14 2213 08/28/14 0519 08/28/14 1827 08/28/14 2000  HGB 12.0* 11.2*  --  10.7* 11.3*  --   HCT 35.1* 33.2*  --  32.1* 34.0*  --   PLT 162 147*  --  137* 171  --   APTT  --   --   --   --  75*  --   LABPROT 20.5*  --   --   --   --   --   INR 1.74*  --   --   --   --   --   HEPARINUNFRC  --   --   --   --   --  0.69  CREATININE 1.61*  --   --  1.30  --   --   TROPONINI  --  <0.03 <0.03 <0.03  --   --     Estimated Creatinine Clearance: 58 mL/min (by C-G formula based on Cr of 1.3).   Medical History: Past Medical History  Diagnosis Date  . Chronic atrial fibrillation   . Hypertension   . Erectile dysfunction   . Hypokalemia   . Meningioma     Probable 13 x 12 mm meningioma in right frontal region  . Thrombocytopenia     improved  . Chronic dermatitis   . CKD (chronic kidney disease), stage III     a. Probable based on historical Cr. H/o ARF. Previously saw Dr. Florene Glen.    Medications:  Scheduled:  . cephALEXin  500 mg Oral 3 times per day  . cyanocobalamin  500 mcg Oral Daily  . metoprolol tartrate  25 mg Oral BID  . multivitamin with minerals  1 tablet Oral Daily  . omega-3 acid ethyl esters  1 g Oral Daily  . potassium chloride SA  20 mEq Oral Daily  . sodium chloride  3 mL Intravenous Q12H  . vitamin C  1,000 mg Oral Daily   Infusions:  . sodium chloride 75 mL/hr at 08/28/14 2000  . diltiazem (CARDIZEM) infusion 5 mg/hr (08/28/14  2000)  . heparin 1,300 Units/hr (08/28/14 2000)    Assessment: 74 yo m admitted on 1/22. Pt seen on 1/20 in the ED for difficult to control epistaxis.  Pt returned to ED on 1/22 with weakness and near syncope episode.  Pt is on Xarelto PTA for afib. Xarelto was resumed last night, but stopped this AM and pharmacy is consulted to begin heparin for afib.    Hgb 10.7, plts 137. Monitor closely for bleeding.  Heparin 1300 uts/hr for Afib checking aPTT 75sec - drawn a little early - HL 0.69 - looks like slightly affected by xarelto but clearing - will dose from HL starting tomorrow AM - no change tonight  Goal of Therapy:  Heparin level 0.3-0.7 units/ml Monitor platelets by anticoagulation protocol: Yes   Plan:  Heparin infusion at 1300 units/hr (~14.4 units/kg/hr) Daily HL and CBC Monitor closely for bleeding F/u restarting oral AC (per MD, no oral AC until nasal tampon  removed and no evidence of bleeding)  Bonnita Nasuti Pharm.D. CPP, BCPS Clinical Pharmacist [phone] 08/28/2014 8:52 PM

## 2014-08-28 NOTE — Progress Notes (Signed)
[facility] TEAM 1 - Stepdown/ICU TEAM Progress Note  BRANDI ARMATO [id number]:[id number] DOB: [birth year] DOA: 08/27/2014 PCP: Annye Asa, MD  Admit HPI / Brief Narrative: 74 yo male w/ a history of chronic atrial fibrillation on Xarelto, chronic kidney disease stage II-III, and hypertension who was seen in the ER 1/20 for difficult to control epistaxis. Was treated with Afrin and a Rhino Rocket and according to the patient has an outpatient ENT appointment scheduled for Monday 1/25. The patient apparently had extensive epistaxis but hemoglobin was 14.3. He returned to the ER 1/22 with complaints of weakness and near syncope. Denied having chest pain or shortness of breath prior to onset of these symptoms.   Upon arrival to the ER patient was found to be in atrial fibrillation with ventricular response in the 130s. He had white count of 15,700 and his hemoglobin had dropped to 12.0. He was also somewhat orthostatic with blood pressures decreasing from 170/94 to 144/96. His BUN on 1/20 was 43 but had decreased to 19. He was started on IV Cardizem drip with subsequent conversion to sinus rhythm. He told me that he continue to take his usual Xarelto since the recent ER visit and has had no further nasal bleeding. He was told to expect either melena or dark stools which he has had since the nosebleed.  HPI/Subjective: Pt is resting comfortably at the present time.  He denies dizziness, cp, sob, n/v, or abdom pain. He is tolerating his nasal tampon device well.  He has not noted any further epistaxis.    Assessment/Plan:  Epistaxis  xarelto was resumed last PM, and heparin stopped - will resume heparin and stop oral agent for now - consider change to pradaxa as it now has a reversing agent - discussed nasal tampon w/ on call ENT MD who suggested it should remain in place for 5 days, and also suggested empiric keflex tx - pt is scheduled to f/u w/ Dr. Simeon Craft on Monday at 1pm   Chronic afib on xarelto w/  acute RVR Cardiology following - patient has used Coumadin in the past but desires to remain on NOAC - rate now controlled - Cards to attempt to transition to oral meds   Near syncope - symptomatic anemia due to acute blood loss appeared clinically dehydrated at presentation - likely mediated by acute blood loss + RVR - Hgb has dropped ~4 g since 1/20 - cont gently hydration   HTN BP currently reasonably controlled  Hypokalemia Replaced   CKD stage 3 Renal function stable at baseline  Code Status: FULL Family Communication: no family present at time of exam Disposition Plan: SDU until off IV gtt   Consultants: [facility] Cards  Procedures: none  Antibiotics: none  DVT prophylaxis: IV heparin   Objective: Blood pressure 139/77, pulse 96, temperature 97.2 F (36.2 C), temperature source Oral, resp. rate 15, height 6\' 2"  (1.88 m), weight 90.2 kg (198 lb 13.7 oz), SpO2 100 %.  Intake/Output Summary (Last 24 hours) at 08/28/14 1041 Last data filed at 08/28/14 0600  Gross per 24 hour  Intake   1670 ml  Output   1050 ml  Net    620 ml   Exam: General: No acute respiratory distress HEENT:  R nasal tampon pack in place - no d/c, epistaxis, or foul odor Lungs: Clear to auscultation bilaterally without wheezes or crackles Cardiovascular: irreg irreg - rate 80BPM - no appreciable M Abdomen: Nontender, nondistended, soft, bowel sounds positive, no rebound, no ascites, no  appreciable mass Extremities: No significant cyanosis, clubbing, or edema bilateral lower extremities  Data Reviewed: Basic Metabolic Panel:  Recent Labs Lab 08/25/14 0056 08/27/14 1146 08/28/14 0519  NA 144 137 140  K 4.6 3.4* 3.8  CL 109 102 106  CO2  --  25 26  GLUCOSE 133* 160* 110*  BUN 43* 19 16  CREATININE 1.60* 1.61* 1.30  CALCIUM  --  9.3 8.4    Liver Function Tests: No results for input(s): AST, ALT, ALKPHOS, BILITOT, PROT, ALBUMIN in the last 168 hours. No results for input(s): LIPASE,  AMYLASE in the last 168 hours. No results for input(s): AMMONIA in the last 168 hours.  Coags:  Recent Labs Lab 08/27/14 1146  INR 1.74*   CBC:  Recent Labs Lab 08/25/14 0056 08/27/14 1146 08/27/14 1803 08/28/14 0519  WBC  --  15.7* 12.1* 9.8  HGB 14.3 12.0* 11.2* 10.7*  HCT 42.0 35.1* 33.2* 32.1*  MCV  --  90.2 90.2 91.5  PLT  --  162 147* 137*   Cardiac Enzymes:  Recent Labs Lab 08/27/14 1803 08/27/14 2213 08/28/14 0519  TROPONINI <0.03 <0.03 <0.03    Recent Results (from the past 240 hour(s))  MRSA PCR Screening     Status: None   Collection Time: 08/27/14  4:57 PM  Result Value Ref Range Status   MRSA by PCR NEGATIVE NEGATIVE Final    Comment:        The GeneXpert MRSA Assay (FDA approved for NASAL specimens only), is one component of a comprehensive MRSA colonization surveillance program. It is not intended to diagnose MRSA infection nor to guide or monitor treatment for MRSA infections.      Studies:  Recent x-ray studies have been reviewed in detail by the Attending Physician  Scheduled Meds:  Scheduled Meds: . cyanocobalamin  500 mcg Oral Daily  . metoprolol tartrate  25 mg Oral BID  . multivitamin with minerals  1 tablet Oral Daily  . omega-3 acid ethyl esters  1 g Oral Daily  . rivaroxaban  20 mg Oral Q supper  . sodium chloride  3 mL Intravenous Q12H  . vitamin C  1,000 mg Oral Daily    Time spent on care of this patient: 35 mins   Stanislaus Kaltenbach T , MD   [facility] Office  [phone] Pager - Text Page per Shea Evans as per below:  On-Call/Text Page:      [email or web address]      password [email or web address]  If 7PM-7AM, please contact night-coverage [email or web address] Password [email or web address] 08/28/2014, 10:41 AM   LOS: 1 day

## 2014-08-28 NOTE — Progress Notes (Signed)
Patient ID: [name], male   DOB: 08/19/1940, 74 y.o.   MRN: [id number] CARDIOLOGY CONSULT NOTE       Subjective:  Nose hurts a bit no cardiac complaints      ROS otherwise negative     Meds:   . cyanocobalamin  500 mcg Oral Daily  . multivitamin with minerals  1 tablet Oral Daily  . omega-3 acid ethyl esters  1 g Oral Daily  . rivaroxaban  20 mg Oral Q supper  . sodium chloride  3 mL Intravenous Q12H  . vitamin C  1,000 mg Oral Daily   . sodium chloride 125 mL/hr at 08/27/14 2315  . diltiazem (CARDIZEM) infusion 5 mg/hr (08/28/14 0150)    Physical Exam: Blood pressure 139/77, pulse 96, temperature 97.2 F (36.2 C), temperature source Oral, resp. rate 15, height 6\' 2"  (1.88 m), weight 90.2 kg (198 lb 13.7 oz), SpO2 100 %.    Affect appropriate Healthy:  appears stated age 4: Right nares with inflatable pack  Neck supple with no adenopathy JVP normal no bruits no thyromegaly Lungs clear with no wheezing and good diaphragmatic motion Heart:  S1/S2 no murmur, no rub, gallop or click PMI normal Abdomen: benighn, BS positve, no tenderness, no AAA no bruit.  No HSM or HJR Distal pulses intact with no bruits No edema Neuro non-focal Skin warm and dry No muscular weakness   Labs:   Lab Results  Component Value Date   WBC 9.8 08/28/2014   HGB 10.7* 08/28/2014   HCT 32.1* 08/28/2014   MCV 91.5 08/28/2014   PLT 137* 08/28/2014    Recent Labs Lab 08/28/14 0519  NA 140  K 3.8  CL 106  CO2 26  BUN 16  CREATININE 1.30  CALCIUM 8.4  GLUCOSE 110*   Lab Results  Component Value Date   TROPONINI <0.03 08/28/2014    Lab Results  Component Value Date   CHOL 129 11/11/2013   CHOL 158 11/11/2012   CHOL 148 11/08/2011   Lab Results  Component Value Date   HDL 44.80 11/11/2013   HDL 38.90* 11/11/2012   HDL 47.10 11/08/2011   Lab Results  Component Value Date   LDLCALC 67 11/11/2013   LDLCALC 91 11/11/2012   LDLCALC 86 11/08/2011   Lab  Results  Component Value Date   TRIG 84.0 11/11/2013   TRIG 140.0 11/11/2012   TRIG 73.0 11/08/2011   Lab Results  Component Value Date   CHOLHDL 3 11/11/2013   CHOLHDL 4 11/11/2012   CHOLHDL 3 11/08/2011   No results found for: LDLDIRECT    Radiology: Dg Chest 2 View  08/27/2014   CLINICAL DATA:  Weakness.  No chest complaints.  EXAM: CHEST  2 VIEW  COMPARISON:  05/19/2006  FINDINGS: Stable cardiac and mediastinal contours, upper limits of normal to mildly enlarged. No large consolidative pulmonary opacities. No pleural effusion or pneumothorax. Regional skeleton is unremarkable.  IMPRESSION: No acute cardiopulmonary process.   Electronically Signed   By: Lovey Newcomer M.D.   On: 08/27/2014 14:04    EKG:  afib rate 143 no ST/T wave changes    ASSESSMENT AND PLAN:  Afib:  On heparin would not restart xarelto until pack out and 24 hours of no bleeding  Given interaction of cardizem with xarelto will change to beta blockers for rate control  Would also consider changing xarelto to pradaxa since there is a monoclonal reversal agent for it now and he may have recurrent nose  bleeds  Signed: Jenkins Rouge 08/28/2014, 9:57 AM

## 2014-08-29 LAB — COMPREHENSIVE METABOLIC PANEL
ALK PHOS: 61 U/L (ref 39–117)
ALT: 12 U/L (ref 0–53)
ANION GAP: 10 (ref 5–15)
AST: 18 U/L (ref 0–37)
Albumin: 3.1 g/dL — ABNORMAL LOW (ref 3.5–5.2)
BUN: 14 mg/dL (ref 6–23)
CALCIUM: 8 mg/dL — AB (ref 8.4–10.5)
CO2: 22 mmol/L (ref 19–32)
Chloride: 105 mmol/L (ref 96–112)
Creatinine, Ser: 1.1 mg/dL (ref 0.50–1.35)
GFR calc non Af Amer: 64 mL/min — ABNORMAL LOW (ref >90)
GFR, EST AFRICAN AMERICAN: 74 mL/min — AB (ref >90)
Glucose, Bld: 127 mg/dL — ABNORMAL HIGH (ref 70–99)
POTASSIUM: 3.3 mmol/L — AB (ref 3.5–5.1)
Sodium: 137 mmol/L (ref 135–145)
Total Bilirubin: 1 mg/dL (ref 0.3–1.2)
Total Protein: 5.7 g/dL — ABNORMAL LOW (ref 6.0–8.3)

## 2014-08-29 LAB — HEPARIN LEVEL (UNFRACTIONATED)
HEPARIN UNFRACTIONATED: 1.58 [IU]/mL — AB (ref 0.30–0.70)
Heparin Unfractionated: 0.48 IU/mL (ref 0.30–0.70)

## 2014-08-29 LAB — APTT: aPTT: 84 seconds — ABNORMAL HIGH (ref 24–37)

## 2014-08-29 LAB — CBC
HCT: 29 % — ABNORMAL LOW (ref 39.0–52.0)
Hemoglobin: 9.8 g/dL — ABNORMAL LOW (ref 13.0–17.0)
MCH: 30.6 pg (ref 26.0–34.0)
MCHC: 33.8 g/dL (ref 30.0–36.0)
MCV: 90.6 fL (ref 78.0–100.0)
Platelets: 154 10*3/uL (ref 150–400)
RBC: 3.2 MIL/uL — ABNORMAL LOW (ref 4.22–5.81)
RDW: 14.3 % (ref 11.5–15.5)
WBC: 9.3 10*3/uL (ref 4.0–10.5)

## 2014-08-29 MED ORDER — POTASSIUM CHLORIDE CRYS ER 20 MEQ PO TBCR
40.0000 meq | EXTENDED_RELEASE_TABLET | Freq: Two times a day (BID) | ORAL | Status: AC
  Administered 2014-08-29 – 2014-08-30 (×3): 40 meq via ORAL
  Filled 2014-08-29 (×3): qty 2

## 2014-08-29 MED ORDER — DABIGATRAN ETEXILATE MESYLATE 150 MG PO CAPS
150.0000 mg | ORAL_CAPSULE | Freq: Two times a day (BID) | ORAL | Status: DC
  Administered 2014-08-29 – 2014-09-01 (×7): 150 mg via ORAL
  Filled 2014-08-29 (×8): qty 1

## 2014-08-29 MED ORDER — METOPROLOL TARTRATE 50 MG PO TABS
50.0000 mg | ORAL_TABLET | Freq: Two times a day (BID) | ORAL | Status: DC
  Administered 2014-08-29 – 2014-08-31 (×5): 50 mg via ORAL
  Filled 2014-08-29 (×6): qty 1

## 2014-08-29 NOTE — Progress Notes (Signed)
[place] TEAM 1 - Stepdown/ICU TEAM Progress Note  NARAYAN SCULL KTG:[id number] DOB: 1940-10-15 DOA: 08/27/2014 PCP: Annye Asa, MD  Admit HPI / Brief Narrative: 74 yo male w/ a history of chronic atrial fibrillation on Xarelto, chronic kidney disease stage II-III, and hypertension who was seen in the ER 1/20 for difficult to control epistaxis. Was treated with Afrin and a Rhino Rocket and according to the patient has an outpatient ENT appointment scheduled for Monday 1/25. The patient apparently had extensive epistaxis but hemoglobin was 14.3. He returned to the ER 1/22 with complaints of weakness and near syncope. Denied having chest pain or shortness of breath prior to onset of these symptoms.   Upon arrival to the ER patient was found to be in atrial fibrillation with ventricular response in the 130s. He had white count of 15,700 and his hemoglobin had dropped to 12.0. He was also somewhat orthostatic with blood pressures decreasing from 170/94 to 144/96. His BUN on 1/20 was 43 but had decreased to 19. He was started on IV Cardizem drip with subsequent conversion to sinus rhythm. He told me that he continue to take his usual Xarelto since the recent ER visit and has had no further nasal bleeding. He was told to expect either melena or dark stools which he has had since the nosebleed.  HPI/Subjective: Pt has no new complaints today.  He is quite anxious about "being sent home too soon."  He relates some mild discomfort related to his nasal tampon, but o/w denies cp, n/v, abdom pain, or sob.    Assessment/Plan:  Epistaxis  Cards has transitioned from heparin to Pradaxa after discussing this w/ the pt - I agree w/ this decision - discussed nasal tampon w/ on call ENT MD who suggested it should remain in place for 5 days total, and also suggested empiric keflex tx - pt is scheduled to f/u w/ Dr. Simeon Craft on Monday at 1pm, but pt requests that the packing be removed while he is still in the  hospital - I feel this is reasonably/sensible, and will ask ENT to see him in the morning  Chronic afib on prior xarelto w/ acute RVR Cardiology following - patient has used Coumadin in the past but desires to remain on NOAC - rate now reasonably controlled - Cards has transitioned to Pradaxa - pt remains on cardizem gtt presently  Near syncope - symptomatic anemia due to acute blood loss appeared clinically dehydrated at presentation - likely mediated by acute blood loss + RVR - Hgb has dropped ~4 g since 1/20 - cont gently hydration - begin to ambulate w/ assist   HTN BP currently reasonably controlled  Hypokalemia Replace and follow - goal is K+ 4.0  CKD stage 3 Renal function stable at baseline  Code Status: FULL Family Communication: no family present at time of exam Disposition Plan: SDU as pt on cardizem gtt - remove nasal packing 1/25 and monitor for recurrent epistaxis on Pradaxa   Consultants: [facility] Cards  Procedures: none  Antibiotics: Keflex 1/23 >  DVT prophylaxis: IV heparin > pradaxa   Objective: Blood pressure 145/94, pulse 100, temperature 98.1 F (36.7 C), temperature source Oral, resp. rate 19, height 6\' 2"  (1.88 m), weight 90.2 kg (198 lb 13.7 oz), SpO2 100 %.  Intake/Output Summary (Last 24 hours) at 08/29/14 1018 Last data filed at 08/29/14 1000  Gross per 24 hour  Intake   2611 ml  Output   3925 ml  Net  -1314  ml   Exam: General: No acute respiratory distress HEENT:  R nasal tampon pack in place - no d/c, epistaxis, or foul odor Lungs: Clear to auscultation bilaterally without wheezes or crackles Cardiovascular: irreg irreg - rate 105BPM - no appreciable M Abdomen: Nontender, nondistended, soft, bowel sounds positive, no rebound, no ascites, no appreciable mass Extremities: No significant cyanosis, clubbing, or edema bilateral lower extremities  Data Reviewed: Basic Metabolic Panel:  Recent Labs Lab 08/25/14 0056 08/27/14 1146  08/28/14 0519 08/29/14 0304  NA 144 137 140 137  K 4.6 3.4* 3.8 3.3*  CL 109 102 106 105  CO2  --  25 26 22   GLUCOSE 133* 160* 110* 127*  BUN 43* 19 16 14   CREATININE 1.60* 1.61* 1.30 1.10  CALCIUM  --  9.3 8.4 8.0*    Liver Function Tests:  Recent Labs Lab 08/29/14 0304  AST 18  ALT 12  ALKPHOS 61  BILITOT 1.0  PROT 5.7*  ALBUMIN 3.1*    Coags:  Recent Labs Lab 08/27/14 1146  INR 1.74*   CBC:  Recent Labs Lab 08/27/14 1146 08/27/14 1803 08/28/14 0519 08/28/14 1827 08/29/14 0304  WBC 15.7* 12.1* 9.8 9.3 9.3  HGB 12.0* 11.2* 10.7* 11.3* 9.8*  HCT 35.1* 33.2* 32.1* 34.0* 29.0*  MCV 90.2 90.2 91.5 91.4 90.6  PLT 162 147* 137* 171 154   Cardiac Enzymes:  Recent Labs Lab 08/27/14 1803 08/27/14 2213 08/28/14 0519  TROPONINI <0.03 <0.03 <0.03    Recent Results (from the past 240 hour(s))  MRSA PCR Screening     Status: None   Collection Time: 08/27/14  4:57 PM  Result Value Ref Range Status   MRSA by PCR NEGATIVE NEGATIVE Final    Comment:        The GeneXpert MRSA Assay (FDA approved for NASAL specimens only), is one component of a comprehensive MRSA colonization surveillance program. It is not intended to diagnose MRSA infection nor to guide or monitor treatment for MRSA infections.      Studies:  Recent x-ray studies have been reviewed in detail by the Attending Physician  Scheduled Meds:  Scheduled Meds: . cephALEXin  500 mg Oral 3 times per day  . cyanocobalamin  500 mcg Oral Daily  . dabigatran  150 mg Oral Q12H  . metoprolol tartrate  50 mg Oral BID  . multivitamin with minerals  1 tablet Oral Daily  . omega-3 acid ethyl esters  1 g Oral Daily  . potassium chloride SA  20 mEq Oral Daily  . sodium chloride  3 mL Intravenous Q12H  . vitamin C  1,000 mg Oral Daily    Time spent on care of this patient: 35 mins   Sary Bogie T , MD   [facility] Office  [phone] Pager - Text Page per Shea Evans as per  below:  On-Call/Text Page:      [email or web address]      password [email or web address]  If 7PM-7AM, please contact night-coverage [email or web address] Password [email or web address] 08/29/2014, 10:18 AM   LOS: 2 days

## 2014-08-29 NOTE — Progress Notes (Signed)
Utilization review completed.  

## 2014-08-29 NOTE — Progress Notes (Addendum)
ANTICOAGULATION CONSULT NOTE - Follow Up Consult  Pharmacy Consult for Heparin/Pradaxa Indication: atrial fibrillation  Allergies  Allergen Reactions  . Cipro Iv [Ciprofloxacin]   . Hctz [Hydrochlorothiazide] Photosensitivity    Patient Measurements: Height: 6\' 2"  (188 cm) Weight: 198 lb 13.7 oz (90.2 kg) IBW/kg (Calculated) : 82.2  Vital Signs: Temp: 98.1 F (36.7 C) (01/24 0800) Temp Source: Oral (01/24 0800) BP: 151/110 mmHg (01/24 0800) Pulse Rate: 100 (01/24 0800)  Labs:  Recent Labs  08/27/14 1146 08/27/14 1803 08/27/14 2213 08/28/14 0519 08/28/14 1827 08/28/14 2000 08/29/14 0304 08/29/14 0700 08/29/14 0745  HGB 12.0* 11.2*  --  10.7* 11.3*  --  9.8*  --   --   HCT 35.1* 33.2*  --  32.1* 34.0*  --  29.0*  --   --   PLT 162 147*  --  137* 171  --  154  --   --   APTT  --   --   --   --  75*  --   --  84*  --   LABPROT 20.5*  --   --   --   --   --   --   --   --   INR 1.74*  --   --   --   --   --   --   --   --   HEPARINUNFRC  --   --   --   --   --  0.69 1.58*  --  0.48  CREATININE 1.61*  --   --  1.30  --   --  1.10  --   --   TROPONINI  --  <0.03 <0.03 <0.03  --   --   --   --   --     Estimated Creatinine Clearance: 68.5 mL/min (by C-G formula based on Cr of 1.1).   Medications:  Scheduled:  . cephALEXin  500 mg Oral 3 times per day  . cyanocobalamin  500 mcg Oral Daily  . metoprolol tartrate  25 mg Oral BID  . multivitamin with minerals  1 tablet Oral Daily  . omega-3 acid ethyl esters  1 g Oral Daily  . potassium chloride SA  20 mEq Oral Daily  . sodium chloride  3 mL Intravenous Q12H  . vitamin C  1,000 mg Oral Daily   Infusions:  . sodium chloride 75 mL/hr at 08/29/14 0549  . diltiazem (CARDIZEM) infusion 5 mg/hr (08/29/14 0000)  . heparin 1,300 Units/hr (08/29/14 0550)    Assessment: 74 yo m admitted on 1/22 after being seen in ED on 1/20 for epistaxis.  Presented back to ED with weakness. Patient on Xarelto PTA and received one dose  (1/22 @ 1728) of Xarelto in hospital.  Pharmacy is consulted to begin heparin. Initial aPTT was 75. No oral anticoagulation per MD while nasal tampon in place. HL this AM was drawn right above the site of infusion and found to be supratherapeutic at 1.58.  Redraw showed a therapeutic level at 0.48.  Will keep patient on current infusion rate.  Hgb 9.8, plts 154, no bleeding or trouble infusing per RN.  Goal of Therapy:  Heparin level 0.3-0.7 units/ml Monitor platelets by anticoagulation protocol: Yes   Plan:  Continue heparin infusion at 1300 units/hr 8-hr HL @ 1600 Daily HL and CBC Monitor for s/s of bleeding, hgb/plt trends, clinical course  Cassie L. Nicole Kindred, PharmD Clinical Pharmacy Resident Pager: [phone] 08/29/2014 8:47 AM  Heparin discontinued per  cards.  Pharmacy consulted to begin Pradaxa for afib.  Will begin Pradaxa 150 mg PO BID.    Thank you for allowing pharmacy to be a part of this patient's care.  Cassie L. Nicole Kindred, PharmD Clinical Pharmacy Resident Pager: [phone] 08/29/2014 9:07 AM

## 2014-08-29 NOTE — Progress Notes (Addendum)
Subjective:  Nose hurts a bit no cardiac complaints   ROS otherwise negative     Meds:   . cephALEXin  500 mg Oral 3 times per day  . cyanocobalamin  500 mcg Oral Daily  . metoprolol tartrate  25 mg Oral BID  . multivitamin with minerals  1 tablet Oral Daily  . omega-3 acid ethyl esters  1 g Oral Daily  . potassium chloride SA  20 mEq Oral Daily  . sodium chloride  3 mL Intravenous Q12H  . vitamin C  1,000 mg Oral Daily   . sodium chloride 75 mL/hr at 08/29/14 0549  . diltiazem (CARDIZEM) infusion 5 mg/hr (08/29/14 0000)  . heparin 1,300 Units/hr (08/29/14 0550)    Physical Exam: Blood pressure 151/110, pulse 100, temperature 98.1 F (36.7 C), temperature source Oral, resp. rate 19, height 6\' 2"  (1.88 m), weight 90.2 kg (198 lb 13.7 oz), SpO2 100 %.    Affect appropriate Healthy:  appears stated age 74: Right nares with inflatable pack  Neck supple with no adenopathy JVP normal no bruits no thyromegaly Lungs clear with no wheezing and good diaphragmatic motion Heart:  S1/S2 no murmur, no rub, gallop or click PMI normal Abdomen: benighn, BS positve, no tenderness, no AAA no bruit.  No HSM or HJR Distal pulses intact with no bruits No edema Neuro non-focal Skin warm and dry No muscular weakness   Labs:   Lab Results  Component Value Date   WBC 9.3 08/29/2014   HGB 9.8* 08/29/2014   HCT 29.0* 08/29/2014   MCV 90.6 08/29/2014   PLT 154 08/29/2014     Recent Labs Lab 08/29/14 0304  NA 137  K 3.3*  CL 105  CO2 22  BUN 14  CREATININE 1.10  CALCIUM 8.0*  PROT 5.7*  BILITOT 1.0  ALKPHOS 61  ALT 12  AST 18  GLUCOSE 127*   Lab Results  Component Value Date   TROPONINI <0.03 08/28/2014    Lab Results  Component Value Date   CHOL 129 11/11/2013   CHOL 158 11/11/2012   CHOL 148 11/08/2011   Lab Results  Component Value Date   HDL 44.80 11/11/2013   HDL 38.90* 11/11/2012   HDL 47.10 11/08/2011   Lab Results  Component Value Date     LDLCALC 67 11/11/2013   LDLCALC 91 11/11/2012   LDLCALC 86 11/08/2011   Lab Results  Component Value Date   TRIG 84.0 11/11/2013   TRIG 140.0 11/11/2012   TRIG 73.0 11/08/2011   Lab Results  Component Value Date   CHOLHDL 3 11/11/2013   CHOLHDL 4 11/11/2012   CHOLHDL 3 11/08/2011   No results found for: LDLDIRECT    Radiology: Dg Chest 2 View  08/27/2014   CLINICAL DATA:  Weakness.  No chest complaints.  EXAM: CHEST  2 VIEW  COMPARISON:  05/19/2006  FINDINGS: Stable cardiac and mediastinal contours, upper limits of normal to mildly enlarged. No large consolidative pulmonary opacities. No pleural effusion or pneumothorax. Regional skeleton is unremarkable.  IMPRESSION: No acute cardiopulmonary process.   Electronically Signed   By: Lovey Newcomer M.D.   On: 08/27/2014 14:04    EKG:  afib rate 143 no ST/T wave changes    ASSESSMENT AND PLAN:  Afib:  On heparin Per hospitalist ENT suggested pack stay in 5 more days.  Not ideal to keep in hospital on heparin all this time Discussed changing to pradaxa with patient and he  is ok with this D/C heparin start pradaxa per pharmacy  Antibiotics and epistaxis f/u per ENT/THR  Increase lopressor to 50 bid for rate control  Signed: Jenkins Rouge 08/29/2014, 8:54 AM

## 2014-08-30 LAB — BASIC METABOLIC PANEL
ANION GAP: 9 (ref 5–15)
BUN: 15 mg/dL (ref 6–23)
CO2: 27 mmol/L (ref 19–32)
Calcium: 8.6 mg/dL (ref 8.4–10.5)
Chloride: 103 mmol/L (ref 96–112)
Creatinine, Ser: 1.25 mg/dL (ref 0.50–1.35)
GFR, EST AFRICAN AMERICAN: 64 mL/min — AB (ref >90)
GFR, EST NON AFRICAN AMERICAN: 55 mL/min — AB (ref >90)
GLUCOSE: 115 mg/dL — AB (ref 70–99)
Potassium: 3.9 mmol/L (ref 3.5–5.1)
Sodium: 139 mmol/L (ref 135–145)

## 2014-08-30 LAB — CBC
HCT: 28.5 % — ABNORMAL LOW (ref 39.0–52.0)
HEMOGLOBIN: 9.5 g/dL — AB (ref 13.0–17.0)
MCH: 30.3 pg (ref 26.0–34.0)
MCHC: 33.3 g/dL (ref 30.0–36.0)
MCV: 90.8 fL (ref 78.0–100.0)
PLATELETS: 170 10*3/uL (ref 150–400)
RBC: 3.14 MIL/uL — ABNORMAL LOW (ref 4.22–5.81)
RDW: 14.1 % (ref 11.5–15.5)
WBC: 7.2 10*3/uL (ref 4.0–10.5)

## 2014-08-30 LAB — MAGNESIUM: Magnesium: 1.9 mg/dL (ref 1.5–2.5)

## 2014-08-30 MED ORDER — DILTIAZEM HCL ER COATED BEADS 240 MG PO CP24
240.0000 mg | ORAL_CAPSULE | Freq: Every day | ORAL | Status: DC
  Administered 2014-08-30 – 2014-09-01 (×3): 240 mg via ORAL
  Filled 2014-08-30 (×3): qty 1

## 2014-08-30 NOTE — Progress Notes (Addendum)
PROGRESS NOTE  Subjective:   [name] is a 74 year old gentleman with a history of chronic atrial fibrillation, managed on Xarelto. Also has a history of hypertension, stage III chronic kidney disease who was admitted through the emergency room with complaints of dizziness and not feeling well.  He was found to have a rapid ventricular response and we were consulted for further evaluation and management.  Hr is still fast but better.  Transitioning from IV to PO dilt.  Has had nosebleeds.    Objective:    Vital Signs:   Temp:  [97.9 F (36.6 C)-99.5 F (37.5 C)] 98.1 F (36.7 C) (01/25 0700) Pulse Rate:  [76-117] 96 (01/25 0900) Resp:  [6-22] 17 (01/25 0700) BP: (127-173)/(67-149) 127/78 mmHg (01/25 0900) SpO2:  [91 %-100 %] 99 % (01/25 0900)  Last BM Date: 08/27/14   24-hour weight change: Weight change:   Weight trends: Filed Weights   08/27/14 1144 08/27/14 1700  Weight: 200 lb (90.719 kg) 198 lb 13.7 oz (90.2 kg)    Intake/Output:  01/24 0701 - 01/25 0700 In: 2421 [P.O.:840; I.V.:1581] Out: 3625 [Urine:3625] Total I/O In: 220 [I.V.:220] Out: 600 [Urine:600]   Physical Exam: BP 127/78 mmHg  Pulse 96  Temp(Src) 98.1 F (36.7 C) (Oral)  Resp 17  Ht 6\' 2"  (1.88 m)  Wt 198 lb 13.7 oz (90.2 kg)  BMI 25.52 kg/m2  SpO2 99%  Wt Readings from Last 3 Encounters:  08/27/14 198 lb 13.7 oz (90.2 kg)  08/25/14 205 lb (92.987 kg)  07/20/14 212 lb 8 oz (96.389 kg)    General: Vital signs reviewed and noted.   Head: Normocephalic, atraumatic.  Eyes: conjunctivae/corneas clear.  EOM's intact.   Throat: normal  Neck:  normal   Lungs:    clear   Heart:   Irreg. Irreg.   Abdomen:  Soft, non-tender, non-distended    Extremities: No edema    Neurologic: A&O X3, CN II - XII are grossly intact.   Psych: Normal     Labs: BMET:  Recent Labs  08/29/14 0304 08/30/14 0241  NA 137 139  K 3.3* 3.9  CL 105 103  CO2 22 27  GLUCOSE 127* 115*  BUN 14 15    CREATININE 1.10 1.25  CALCIUM 8.0* 8.6  MG  --  1.9    Liver function tests:  Recent Labs  08/29/14 0304  AST 18  ALT 12  ALKPHOS 61  BILITOT 1.0  PROT 5.7*  ALBUMIN 3.1*   No results for input(s): LIPASE, AMYLASE in the last 72 hours.  CBC:  Recent Labs  08/29/14 0304 08/30/14 0241  WBC 9.3 7.2  HGB 9.8* 9.5*  HCT 29.0* 28.5*  MCV 90.6 90.8  PLT 154 170    Cardiac Enzymes:  Recent Labs  08/27/14 1803 08/27/14 2213 08/28/14 0519  TROPONINI <0.03 <0.03 <0.03    Coagulation Studies:  Recent Labs  08/27/14 1146  LABPROT 20.5*  INR 1.74*    Other: Invalid input(s): POCBNP No results for input(s): DDIMER in the last 72 hours. No results for input(s): HGBA1C in the last 72 hours. No results for input(s): CHOL, HDL, LDLCALC, TRIG, CHOLHDL in the last 72 hours. No results for input(s): TSH, T4TOTAL, T3FREE, THYROIDAB in the last 72 hours.  Invalid input(s): FREET3 No results for input(s): VITAMINB12, FOLATE, FERRITIN, TIBC, IRON, RETICCTPCT in the last 72 hours.   Other results:  Tele:  Atrial fib.  Rate is 90-100.   Medications:  Infusions: . sodium chloride 50 mL/hr at 08/30/14 0750  . diltiazem (CARDIZEM) infusion 5 mg/hr (08/30/14 0750)    Scheduled Medications: . cephALEXin  500 mg Oral 3 times per day  . cyanocobalamin  500 mcg Oral Daily  . dabigatran  150 mg Oral Q12H  . diltiazem  240 mg Oral Daily  . metoprolol tartrate  50 mg Oral BID  . multivitamin with minerals  1 tablet Oral Daily  . omega-3 acid ethyl esters  1 g Oral Daily  . sodium chloride  3 mL Intravenous Q12H  . vitamin C  1,000 mg Oral Daily    Assessment/ Plan:     1.   Essential hypertension:  Continue current meds.     2. Chronic anticoagulation - have started on Pradaxa     3.  Atrial fibrillation with RVR - he has chronic Afib.  Rate has increase due to GI bleed and meds ( Afrin)  His CHADS 2 VASC is 2-3 ( HTN, age, CKD)    Near syncope   CKD  (chronic kidney disease), stage III   Symptomatic anemia   Disposition: possible DC to home today ,  Further plans per Int. Med.  Will sign off.  Call for questions.   Length of Stay: 3  Thayer Headings, Brooke Bonito., MD, [facility] 08/30/2014, 10:32 AM Office [phone] Pager [phone]

## 2014-08-30 NOTE — Progress Notes (Signed)
[place] TEAM 1 - Stepdown/ICU TEAM Progress Note  [name] YYQ:[id number] DOB: 1941/07/19 DOA: 08/27/2014 PCP: Annye Asa, MD  Admit HPI / Brief Narrative: 74 yo male w/ a history of chronic atrial fibrillation on Xarelto, chronic kidney disease stage II-III, and hypertension who was seen in the ER 1/20 for difficult to control epistaxis. Was treated with Afrin and a Rhino Rocket and according to the patient has an outpatient ENT appointment scheduled for Monday 1/25. The patient apparently had extensive epistaxis but hemoglobin was 14.3. He returned to the ER 1/22 with complaints of weakness and near syncope. Denied having chest pain or shortness of breath prior to onset of these symptoms.   Upon arrival to the ER patient was found to be in atrial fibrillation with ventricular response in the 130s. He had white count of 15,700 and his hemoglobin had dropped to 12.0. He was also somewhat orthostatic with blood pressures decreasing from 170/94 to 144/96. His BUN on 1/20 was 43 but had decreased to 19. He was started on IV Cardizem drip with subsequent conversion to sinus rhythm. He told me that he continue to take his usual Xarelto since the recent ER visit and has had no further nasal bleeding. He was told to expect either melena or dark stools which he has had since the nosebleed.  HPI/Subjective: No new complaints today.  Tolerating his rhino rocket well.  Denies cp, n/v, or abdom pain.     Assessment/Plan:  Epistaxis  Cards has transitioned from heparin to Pradaxa after discussing this w/ the pt - I agree w/ this decision - discussed nasal tampon w/ on call ENT MD who suggested it should remain in place for 5 days total at least, and also suggested empiric keflex tx - spoke w/ Dr. Simeon Craft via phone this morning who advised that packing removal is best accomplished in his office as he has access to cautery there in his procedure sweet - to do so as an inpatient would require a trip to  the OR - explained this to the pt who understood - pt is to call Dr. Theressa Millard office to reschedule a f/u appointment for rhino rocket removal - Dr. Simeon Craft advised that it was safe to leave packing in place w/ pt covered w/ abx - no clinical signs at this time of infection or ongoing bleeding - no signif pain in nose   Chronic afib on prior xarelto w/ acute RVR Cardiology following - patient has used Coumadin in the past but desires to remain on NOAC - rate now reasonably controlled - Cards has transitioned to Pradaxa - I will transition him to oral cardizem today, and plan for d/c home in the morning if his HR remains controlled - asked the CM to investigate his cost for Pradaxa   Near syncope - symptomatic anemia due to acute blood loss appeared clinically dehydrated at presentation - likely mediated by acute blood loss + RVR - Hgb has dropped ~4 g since 1/20 but has stabilized at ~9.5 - ambulate w/ assist   HTN BP currently reasonably controlled  Hypokalemia Replace and follow - goal is K+ 4.0  CKD stage 3 Renal function stable at baseline  Code Status: FULL Family Communication: no family present at time of exam Disposition Plan: SDU as pt on cardizem gtt - plan for d/c home in AM if HR controlled    Consultants: [facility] Cards  Procedures: none  Antibiotics: Keflex 1/23 >  DVT prophylaxis: IV heparin > pradaxa  Objective: Blood pressure 134/90, pulse 79, temperature 97.7 F (36.5 C), temperature source Oral, resp. rate 18, height 6\' 2"  (1.88 m), weight 90.2 kg (198 lb 13.7 oz), SpO2 99 %.  Intake/Output Summary (Last 24 hours) at 08/30/14 1808 Last data filed at 08/30/14 1500  Gross per 24 hour  Intake 1051.33 ml  Output   2450 ml  Net -1398.67 ml   Exam: General: No acute respiratory distress HEENT:  R nasal tampon pack in place - no d/c, epistaxis, foul odor Lungs: Clear to auscultation bilaterally without wheezes or crackles Cardiovascular: irreg irreg - rate 100 BPM -  no appreciable M Abdomen: Nontender, nondistended, soft, bowel sounds positive, no rebound, no ascites, no appreciable mass Extremities: No significant cyanosis, clubbing, or edema bilateral lower extremities  Data Reviewed: Basic Metabolic Panel:  Recent Labs Lab 08/25/14 0056 08/27/14 1146 08/28/14 0519 08/29/14 0304 08/30/14 0241  NA 144 137 140 137 139  K 4.6 3.4* 3.8 3.3* 3.9  CL 109 102 106 105 103  CO2  --  25 26 22 27   GLUCOSE 133* 160* 110* 127* 115*  BUN 43* 19 16 14 15   CREATININE 1.60* 1.61* 1.30 1.10 1.25  CALCIUM  --  9.3 8.4 8.0* 8.6  MG  --   --   --   --  1.9    Liver Function Tests:  Recent Labs Lab 08/29/14 0304  AST 18  ALT 12  ALKPHOS 61  BILITOT 1.0  PROT 5.7*  ALBUMIN 3.1*    Coags:  Recent Labs Lab 08/27/14 1146  INR 1.74*   CBC:  Recent Labs Lab 08/27/14 1803 08/28/14 0519 08/28/14 1827 08/29/14 0304 08/30/14 0241  WBC 12.1* 9.8 9.3 9.3 7.2  HGB 11.2* 10.7* 11.3* 9.8* 9.5*  HCT 33.2* 32.1* 34.0* 29.0* 28.5*  MCV 90.2 91.5 91.4 90.6 90.8  PLT 147* 137* 171 154 170   Cardiac Enzymes:  Recent Labs Lab 08/27/14 1803 08/27/14 2213 08/28/14 0519  TROPONINI <0.03 <0.03 <0.03    Recent Results (from the past 240 hour(s))  MRSA PCR Screening     Status: None   Collection Time: 08/27/14  4:57 PM  Result Value Ref Range Status   MRSA by PCR NEGATIVE NEGATIVE Final    Comment:        The GeneXpert MRSA Assay (FDA approved for NASAL specimens only), is one component of a comprehensive MRSA colonization surveillance program. It is not intended to diagnose MRSA infection nor to guide or monitor treatment for MRSA infections.      Studies:  Recent x-ray studies have been reviewed in detail by the Attending Physician  Scheduled Meds:  Scheduled Meds: . cephALEXin  500 mg Oral 3 times per day  . cyanocobalamin  500 mcg Oral Daily  . dabigatran  150 mg Oral Q12H  . diltiazem  240 mg Oral Daily  . metoprolol  tartrate  50 mg Oral BID  . multivitamin with minerals  1 tablet Oral Daily  . omega-3 acid ethyl esters  1 g Oral Daily  . sodium chloride  3 mL Intravenous Q12H  . vitamin C  1,000 mg Oral Daily    Time spent on care of this patient: 35 mins   MCCLUNG,JEFFREY T , MD   [facility] Office  [phone] Pager - Text Page per Shea Evans as per below:  On-Call/Text Page:      [email or web address]      password [email or web address]  If 7PM-7AM, please contact night-coverage [email or web address] Password [facility] 08/30/2014,  6:08 PM   LOS: 3 days

## 2014-08-30 NOTE — Care Management Note (Addendum)
    Page 1 of 1   09/01/2014     11:11:16 AM CARE MANAGEMENT NOTE 09/01/2014  Patient:  [name], [name]   Account Number:  [id number]  Date Initiated:  08/30/2014  Documentation initiated by:  Elissa Hefty  Subjective/Objective Assessment:   adm w htn     Action/Plan:   lives w wife, pcp dr Alver Fisher   Anticipated DC Date:  09/01/2014   Anticipated DC Plan:  Center  CM consult  Medication Assistance      Choice offered to / List presented to:             Status of service:  Completed, signed off Medicare Important Message given?  YES (If response is "NO", the following Medicare IM given date fields will be blank) Date Medicare IM given:  08/30/2014 Medicare IM given by:  Elissa Hefty Date Additional Medicare IM given:   Additional Medicare IM given by:    Discharge Disposition:  HOME/SELF CARE  Per UR Regulation:  Reviewed for med. necessity/level of care/duration of stay  If discussed at [place] of Stay Meetings, dates discussed:    Comments:  09/01/14 [place], BSN [phone] patient is for dc today, has pradaxa card and can afford co pay per patient.  1/25 1326 debbie dowell [email or web address] gave pt 30day free pradaxa card. has 80.00 per month copay for pradaxa. no prior auth req.

## 2014-08-31 DIAGNOSIS — Z7901 Long term (current) use of anticoagulants: Secondary | ICD-10-CM

## 2014-08-31 DIAGNOSIS — E876 Hypokalemia: Secondary | ICD-10-CM

## 2014-08-31 LAB — CBC
HEMATOCRIT: 32.4 % — AB (ref 39.0–52.0)
Hemoglobin: 10.8 g/dL — ABNORMAL LOW (ref 13.0–17.0)
MCH: 30.4 pg (ref 26.0–34.0)
MCHC: 33.3 g/dL (ref 30.0–36.0)
MCV: 91.3 fL (ref 78.0–100.0)
PLATELETS: 206 10*3/uL (ref 150–400)
RBC: 3.55 MIL/uL — AB (ref 4.22–5.81)
RDW: 13.8 % (ref 11.5–15.5)
WBC: 6.8 10*3/uL (ref 4.0–10.5)

## 2014-08-31 MED ORDER — METOPROLOL TARTRATE 100 MG PO TABS
100.0000 mg | ORAL_TABLET | Freq: Two times a day (BID) | ORAL | Status: DC
  Administered 2014-08-31 – 2014-09-01 (×2): 100 mg via ORAL
  Filled 2014-08-31 (×3): qty 1

## 2014-08-31 NOTE — Progress Notes (Signed)
[place] TEAM 1 - Stepdown/ICU TEAM Progress Note  ANTAEUS KAREL ZOX:[id number] DOB: 20-May-1941 DOA: 08/27/2014 PCP: Annye Asa, MD  Admit HPI / Brief Narrative: 74 yo WM PMHx chronic atrial fibrillation on Xarelto, chronic kidney disease stage II-III, and hypertension who was seen in the ER 1/20 for difficult to control epistaxis. Was treated with Afrin and a Rhino Rocket and according to the patient has an outpatient ENT appointment scheduled for Monday 1/25. The patient apparently had extensive epistaxis but hemoglobin was 14.3. He returned to the ER 1/22 with complaints of weakness and near syncope. Denied having chest pain or shortness of breath prior to onset of these symptoms.   Upon arrival to the ER patient was found to be in atrial fibrillation with ventricular response in the 130s. He had white count of 15,700 and his hemoglobin had dropped to 12.0. He was also somewhat orthostatic with blood pressures decreasing from 170/94 to 144/96. His BUN on 1/20 was 43 but had decreased to 19. He was started on IV Cardizem drip with subsequent conversion to sinus rhythm. He told me that he continued to take his usual Xarelto since the recent ER visit and has had no further nasal bleeding. He was told to expect either melena or dark stools which he has had since the nosebleed.  HPI/Subjective: 1/26 A/O 4 , continues to have the rhino rocket in place which was placed on 1/20. Cardiology still adjusting medication.  Assessment/Plan: Epistaxis  -Continue Pradaxa 150 mg BID per cardiology  -Will continue Rhino Rocket in place until discharge at which time will be evaluated by ENT for removal (per Epic Dr. Simeon Craft ENT recommended leaving in place minimum 5 days). -Continue Keflex per ENT recommendation - pt is to call Dr. Theressa Millard office to reschedule a f/u appointment for rhino rocket removal   Chronic afib on prior xarelto w/ acute RVR -Patient continues in atrial fibrillation  -Continue  Pradaxa 150 mg BID per cardiology -Continue diltiazem CD 240 mg daily -Increase metoprolol 100 mg BID -If stable will discharge in a.m.  Near syncope - symptomatic anemia due to acute blood loss -Orthostatic vitals prior to discharge  -Hemoglobin has been stable, continue stable overnight DC in a.m.  HTN -BP controlled on current medication however for a 74 year old may be hypotensive.  -Orthostatic vitals in the a.m.   Hypokalemia -Potassium goal > 4   Hypomagnesemia -Magnesium goal> 2  CKD stage 3 -Renal function stable at baseline   Code Status: FULL Family Communication: no family present at time of exam Disposition Plan: Discharge in a.m. if stable    Consultants: Dr. Mechele Collin (PCCM)   Procedure/Significant Events: None   Culture NA  Antibiotics: Keflex 1/23>>  DVT prophylaxis: IV heparin > pradaxa    Devices    LINES / TUBES:      Continuous Infusions:   Objective: VITAL SIGNS: Temp: 97.8 F (36.6 C) (01/26 0800) Temp Source: Oral (01/26 0800) BP: 134/91 mmHg (01/26 0732) Pulse Rate: 99 (01/26 0732) SPO2; FIO2:   Intake/Output Summary (Last 24 hours) at 08/31/14 0949 Last data filed at 08/31/14 0840  Gross per 24 hour  Intake 683.33 ml  Output   2275 ml  Net -1591.67 ml     Exam: General: A/O x4, NAD, No acute respiratory distress Lungs: Clear to auscultation bilaterally without wheezes or crackles Cardiovascular: Regular rate and rhythm without murmur gallop or rub normal S1 and S2 Abdomen: Nontender, nondistended, soft, bowel sounds positive, no rebound, no ascites,  no appreciable mass Extremities: No significant cyanosis, clubbing, or edema bilateral lower extremities  Data Reviewed: Basic Metabolic Panel:  Recent Labs Lab 08/25/14 0056 08/27/14 1146 08/28/14 0519 08/29/14 0304 08/30/14 0241  NA 144 137 140 137 139  K 4.6 3.4* 3.8 3.3* 3.9  CL 109 102 106 105 103  CO2  --  25 26 22 27   GLUCOSE 133* 160*  110* 127* 115*  BUN 43* 19 16 14 15   CREATININE 1.60* 1.61* 1.30 1.10 1.25  CALCIUM  --  9.3 8.4 8.0* 8.6  MG  --   --   --   --  1.9   Liver Function Tests:  Recent Labs Lab 08/29/14 0304  AST 18  ALT 12  ALKPHOS 61  BILITOT 1.0  PROT 5.7*  ALBUMIN 3.1*   No results for input(s): LIPASE, AMYLASE in the last 168 hours. No results for input(s): AMMONIA in the last 168 hours. CBC:  Recent Labs Lab 08/28/14 0519 08/28/14 1827 08/29/14 0304 08/30/14 0241 08/31/14 0329  WBC 9.8 9.3 9.3 7.2 6.8  HGB 10.7* 11.3* 9.8* 9.5* 10.8*  HCT 32.1* 34.0* 29.0* 28.5* 32.4*  MCV 91.5 91.4 90.6 90.8 91.3  PLT 137* 171 154 170 206   Cardiac Enzymes:  Recent Labs Lab 08/27/14 1803 08/27/14 2213 08/28/14 0519  TROPONINI <0.03 <0.03 <0.03   BNP (last 3 results) No results for input(s): PROBNP in the last 8760 hours. CBG: No results for input(s): GLUCAP in the last 168 hours.  Recent Results (from the past 240 hour(s))  MRSA PCR Screening     Status: None   Collection Time: 08/27/14  4:57 PM  Result Value Ref Range Status   MRSA by PCR NEGATIVE NEGATIVE Final    Comment:        The GeneXpert MRSA Assay (FDA approved for NASAL specimens only), is one component of a comprehensive MRSA colonization surveillance program. It is not intended to diagnose MRSA infection nor to guide or monitor treatment for MRSA infections.      Studies:  Recent x-ray studies have been reviewed in detail by the Attending Physician  Scheduled Meds:  Scheduled Meds: . cephALEXin  500 mg Oral 3 times per day  . cyanocobalamin  500 mcg Oral Daily  . dabigatran  150 mg Oral Q12H  . diltiazem  240 mg Oral Daily  . metoprolol tartrate  50 mg Oral BID  . multivitamin with minerals  1 tablet Oral Daily  . omega-3 acid ethyl esters  1 g Oral Daily  . sodium chloride  3 mL Intravenous Q12H  . vitamin C  1,000 mg Oral Daily    Time spent on care of this patient: 40 mins   Allie Bossier ,  MD   [facility] Office  [phone] Pager - [phone]  On-Call/Text Page:      [email or web address]      password [email or web address]  If 7PM-7AM, please contact night-coverage [email or web address] Password [email or web address] 08/31/2014, 9:49 AM   LOS: 4 days   Care during the described time interval was provided by me . I have reviewed this patient's available data, including medical history, events of note, physical examination, radiology studies and test results as part of my evaluation  Dia Crawford, MD [phone] Pager

## 2014-08-31 NOTE — Progress Notes (Signed)
PROGRESS NOTE  Subjective:   [name] is a 74 year old gentleman with a history of chronic atrial fibrillation, managed on Xarelto. Also has a history of hypertension, stage III chronic kidney disease who was admitted through the emergency room with complaints of dizziness and not feeling well.  He was found to have a rapid ventricular response and we were consulted for further evaluation and management.  Hr is still fast but better.  Transitioning from IV to PO dilt.  Has had nosebleeds.    Objective:    Vital Signs:   Temp:  [97.3 F (36.3 C)-98.6 F (37 C)] 97.8 F (36.6 C) (01/26 0800) Pulse Rate:  [79-103] 103 (01/26 1028) Resp:  [18] 18 (01/25 1135) BP: (134-153)/(90-100) 150/94 mmHg (01/26 1028) SpO2:  [98 %-100 %] 98 % (01/26 0732)  Last BM Date: 08/27/14   24-hour weight change: Weight change:   Weight trends: Filed Weights   08/27/14 1144 08/27/14 1700  Weight: 200 lb (90.719 kg) 198 lb 13.7 oz (90.2 kg)    Intake/Output:  01/25 0701 - 01/26 0700 In: 368.3 [I.V.:368.3] Out: 2875 [Urine:2875] Total I/O In: 480 [P.O.:480] Out: -    Physical Exam: BP 150/94 mmHg  Pulse 103  Temp(Src) 97.8 F (36.6 C) (Oral)  Resp 18  Ht 6\' 2"  (1.88 m)  Wt 198 lb 13.7 oz (90.2 kg)  BMI 25.52 kg/m2  SpO2 98%  Wt Readings from Last 3 Encounters:  08/27/14 198 lb 13.7 oz (90.2 kg)  08/25/14 205 lb (92.987 kg)  07/20/14 212 lb 8 oz (96.389 kg)    General: Vital signs reviewed and noted.   Head: Normocephalic, atraumatic.  Eyes: conjunctivae/corneas clear.  EOM's intact.   Throat: normal  Neck:  normal   Lungs:    clear   Heart:   Irreg. Irreg.   Abdomen:  Soft, non-tender, non-distended    Extremities: No edema    Neurologic: A&O X3, CN II - XII are grossly intact.   Psych: Normal     Labs: BMET:  Recent Labs  08/29/14 0304 08/30/14 0241  NA 137 139  K 3.3* 3.9  CL 105 103  CO2 22 27  GLUCOSE 127* 115*  BUN 14 15  CREATININE 1.10 1.25    CALCIUM 8.0* 8.6  MG  --  1.9    Liver function tests:  Recent Labs  08/29/14 0304  AST 18  ALT 12  ALKPHOS 61  BILITOT 1.0  PROT 5.7*  ALBUMIN 3.1*   No results for input(s): LIPASE, AMYLASE in the last 72 hours.  CBC:  Recent Labs  08/30/14 0241 08/31/14 0329  WBC 7.2 6.8  HGB 9.5* 10.8*  HCT 28.5* 32.4*  MCV 90.8 91.3  PLT 170 206    Cardiac Enzymes: No results for input(s): CKTOTAL, CKMB, TROPONINI in the last 72 hours.  Coagulation Studies: No results for input(s): LABPROT, INR in the last 72 hours.  Other: Invalid input(s): POCBNP No results for input(s): DDIMER in the last 72 hours. No results for input(s): HGBA1C in the last 72 hours. No results for input(s): CHOL, HDL, LDLCALC, TRIG, CHOLHDL in the last 72 hours. No results for input(s): TSH, T4TOTAL, T3FREE, THYROIDAB in the last 72 hours.  Invalid input(s): FREET3 No results for input(s): VITAMINB12, FOLATE, FERRITIN, TIBC, IRON, RETICCTPCT in the last 72 hours.   Other results:  Tele:  Atrial fib.  Rate is 90-100.   Medications:    Infusions:    Scheduled Medications: .  cephALEXin  500 mg Oral 3 times per day  . cyanocobalamin  500 mcg Oral Daily  . dabigatran  150 mg Oral Q12H  . diltiazem  240 mg Oral Daily  . metoprolol tartrate  50 mg Oral BID  . multivitamin with minerals  1 tablet Oral Daily  . omega-3 acid ethyl esters  1 g Oral Daily  . sodium chloride  3 mL Intravenous Q12H  . vitamin C  1,000 mg Oral Daily    Assessment/ Plan:     1.   Essential hypertension:  Continue current meds.     2. Chronic anticoagulation - have started on Pradaxa     3.  Atrial fibrillation with RVR - he has chronic Afib.  Rate has increase due to GI bleed and meds ( Afrin) .  His HR is at the upper limits of normal today.   Will increase his metoprolol to 100 bid.  His CHADS 2 VASC is 2-3 ( HTN, age, CKD)     Near syncope   CKD (chronic kidney disease), stage III   Symptomatic  anemia   Disposition: possible DC to home today ,   Follow up with Dr. [place]ique as OP.  Further plans per Int. Med.  Will sign off.  Call for questions.   Length of Stay: 4  Thayer Headings, Brooke Bonito., MD, [facility] 08/31/2014, 10:38 AM Office [phone] Pager [phone]

## 2014-09-01 LAB — CBC
HCT: 32.4 % — ABNORMAL LOW (ref 39.0–52.0)
HEMOGLOBIN: 10.8 g/dL — AB (ref 13.0–17.0)
MCH: 30.4 pg (ref 26.0–34.0)
MCHC: 33.3 g/dL (ref 30.0–36.0)
MCV: 91.3 fL (ref 78.0–100.0)
Platelets: 240 10*3/uL (ref 150–400)
RBC: 3.55 MIL/uL — ABNORMAL LOW (ref 4.22–5.81)
RDW: 13.8 % (ref 11.5–15.5)
WBC: 6.2 10*3/uL (ref 4.0–10.5)

## 2014-09-01 MED ORDER — CEPHALEXIN 500 MG PO CAPS: 500.0000 mg | ORAL_CAPSULE | Freq: Three times a day (TID) | ORAL | Status: DC

## 2014-09-01 MED ORDER — DILTIAZEM HCL ER 240 MG PO CP24: 240.0000 mg | ORAL_CAPSULE | Freq: Every day | ORAL | Status: DC

## 2014-09-01 MED ORDER — DABIGATRAN ETEXILATE MESYLATE 150 MG PO CAPS: 150.0000 mg | ORAL_CAPSULE | Freq: Two times a day (BID) | ORAL | Status: DC

## 2014-09-01 MED ORDER — METOPROLOL TARTRATE 100 MG PO TABS: 100.0000 mg | ORAL_TABLET | Freq: Two times a day (BID) | ORAL | Status: DC

## 2014-09-01 NOTE — Discharge Summary (Addendum)
Physician Discharge Summary  [name]:[id number] DOB: 10-Jan-1941 DOA: 08/27/2014  PCP: Annye Asa, MD  Admit date: 08/27/2014 Discharge date: 09/01/2014  Time spent: >35 minutes  Recommendations for Outpatient Follow-up:  1. Follow up with  Dr. Simeon Craft as soon as possible for removal of Rhino rocket 2. Follow up with Dr.  Martinique, Cardiologist, for after hospital visit 3. Follow up with PCP for after hospital visit  4. Discharge home  Discharge Diagnoses:  Chronic atrial fibrillation w/ acute RVR  Epistaxis on anticoag  Near syncope Hypertension                          Hypokalemia CKD stage III Erectile Dysfunction  Discharge Condition: Stable  Diet recommendation: Heart Healthy diet  Filed Weights   08/27/14 1144 08/27/14 1700 08/31/14 1900  Weight: 90.719 kg (200 lb) 90.2 kg (198 lb 13.7 oz) 90.2 kg (198 lb 13.7 oz)    History of present illness:  74 yo male w/ a history of chronic atrial fibrillation on Xarelto, chronic kidney disease stage II-III, and hypertension who was seen in the ER 1/20 for difficult to control epistaxis. Was treated with Afrin and a Rhino Rocket and according to the patient has an outpatient ENT appointment scheduled for Monday 1/25. The patient apparently had extensive epistaxis but hemoglobin was 14.3. He returned to the ER 1/22 with complaints of weakness and near syncope. Denied having chest pain or shortness of breath prior to onset of these symptoms.   Upon arrival to the ER patient was found to be in atrial fibrillation with ventricular response in the 130s. He had white count of 15,700 and his hemoglobin had dropped to 12.0. He was also somewhat orthostatic with blood pressures decreasing from 170/94 to 144/96. His BUN on 1/20 was 43 but had decreased to 19. He was started on IV Cardizem drip with subsequent conversion to sinus rhythm. He told me that he continue to take his usual Xarelto since the recent ER visit and has had no further  nasal bleeding. He was told to expect either melena or dark stools which he has had since the nosebleed.  Hospital Course:   Chronic atrial fibrillation Patient was followed by Cardiology, and was placed on IV Cardiazem, IV heparin, Metoprolol, and Xarelto. Metoprolol was then increased and IV Cardiazem was transitioned to oral Cardiazem. Xarelto was stopped and pt was placed on Pradaxa, which has an available antidote, since pt is considered high risk for recurrent bleed.  Per Case Manager, pt is provided with discount cards for Pradaxa and is able to afford copay -On discharge, STOP taking Xarelto -On discharge, continue Metoprolol 100mg  BID; Diltiazem 240mg  daily; and Pradaxa 150mg  q 12 hours.  -Follow up with Cardiologist for after hospital visit  Epistaxis No recurrent epistaxis during hospitalization,  Rhino Rocket in right nare since 08/25/14.  Case discussed with ENT who suggested that [place] should be removed in the office soon after discharge.  -Follow-up with Dr. Simeon Craft as soon as possible for [facility] removal in the office - pt is aware of plan and will call the office to schedule this appointment asap -Continue Keflex 500mg  TID until packing removed  Near syncope -No recurrence of syncope during stay. Patient appeared clinically dehydrated at presentation.  Syncope was felt to be due to [facility], acute blood loss, and RVR.  Patient was maintained on IV fluids, with resolution of syncope at discharge.  Hypertension -Blood pressure remained  controlled. -On discharge, continue Benazepril 20mg  BID, Diltiazem, Hydaralazine 25mg  TID         -FU with PCP for after hospital visit for adjustment of medication as needed                             Hypokalemia Potassium stable at discharge. Repleted with oral potassium -Continue potassium 20mg  daily  CK D stage III Creatinine stable at baseline on discharge. -FU with PCP for creatinine recheck   Erectile Dysfunction -Continue  Sildenafil 100mg  daily PRN   Procedures:  None  Consultations:  ENT per phone x2  Cardiology - [facility]  Discharge Exam: Filed Vitals:   09/01/14 1004  BP: 127/86  Pulse: 84  Temp:   Resp:      Exam General: Alert male in NAD. Rhino Rocket in R nare w/o purulent d/c or epsitaxis  Eyes: Anicteric Cardiovascular: Regular rate.  No murmurs, rubs, or gallops. Respiratory: Clear to auscultate bilaterally.  No rhonchi or crepitations. Abdomen: Soft nontender bowel sounds present. No guarding or rigidity.  Musculoskeletal: No edema. Neurologic: Alert awake oriented to time place and person.      Medication List    STOP taking these medications        rivaroxaban 20 MG Tabs tablet  Commonly known as:  XARELTO      TAKE these medications        benazepril 20 MG tablet  Commonly known as:  LOTENSIN  Take 1 tablet (20 mg total) by mouth 2 (two) times daily.     cephALEXin 500 MG capsule  Commonly known as:  KEFLEX  Take 1 capsule (500 mg total) by mouth every 8 (eight) hours.     dabigatran 150 MG Caps capsule  Commonly known as:  PRADAXA  Take 1 capsule (150 mg total) by mouth every 12 (twelve) hours.     diltiazem 240 MG 24 hr capsule  Commonly known as:  DILACOR XR  Take 1 capsule (240 mg total) by mouth daily.     Fish Oil 1000 MG Caps  Take 1 capsule by mouth daily.     Garlic 3810 MG Caps  Take 1 capsule by mouth daily.     hydrALAZINE 25 MG tablet  Commonly known as:  APRESOLINE  Take 1 tablet (25 mg total) by mouth 3 (three) times daily.     metoprolol 100 MG tablet  Commonly known as:  LOPRESSOR  Take 1 tablet (100 mg total) by mouth 2 (two) times daily.     multivitamin tablet  Take 1 tablet by mouth daily.     potassium chloride SA 20 MEQ tablet  Commonly known as:  KLOR-CON M20  Take 1 tablet (20 mEq total) by mouth daily.     Propylene Glycol 0.6 % Soln  Apply 2-3 drops to eye daily as needed (for eyes).     Saw Palmetto Berries 540 MG  Caps  Take 1,080 mg by mouth daily.     VIAGRA 100 MG tablet  Generic drug:  sildenafil  Take 100 mg by mouth daily as needed for erectile dysfunction.     vitamin B-12 500 MCG tablet  Commonly known as:  CYANOCOBALAMIN  Take 500 mcg by mouth daily.     vitamin C 1000 MG tablet  Take 1,000 mg by mouth daily.     Vitamin D-3 5000 UNITS Tabs  Take 1 tablet by mouth daily.  Allergies  Allergen Reactions  . Cipro Iv [Ciprofloxacin]   . Hctz [Hydrochlorothiazide] Photosensitivity    Significant Diagnostic Studies: Dg Chest 2 View  08/27/2014   CLINICAL DATA:  Weakness.  No chest complaints.  EXAM: CHEST  2 VIEW  COMPARISON:  05/19/2006  FINDINGS: Stable cardiac and mediastinal contours, upper limits of normal to mildly enlarged. No large consolidative pulmonary opacities. No pleural effusion or pneumothorax. Regional skeleton is unremarkable.  IMPRESSION: No acute cardiopulmonary process.   Electronically Signed   By: Lovey Newcomer M.D.   On: 08/27/2014 14:04    Microbiology: Recent Results (from the past 240 hour(s))  MRSA PCR Screening     Status: None   Collection Time: 08/27/14  4:57 PM  Result Value Ref Range Status   MRSA by PCR NEGATIVE NEGATIVE Final    Comment:        The GeneXpert MRSA Assay (FDA approved for NASAL specimens only), is one component of a comprehensive MRSA colonization surveillance program. It is not intended to diagnose MRSA infection nor to guide or monitor treatment for MRSA infections.      Labs: Basic Metabolic Panel:  Recent Labs Lab 08/27/14 1146 08/28/14 0519 08/29/14 0304 08/30/14 0241  NA 137 140 137 139  K 3.4* 3.8 3.3* 3.9  CL 102 106 105 103  CO2 25 26 22 27   GLUCOSE 160* 110* 127* 115*  BUN 19 16 14 15   CREATININE 1.61* 1.30 1.10 1.25  CALCIUM 9.3 8.4 8.0* 8.6  MG  --   --   --  1.9   Liver Function Tests:  Recent Labs Lab 08/29/14 0304  AST 18  ALT 12  ALKPHOS 61  BILITOT 1.0  PROT 5.7*  ALBUMIN 3.1*    CBC:  Recent Labs Lab 08/28/14 1827 08/29/14 0304 08/30/14 0241 08/31/14 0329 09/01/14 0639  WBC 9.3 9.3 7.2 6.8 6.2  HGB 11.3* 9.8* 9.5* 10.8* 10.8*  HCT 34.0* 29.0* 28.5* 32.4* 32.4*  MCV 91.4 90.6 90.8 91.3 91.3  PLT 171 154 170 206 240   Cardiac Enzymes:  Recent Labs Lab 08/27/14 1803 08/27/14 2213 08/28/14 0519  TROPONINI <0.03 <0.03 <0.03   Signed:  Aynsley Fleet T PA-C  [facility] 09/01/2014, 1:00 PM  I have personally examined this patient and reviewed the entire database. I have reviewed the above note, made any necessary editorial changes, and agree with its content.  Cherene Altes, MD [facility]

## 2014-09-01 NOTE — Progress Notes (Signed)
Patient discharge teaching given, including activity, diet, follow-up appoints, and medications. Patient verbalized understanding of all discharge instructions. IV access was d/c'd. Vitals are stable. Skin is intact except as charted in most recent assessments. Pt to be escorted out by NT, to be driven home by family. 

## 2014-09-03 ENCOUNTER — Telehealth: Payer: Self-pay

## 2014-09-03 NOTE — Telephone Encounter (Signed)
Admit date: 08/27/2014 Discharge date: 09/01/2014  Reason for admission:  Chronic atrial fibrillation w/ acute RVR   Left a message for call back.

## 2014-09-06 NOTE — Telephone Encounter (Signed)
Admit date: 08/27/2014  Discharge date: 09/01/2014  Reason for admission: Epistaxis; Chronic atrial fibrillation w/ acute RVR  Recommendations for Outpatient Follow-up:  1. Follow up with Dr. Simeon Craft as soon as possible for removal of Rhino rocket 2. Follow up with Dr. Martinique, Cardiologist, for after hospital visit 3. Follow up with PCP for after hospital visit  4. Discharge home  Transition Care Management Follow-up Telephone Call  How have you been since you were released from the hospital? Pt states he's doing much better.  No nosebleeds since discharge.  Denies dizziness, excessive fatigue, or weakness.  States he does feel slightly weak, but attributes it to laying in hospital bed x 1 week.  Rhino rocket was removed on 09/02/14 by ENT per patient.  He has started Pradaxa and is tolerating medication well.  Has an appointment with Cardiologist on 09/24/14.     Do you understand why you were in the hospital? yes   Do you understand the discharge instructions? yes  Items Reviewed:  Medications reviewed: yes  Allergies reviewed: yes  Dietary changes reviewed: n/a  Referrals reviewed: yes   Functional Questionnaire:   Activities of Daily Living (ADLs):   He states they are independent in the following: ambulation, bathing and hygiene, feeding, continence, grooming, toileting and dressing States they require assistance with the following: none   Any transportation issues/concerns?: no   Any patient concerns? no   Confirmed importance and date/time of follow-up visits scheduled: yes   Confirmed with patient if condition begins to worsen call PCP or go to the ER: yes   Hospital follow up appointment scheduled with Dr. Birdie Riddle for Thursday, September 09, 2014 at 11:00 am.

## 2014-09-09 ENCOUNTER — Telehealth: Payer: Self-pay | Admitting: *Deleted

## 2014-09-09 ENCOUNTER — Ambulatory Visit: Payer: Medicare Other | Admitting: Family Medicine

## 2014-09-09 NOTE — Telephone Encounter (Signed)
Pt called and cancelled at 8:23am for appointment 09/09/14 for 11:00am "hospital follow up/ss - spoke with grandson- sister broke leg and may not be able to come tomorrow- he will have PT call back before end of business. MH".    Per Katherine Roan, do not charge no show fee. Please confirm.

## 2014-09-09 NOTE — Telephone Encounter (Signed)
No fee

## 2014-09-23 ENCOUNTER — Ambulatory Visit (INDEPENDENT_AMBULATORY_CARE_PROVIDER_SITE_OTHER): Payer: Medicare Other | Admitting: Family Medicine

## 2014-09-23 ENCOUNTER — Encounter: Payer: Self-pay | Admitting: Family Medicine

## 2014-09-23 VITALS — BP 140/82 | HR 74 | Temp 97.9°F | Resp 16 | Wt 217.2 lb

## 2014-09-23 DIAGNOSIS — I4891 Unspecified atrial fibrillation: Secondary | ICD-10-CM

## 2014-09-23 DIAGNOSIS — Z7901 Long term (current) use of anticoagulants: Secondary | ICD-10-CM

## 2014-09-23 DIAGNOSIS — E876 Hypokalemia: Secondary | ICD-10-CM

## 2014-09-23 LAB — CBC WITH DIFFERENTIAL/PLATELET
Basophils Absolute: 0 10*3/uL (ref 0.0–0.1)
Basophils Relative: 0 % (ref 0–1)
EOS ABS: 0.1 10*3/uL (ref 0.0–0.7)
EOS PCT: 1 % (ref 0–5)
HEMATOCRIT: 34.2 % — AB (ref 39.0–52.0)
HEMOGLOBIN: 11.2 g/dL — AB (ref 13.0–17.0)
LYMPHS PCT: 17 % (ref 12–46)
Lymphs Abs: 1.1 10*3/uL (ref 0.7–4.0)
MCH: 30.4 pg (ref 26.0–34.0)
MCHC: 32.7 g/dL (ref 30.0–36.0)
MCV: 92.9 fL (ref 78.0–100.0)
MPV: 11.2 fL (ref 8.6–12.4)
Monocytes Absolute: 0.6 10*3/uL (ref 0.1–1.0)
Monocytes Relative: 10 % (ref 3–12)
Neutro Abs: 4.5 10*3/uL (ref 1.7–7.7)
Neutrophils Relative %: 72 % (ref 43–77)
Platelets: 193 10*3/uL (ref 150–400)
RBC: 3.68 MIL/uL — AB (ref 4.22–5.81)
RDW: 14.5 % (ref 11.5–15.5)
WBC: 6.3 10*3/uL (ref 4.0–10.5)

## 2014-09-23 LAB — BASIC METABOLIC PANEL
BUN: 15 mg/dL (ref 6–23)
CALCIUM: 8.9 mg/dL (ref 8.4–10.5)
CO2: 26 meq/L (ref 19–32)
Chloride: 107 mEq/L (ref 96–112)
Creat: 1.1 mg/dL (ref 0.50–1.35)
GLUCOSE: 91 mg/dL (ref 70–99)
Potassium: 4.4 mEq/L (ref 3.5–5.3)
SODIUM: 142 meq/L (ref 135–145)

## 2014-09-23 NOTE — Progress Notes (Signed)
   Subjective:    Patient ID: [name], male    DOB: [birth year], 74 y.o.   MRN: [id number]  [facility] f/u- pt was admitted 1/22 w/ weakness, near syncope and Afib w/ RVR.  Pt had severe epistaxis on Xarelto.  Pt was transitioned from IV to oral Cardizem and metoprolol was increased.  Xarelto was switched to Pradaxa.  Weakness and near syncope were thought to be due to dehydration.  Pt was hypokalemic in the hospital- due for recheck.  Pt denies current palpitations, CP, SOB.  Pt reports he is feeling better, feeling stronger.  No longer feeling weak or dizzy.  No additional nose bleeds.  Some edema of bilateral LEs.   Review of Systems For ROS see HPI     Objective:   Physical Exam  Constitutional: He is oriented to person, place, and time. He appears well-developed and well-nourished. No distress.  HENT:  Head: Normocephalic and atraumatic.  Eyes: Conjunctivae and EOM are normal. Pupils are equal, round, and reactive to light.  Neck: Normal range of motion. Neck supple. No thyromegaly present.  Cardiovascular: Normal rate, normal heart sounds and intact distal pulses.   No murmur heard. Irregularly irregular S1/S2  Pulmonary/Chest: Effort normal and breath sounds normal. No respiratory distress.  Abdominal: Soft. Bowel sounds are normal. He exhibits no distension.  Musculoskeletal: He exhibits edema. Tenderness: 1+ LE edema bilaterally.  Lymphadenopathy:    He has no cervical adenopathy.  Neurological: He is alert and oriented to person, place, and time. No cranial nerve deficit.  Skin: Skin is warm and dry.  Psychiatric: He has a normal mood and affect. His behavior is normal.  Vitals reviewed.         Assessment & Plan:

## 2014-09-23 NOTE — Assessment & Plan Note (Addendum)
Chronic problem.  Currently asymptomatic.  Now rate controlled but still irregular.  Continues on Dilt, higher dose of metoprolol.  Some bilateral LE edema which could be due to aggressive re-hydration w/ IVF while hospitalized.  Lungs are clear and no evidence of CHF on exam.  Advised low salt diet and elevation of legs.  Has cards f/u tomorrow.  Will continue to follow along.

## 2014-09-23 NOTE — Assessment & Plan Note (Signed)
Chronic problem.  Pt was switched from Xarelto to Pradaxa due to uncontrolled epistaxis and fact that he is considered high risk for re-bleed.

## 2014-09-23 NOTE — Progress Notes (Signed)
Pre visit review using our clinic review tool, if applicable. No additional management support is needed unless otherwise documented below in the visit note. 

## 2014-09-23 NOTE — Patient Instructions (Signed)
Follow up as scheduled We'll notify you of your lab results and make any changes if needed Keep up the good work!  You look great! Try and elevate your legs and limit your salt to improve the swelling Drink plenty of fluids REST! Call with any questions or concerns Hang in there!!!

## 2014-09-23 NOTE — Assessment & Plan Note (Signed)
Noted while hospitalized.  Recheck labs today and replete prn.

## 2014-09-23 NOTE — Addendum Note (Signed)
Addended by: Peggyann Shoals on: 09/23/2014 11:39 AM   Modules accepted: Orders

## 2014-09-24 ENCOUNTER — Encounter: Payer: Self-pay | Admitting: Physician Assistant

## 2014-09-24 ENCOUNTER — Ambulatory Visit (INDEPENDENT_AMBULATORY_CARE_PROVIDER_SITE_OTHER): Payer: Medicare Other | Admitting: Physician Assistant

## 2014-09-24 VITALS — BP 142/96 | HR 78 | Ht 75.0 in | Wt 216.3 lb

## 2014-09-24 DIAGNOSIS — Z7901 Long term (current) use of anticoagulants: Secondary | ICD-10-CM | POA: Diagnosis not present

## 2014-09-24 DIAGNOSIS — I482 Chronic atrial fibrillation, unspecified: Secondary | ICD-10-CM

## 2014-09-24 DIAGNOSIS — I10 Essential (primary) hypertension: Secondary | ICD-10-CM | POA: Diagnosis not present

## 2014-09-24 DIAGNOSIS — R600 Localized edema: Secondary | ICD-10-CM

## 2014-09-24 MED ORDER — METOPROLOL TARTRATE 100 MG PO TABS: 100.0000 mg | ORAL_TABLET | Freq: Two times a day (BID) | ORAL | Status: DC

## 2014-09-24 MED ORDER — FUROSEMIDE 20 MG PO TABS: ORAL_TABLET | ORAL | Status: DC

## 2014-09-24 MED ORDER — DABIGATRAN ETEXILATE MESYLATE 150 MG PO CAPS: 150.0000 mg | ORAL_CAPSULE | Freq: Two times a day (BID) | ORAL | Status: DC

## 2014-09-24 NOTE — Assessment & Plan Note (Signed)
Rate well-controlled on diltiazem and Lopressor.

## 2014-09-24 NOTE — Patient Instructions (Signed)
Take Lasix (Furosemide) 20mg  daily as needed for swelling.  Your physician recommends that you schedule a follow-up appointment in: 3 months with Dr. Peter Martinique.

## 2014-09-24 NOTE — Assessment & Plan Note (Signed)
Patient had noticed worsening lower extremity edema since discharge from the hospital. He does not appear to be in overt heart failure. I think the hospital weight of 198 is an accurate.  I would add 20 mg of Lasix on as-needed basis for edema. He will take for the next couple of days and see if there's improvement. He will also take an extra potassium each time he takes Lasix.

## 2014-09-24 NOTE — Progress Notes (Signed)
Patient ID: [name], male   DOB: [birth year], 74 y.o.   MRN: [id number]    Date:  09/24/2014   ID:  [name], DOB 1941-08-01, MRN [id number]  PCP:  Samuel Asa, MD  Primary Cardiologist:  [name]  Chief Complaint  Patient presents with  . Follow-up    Post Hospital:  Bilateral ankle edema since hospitalization.  No complaints of chest pain, SOB, or dizziness.     History of Present Illness: [name] is a 74 y.o. male with a history of atrial fibrillation and HTN. He has a history of atrial fibrillation dating back a number of years. He was initially placed on Sotalol but with recurrence we have been treating him with rate control only. In 01/15/2014 his BP was high and hydralazine was added.  He was recently hospitalized with A. fib RVR and epistaxis.  Lopressor was added Xarelto was changed to Pradaxa.  Patient presents today for posthospital follow-up. He reports feeling much better however he's noticed worsening of lower extremity edema since discharge. He denies shortness of breath or orthopnea.  Last echocardiogram was August 2014 showed normal systolic function with an EF of 55-60%. Trivial AI and mild MR.  Patient's weight in December during his last office visit was 212 pounds today is 216 think the weight in the hospital was a anomalous reading.    The patient currently denies nausea, vomiting, fever, chest pain, dizziness, PND, cough, congestion, abdominal pain, hematochezia, melena, lower extremity edema, claudication.  Wt Readings from Last 3 Encounters:  09/24/14 216 lb 4.8 oz (98.113 kg)  09/23/14 217 lb 4 oz (98.544 kg)  08/31/14 198 lb 13.7 oz (90.2 kg)     Past Medical History  Diagnosis Date  . Chronic atrial fibrillation   . Hypertension   . Erectile dysfunction   . Hypokalemia   . Meningioma     Probable 13 x 12 mm meningioma in right frontal region  . Thrombocytopenia     improved  . Chronic dermatitis   . CKD (chronic kidney disease), stage  III     a. Probable based on historical Cr. H/o ARF. Previously saw Dr. Florene Glen.    Current Outpatient Prescriptions  Medication Sig Dispense Refill  . Ascorbic Acid (VITAMIN C) 1000 MG tablet Take 1,000 mg by mouth daily.    . benazepril (LOTENSIN) 20 MG tablet Take 1 tablet (20 mg total) by mouth 2 (two) times daily. 180 tablet 3  . Cholecalciferol (VITAMIN D-3) 5000 UNITS TABS Take 1 tablet by mouth daily.    . dabigatran (PRADAXA) 150 MG CAPS capsule Take 1 capsule (150 mg total) by mouth every 12 (twelve) hours. 180 capsule 3  . diltiazem (DILACOR XR) 240 MG 24 hr capsule Take 1 capsule (240 mg total) by mouth daily. 90 capsule 1  . Garlic 4854 MG CAPS Take 1 capsule by mouth daily.    . hydrALAZINE (APRESOLINE) 25 MG tablet Take 1 tablet (25 mg total) by mouth 3 (three) times daily. 270 tablet 3  . metoprolol (LOPRESSOR) 100 MG tablet Take 1 tablet (100 mg total) by mouth 2 (two) times daily. 180 tablet 3  . Multiple Vitamin (MULTIVITAMIN) tablet Take 1 tablet by mouth daily.    . Omega-3 Fatty Acids (FISH OIL) 1000 MG CAPS Take 1 capsule by mouth daily.     . potassium chloride SA (KLOR-CON M20) 20 MEQ tablet Take 1 tablet (20 mEq total) by mouth daily. 90 tablet 1  . Propylene  Glycol 0.6 % SOLN Apply 2-3 drops to eye daily as needed (for eyes).    . Saw Palmetto, Serenoa repens, (SAW PALMETTO BERRIES) 540 MG CAPS Take 1,080 mg by mouth daily.      . sildenafil (VIAGRA) 100 MG tablet Take 100 mg by mouth daily as needed for erectile dysfunction.     . vitamin B-12 (CYANOCOBALAMIN) 500 MCG tablet Take 500 mcg by mouth daily.    . furosemide (LASIX) 20 MG tablet Take one tablet daily as needed for swelling. 30 tablet 3   No current facility-administered medications for this visit.    Allergies:    Allergies  Allergen Reactions  . Cipro Iv [Ciprofloxacin]   . Hctz [Hydrochlorothiazide] Photosensitivity    Social History:  The patient  reports that he quit smoking about 44 years  ago. His smoking use included Cigarettes. He has a 10 pack-year smoking history. He does not have any smokeless tobacco history on file. He reports that he does not drink alcohol or use illicit drugs.   Family history:   Family History  Problem Relation Age of Onset  . Heart disease Mother     with pacemaker  . Diabetes Mother   . Asthma Mother   . Hypertension Father   . Cancer Father     pancreatic cancer  . Diabetes Brother   . Hypertension Sister   . Diabetes Sister   . Heart failure Mother     ROS:  Please see the history of present illness.  All other systems reviewed and negative.   PHYSICAL EXAM: VS:  BP 142/96 mmHg  Pulse 78  Ht 6\' 3"  (1.905 m)  Wt 216 lb 4.8 oz (98.113 kg)  BMI 27.04 kg/m2 Well nourished, well developed, in no acute distress HEENT: Pupils are equal round react to light accommodation extraocular movements are intact.  Neck: no JVDNo cervical lymphadenopathy. Cardiac: Irregular rhythm without murmurs rubs or gallops. Lungs:  clear to auscultation bilaterally, no wheezing, rhonchi or rales Abd: soft, nontender, positive bowel sounds all quadrants, no hepatosplenomegaly Ext: 1-2+ lower extremity edema.  2+ radial and dorsalis pedis pulses. Skin: warm and dry Neuro:  Grossly normal    ASSESSMENT AND PLAN:  Problem List Items Addressed This Visit    Lower extremity edema - Primary    Patient had noticed worsening lower extremity edema since discharge from the hospital. He does not appear to be in overt heart failure. I think the hospital weight of 198 is an accurate.  I would add 20 mg of Lasix on as-needed basis for edema. He will take for the next couple of days and see if there's improvement. He will also take an extra potassium each time he takes Lasix.      Essential hypertension    Mildly elevated. No change in therapy other than adding when necessary Lasix      Relevant Medications   furosemide (LASIX) tablet   dabigatran (PRADAXA)  capsule 150 mg   metoprolol (LOPRESSOR) tablet   Chronic atrial fibrillation    Rate well-controlled on diltiazem and Lopressor.      Relevant Medications   furosemide (LASIX) tablet   dabigatran (PRADAXA) capsule 150 mg   metoprolol (LOPRESSOR) tablet   Chronic anticoagulation    Now on Primaxin after recent nosebleeding.

## 2014-09-24 NOTE — Assessment & Plan Note (Signed)
Now on Primaxin after recent nosebleeding.

## 2014-09-24 NOTE — Assessment & Plan Note (Signed)
Mildly elevated. No change in therapy other than adding when necessary Lasix

## 2014-09-29 ENCOUNTER — Telehealth: Payer: Self-pay | Admitting: Cardiology

## 2014-09-29 ENCOUNTER — Telehealth: Payer: Self-pay | Admitting: Physician Assistant

## 2014-09-29 NOTE — Telephone Encounter (Signed)
Clarified for pharmacy that patient is no longer on Xarelto. Encounter closed.

## 2014-09-29 NOTE — Telephone Encounter (Signed)
Provider should be Tarri Fuller.

## 2014-09-29 NOTE — Telephone Encounter (Signed)
Reference#[id number]     There is a drug interaction between Pradaxa and Xarelto,please advise.

## 2014-09-29 NOTE — Telephone Encounter (Signed)
Reference #[id number] There is an interaction between his Pradaxa and Xarelto,please advise.

## 2014-11-18 ENCOUNTER — Encounter: Payer: Self-pay | Admitting: Family Medicine

## 2014-11-18 ENCOUNTER — Ambulatory Visit (INDEPENDENT_AMBULATORY_CARE_PROVIDER_SITE_OTHER): Payer: Medicare Other | Admitting: Family Medicine

## 2014-11-18 ENCOUNTER — Ambulatory Visit: Payer: Medicare Other | Admitting: Family Medicine

## 2014-11-18 VITALS — BP 126/80 | HR 74 | Temp 97.9°F | Resp 16 | Ht 75.0 in | Wt 215.2 lb

## 2014-11-18 DIAGNOSIS — I10 Essential (primary) hypertension: Secondary | ICD-10-CM | POA: Diagnosis not present

## 2014-11-18 LAB — BASIC METABOLIC PANEL
BUN: 19 mg/dL (ref 6–23)
CHLORIDE: 105 meq/L (ref 96–112)
CO2: 26 meq/L (ref 19–32)
Calcium: 9.6 mg/dL (ref 8.4–10.5)
Creatinine, Ser: 1.19 mg/dL (ref 0.40–1.50)
GFR: 63.37 mL/min (ref >60.00)
GLUCOSE: 95 mg/dL (ref 70–99)
POTASSIUM: 3.8 meq/L (ref 3.5–5.1)
SODIUM: 138 meq/L (ref 135–145)

## 2014-11-18 LAB — CBC WITH DIFFERENTIAL/PLATELET
BASOS ABS: 0 10*3/uL (ref 0.0–0.1)
BASOS PCT: 0.4 % (ref 0.0–3.0)
EOS PCT: 0.6 % (ref 0.0–5.0)
Eosinophils Absolute: 0 10*3/uL (ref 0.0–0.7)
HEMATOCRIT: 38.4 % — AB (ref 39.0–52.0)
Hemoglobin: 12.8 g/dL — ABNORMAL LOW (ref 13.0–17.0)
LYMPHS ABS: 1.2 10*3/uL (ref 0.7–4.0)
Lymphocytes Relative: 19.5 % (ref 12.0–46.0)
MCHC: 33.3 g/dL (ref 30.0–36.0)
MCV: 87.2 fl (ref 78.0–100.0)
Monocytes Absolute: 0.5 10*3/uL (ref 0.1–1.0)
Monocytes Relative: 7.8 % (ref 3.0–12.0)
NEUTROS PCT: 71.7 % (ref 43.0–77.0)
Neutro Abs: 4.5 10*3/uL (ref 1.4–7.7)
Platelets: 176 10*3/uL (ref 150.0–400.0)
RBC: 4.41 Mil/uL (ref 4.22–5.81)
RDW: 16.6 % — ABNORMAL HIGH (ref 11.5–15.5)
WBC: 6.3 10*3/uL (ref 4.0–10.5)

## 2014-11-18 LAB — TSH: TSH: 1.75 u[IU]/mL (ref 0.35–4.50)

## 2014-11-18 LAB — HEPATIC FUNCTION PANEL
ALT: 16 U/L (ref 0–53)
AST: 20 U/L (ref 0–37)
Albumin: 4.2 g/dL (ref 3.5–5.2)
Alkaline Phosphatase: 80 U/L (ref 39–117)
BILIRUBIN TOTAL: 1.2 mg/dL (ref 0.2–1.2)
Bilirubin, Direct: 0.3 mg/dL (ref 0.0–0.3)
Total Protein: 7.2 g/dL (ref 6.0–8.3)

## 2014-11-18 LAB — LIPID PANEL
CHOLESTEROL: 99 mg/dL (ref 0–200)
HDL: 47.1 mg/dL (ref >39.00)
LDL Cholesterol: 41 mg/dL (ref 0–99)
NonHDL: 51.9
Total CHOL/HDL Ratio: 2
Triglycerides: 54 mg/dL (ref 0.0–149.0)
VLDL: 10.8 mg/dL (ref 0.0–40.0)

## 2014-11-18 MED ORDER — POTASSIUM CHLORIDE CRYS ER 20 MEQ PO TBCR
20.0000 meq | EXTENDED_RELEASE_TABLET | Freq: Every day | ORAL | Status: DC
Start: 2014-11-18 — End: 2015-02-22

## 2014-11-18 MED ORDER — DILTIAZEM HCL ER 240 MG PO CP24
240.0000 mg | ORAL_CAPSULE | Freq: Every day | ORAL | Status: DC
Start: 2014-11-18 — End: 2014-12-06

## 2014-11-18 MED ORDER — BENAZEPRIL HCL 20 MG PO TABS: 20.0000 mg | ORAL_TABLET | Freq: Two times a day (BID) | ORAL | Status: DC

## 2014-11-18 NOTE — Progress Notes (Signed)
Pre visit review using our clinic review tool, if applicable. No additional management support is needed unless otherwise documented below in the visit note. 

## 2014-11-18 NOTE — Patient Instructions (Signed)
Schedule your complete physical in 6 months We'll notify you of your lab results and make any changes if needed Keep up the good work!  You look great! Call with any questions or concerns Happy Spring!!! 

## 2014-11-18 NOTE — Progress Notes (Signed)
   Subjective:    Patient ID: [name], male    DOB: 10-May-1941, 74 y.o.   MRN: [id number]  HPI HTN- chronic problem, excellent control.  On Benazepril, Dilt, Lasix, Hydralazine, Metoprolol.  'i've been feeling great'.  No CP, SOB, HAs, visual changes, edema above baseline, abd pain, N/V.     Review of Systems For ROS see HPI     Objective:   Physical Exam  Constitutional: He is oriented to person, place, and time. He appears well-developed and well-nourished. No distress.  HENT:  Head: Normocephalic and atraumatic.  Eyes: Conjunctivae and EOM are normal. Pupils are equal, round, and reactive to light.  Neck: Normal range of motion. Neck supple. No thyromegaly present.  Cardiovascular: Normal rate, normal heart sounds and intact distal pulses.   No murmur heard. Irregularly irregular S1/S2  Pulmonary/Chest: Effort normal and breath sounds normal. No respiratory distress.  Abdominal: Soft. Bowel sounds are normal. He exhibits no distension.  Musculoskeletal: He exhibits no edema.  Lymphadenopathy:    He has no cervical adenopathy.  Neurological: He is alert and oriented to person, place, and time. No cranial nerve deficit.  Skin: Skin is warm and dry.  Psychiatric: He has a normal mood and affect. His behavior is normal.  Vitals reviewed.         Assessment & Plan:

## 2014-11-18 NOTE — Assessment & Plan Note (Signed)
Pt's BP is well controlled.  Asymptomatic.  Check labs.  No anticipated med changes.  Will follow. 

## 2014-11-22 ENCOUNTER — Telehealth: Payer: Self-pay | Admitting: Family Medicine

## 2014-11-22 NOTE — Telephone Encounter (Signed)
Yes continue both  Alexsandria Kivett Martinique MD, [facility]

## 2014-11-22 NOTE — Telephone Encounter (Signed)
Caller name: Ileene Hutchinson  Call back number: [phone] Pharmacy: [facility]   Reason for call:   Talanai from [place] called regarding a drug interaction between the following medication diltiazem (DILACOR XR) 240 MG 24 hr capsule & metoprolol (LOPRESSOR) 100 MG tablet. Please advise.  Reference # [id number]

## 2014-11-22 NOTE — Telephone Encounter (Signed)
Clarification faxed to pharmacy along with this phone note.

## 2014-11-22 NOTE — Telephone Encounter (Signed)
Dr. Birdie Riddle refilled the PT's diltiazem at last OV on 11-18-14. Received this message today.Could you please advise pt if ok to continue both medications?

## 2014-12-06 ENCOUNTER — Telehealth: Payer: Self-pay | Admitting: Family Medicine

## 2014-12-06 MED ORDER — DILTIAZEM HCL ER 240 MG PO CP24: 240.0000 mg | ORAL_CAPSULE | Freq: Every day | ORAL | Status: DC

## 2014-12-06 NOTE — Telephone Encounter (Signed)
Caller name: Trejos,Laura Relation to pt: spouse  Call back number: [phone] Pharmacy: [facility] [place], [place]  Reason for call:  Pharmacy in need of clarification regarding diltiazem (DILACOR XR) 240 MG 24 hr capsule

## 2014-12-06 NOTE — Telephone Encounter (Signed)
Please send 90 day supply to  [facility] [address] [place] [address] [place] [phone] (Phone) [phone] (Fax)       Instead of mail order.

## 2014-12-06 NOTE — Telephone Encounter (Signed)
Med filled for 90 days to local pharmacy.

## 2014-12-30 ENCOUNTER — Ambulatory Visit (INDEPENDENT_AMBULATORY_CARE_PROVIDER_SITE_OTHER): Payer: Medicare Other | Admitting: Cardiology

## 2014-12-30 ENCOUNTER — Encounter: Payer: Self-pay | Admitting: Cardiology

## 2014-12-30 VITALS — BP 150/99 | HR 68 | Ht 74.0 in | Wt 211.6 lb

## 2014-12-30 DIAGNOSIS — Z7901 Long term (current) use of anticoagulants: Secondary | ICD-10-CM | POA: Diagnosis not present

## 2014-12-30 DIAGNOSIS — N183 Chronic kidney disease, stage 3 unspecified: Secondary | ICD-10-CM

## 2014-12-30 DIAGNOSIS — I4891 Unspecified atrial fibrillation: Secondary | ICD-10-CM

## 2014-12-30 DIAGNOSIS — R600 Localized edema: Secondary | ICD-10-CM | POA: Diagnosis not present

## 2014-12-30 MED ORDER — DABIGATRAN ETEXILATE MESYLATE 150 MG PO CAPS: 150.0000 mg | ORAL_CAPSULE | Freq: Two times a day (BID) | ORAL | Status: DC

## 2014-12-30 MED ORDER — METOPROLOL TARTRATE 100 MG PO TABS: 100.0000 mg | ORAL_TABLET | Freq: Two times a day (BID) | ORAL | Status: DC

## 2014-12-30 NOTE — Patient Instructions (Signed)
Continue your current therapy  I will see you in one year   

## 2014-12-30 NOTE — Progress Notes (Signed)
[name] Date of Birth: [birth year]   History of Present Illness: [name] is seen today for followup of his atrial fibrillation and HTN. He has a history of atrial fibrillation dating back a number of years. He was initially placed on Sotalol but with recurrence so we have been treating him with rate control only.  He was admitted in January 2016 with severe epistaxis requiring packing. His HR was high and metoprolol was added. Following this he felt very weak and had some leg edema. These symptoms have all resolved and today he feels great. No recurrent epistaxis. No chest pain, dyspnea, edema, or palpitations. Weight is down 5 lbs.  Current Outpatient Prescriptions on File Prior to Visit  Medication Sig Dispense Refill  . Ascorbic Acid (VITAMIN C) 1000 MG tablet Take 1,000 mg by mouth daily.    . benazepril (LOTENSIN) 20 MG tablet Take 1 tablet (20 mg total) by mouth 2 (two) times daily. 180 tablet 1  . Cholecalciferol (VITAMIN D-3) 5000 UNITS TABS Take 1 tablet by mouth daily.    Marland Kitchen diltiazem (DILACOR XR) 240 MG 24 hr capsule Take 1 capsule (240 mg total) by mouth daily. 90 capsule 1  . furosemide (LASIX) 20 MG tablet Take one tablet daily as needed for swelling. 30 tablet 3  . Garlic 6295 MG CAPS Take 1 capsule by mouth daily.    . hydrALAZINE (APRESOLINE) 25 MG tablet Take 1 tablet (25 mg total) by mouth 3 (three) times daily. 270 tablet 3  . Multiple Vitamin (MULTIVITAMIN) tablet Take 1 tablet by mouth daily.    . Omega-3 Fatty Acids (FISH OIL) 1000 MG CAPS Take 1 capsule by mouth daily.     . potassium chloride SA (KLOR-CON M20) 20 MEQ tablet Take 1 tablet (20 mEq total) by mouth daily. 90 tablet 1  . Propylene Glycol 0.6 % SOLN Apply 2-3 drops to eye daily as needed (for eyes).    . Saw Palmetto, Serenoa repens, (SAW PALMETTO BERRIES) 540 MG CAPS Take 1,080 mg by mouth daily.      . sildenafil (VIAGRA) 100 MG tablet Take 100 mg by mouth daily as needed for erectile dysfunction.      . vitamin B-12 (CYANOCOBALAMIN) 500 MCG tablet Take 500 mcg by mouth daily.     No current facility-administered medications on file prior to visit.    Allergies  Allergen Reactions  . Cipro Iv [Ciprofloxacin]   . Hctz [Hydrochlorothiazide] Photosensitivity    Past Medical History  Diagnosis Date  . Chronic atrial fibrillation   . Hypertension   . Erectile dysfunction   . Hypokalemia   . Meningioma     Probable 13 x 12 mm meningioma in right frontal region  . Thrombocytopenia     improved  . Chronic dermatitis   . CKD (chronic kidney disease), stage III     a. Probable based on historical Cr. H/o ARF. Previously saw Dr. Florene Glen.    Past Surgical History  Procedure Laterality Date  . Tonsillectomy    . Eye surgery      spot removed from cornea  . Toenail excision      ingrown toenail removal  . Tee with cardioversion  12/14/2004    Successful TEE guided electrical cardioversion. -- showed mild mitral insufficiency and trace pulmonic insufficiency     History  Smoking status  . Former Smoker -- 1.00 packs/day for 10 years  . Types: Cigarettes  . Quit date: 04/25/1970  Smokeless tobacco  .  Not on file    History  Alcohol Use No    Family History  Problem Relation Age of Onset  . Heart disease Mother     with pacemaker  . Diabetes Mother   . Asthma Mother   . Hypertension Father   . Cancer Father     pancreatic cancer  . Diabetes Brother   . Hypertension Sister   . Diabetes Sister   . Heart failure Mother     Review of Systems: As noted in history of present illness.  All other systems were reviewed and are negative.  Physical Exam: BP 150/99 mmHg  Pulse 68  Ht 6\' 2"  (1.88 m)  Wt 95.981 kg (211 lb 9.6 oz)  BMI 27.16 kg/m2 WDWM in NAD.  The HEENT exam is normal. The carotids are 2+ without bruits.  There is no thyromegaly.  There is no JVD.  The lungs are clear.   The heart exam reveals an irregular rate with a normal S1 and S2.  There are no  murmurs, gallops, or rubs.  The PMI is not displaced.   Abdominal exam reveals good bowel sounds. There is no hepatosplenomegaly or tenderness.  There are no masses.  Exam of the legs reveal no clubbing, cyanosis, or edema.   The distal pulses are intact.  Cranial nerves II - XII are intact.  Motor and sensory functions are intact.  The gait is normal.  LABORATORY DATA: Lab Results  Component Value Date   WBC 6.3 11/18/2014   HGB 12.8* 11/18/2014   HCT 38.4* 11/18/2014   PLT 176.0 11/18/2014   GLUCOSE 95 11/18/2014   CHOL 99 11/18/2014   TRIG 54.0 11/18/2014   HDL 47.10 11/18/2014   LDLCALC 41 11/18/2014   ALT 16 11/18/2014   AST 20 11/18/2014   NA 138 11/18/2014   K 3.8 11/18/2014   CL 105 11/18/2014   CREATININE 1.19 11/18/2014   BUN 19 11/18/2014   CO2 26 11/18/2014   TSH 1.75 11/18/2014   PSA 3.27 11/11/2013   INR 1.74* 08/27/2014       Assessment / Plan: 1. Atrial fibrillation. Now permanent. Patient is asymptomatic. His rate is well controlled on diltiazem and metoprolol. Continue Pradaxa. He is instructed to avoid nonsteroidal anti-inflammatory agents.  I would recommend a long-term strategy of rate control and anticoagulation.   2. Hypertension. Blood pressure is elevated today but he reports BP 947-096 systolic at home. Will monitor.   I will follow up in 1 year.

## 2015-01-20 ENCOUNTER — Other Ambulatory Visit: Payer: Self-pay

## 2015-01-20 MED ORDER — HYDRALAZINE HCL 25 MG PO TABS: 25.0000 mg | ORAL_TABLET | Freq: Three times a day (TID) | ORAL | Status: DC

## 2015-01-21 ENCOUNTER — Telehealth: Payer: Self-pay

## 2015-01-21 NOTE — Telephone Encounter (Signed)
Prior auth request for Hydralazine, but not sure this is really needed because he is using a discount card. Will call [facility] pharmacy to get more info.

## 2015-02-22 ENCOUNTER — Telehealth: Payer: Self-pay | Admitting: Family Medicine

## 2015-02-22 MED ORDER — POTASSIUM CHLORIDE CRYS ER 20 MEQ PO TBCR: 20.0000 meq | EXTENDED_RELEASE_TABLET | Freq: Every day | ORAL | Status: DC

## 2015-02-22 NOTE — Telephone Encounter (Signed)
Caller name: Damel Relation to pt: Call back number: [phone] Pharmacy: [facility] on [address]  Reason for call:   Patient is requesting a refill of potassium. Patient requesting callback when sent.

## 2015-02-22 NOTE — Telephone Encounter (Signed)
Med filled, pt notified.

## 2015-03-10 ENCOUNTER — Telehealth: Payer: Self-pay | Admitting: Family Medicine

## 2015-03-10 MED ORDER — BENAZEPRIL HCL 20 MG PO TABS: 20.0000 mg | ORAL_TABLET | Freq: Two times a day (BID) | ORAL | Status: DC

## 2015-03-10 NOTE — Telephone Encounter (Signed)
Medication filled to pharmacy as requested.   

## 2015-03-10 NOTE — Telephone Encounter (Signed)
Pharmacy: [facility] [address]  Reason for call: Pt needing refill on benazepril (LOTENSIN) 20 MG tablet. He has 2-3 days on hand. He had refill at [facility] but not using them anymore. He had issues with getting RXs so he is going back to local pharmacy.

## 2015-05-20 ENCOUNTER — Ambulatory Visit (INDEPENDENT_AMBULATORY_CARE_PROVIDER_SITE_OTHER): Payer: Medicare Other | Admitting: Family Medicine

## 2015-05-20 ENCOUNTER — Encounter: Payer: Self-pay | Admitting: Family Medicine

## 2015-05-20 VITALS — BP 128/86 | HR 80 | Temp 97.9°F | Resp 16 | Ht 74.0 in | Wt 211.0 lb

## 2015-05-20 DIAGNOSIS — N183 Chronic kidney disease, stage 3 unspecified: Secondary | ICD-10-CM

## 2015-05-20 DIAGNOSIS — Z125 Encounter for screening for malignant neoplasm of prostate: Secondary | ICD-10-CM

## 2015-05-20 DIAGNOSIS — I4891 Unspecified atrial fibrillation: Secondary | ICD-10-CM | POA: Diagnosis not present

## 2015-05-20 DIAGNOSIS — Z0000 Encounter for general adult medical examination without abnormal findings: Secondary | ICD-10-CM | POA: Diagnosis not present

## 2015-05-20 DIAGNOSIS — Z23 Encounter for immunization: Secondary | ICD-10-CM | POA: Diagnosis not present

## 2015-05-20 DIAGNOSIS — I10 Essential (primary) hypertension: Secondary | ICD-10-CM

## 2015-05-20 LAB — CBC WITH DIFFERENTIAL/PLATELET
BASOS ABS: 0 10*3/uL (ref 0.0–0.1)
Basophils Relative: 0.4 % (ref 0.0–3.0)
EOS PCT: 1 % (ref 0.0–5.0)
Eosinophils Absolute: 0.1 10*3/uL (ref 0.0–0.7)
HCT: 41.5 % (ref 39.0–52.0)
Hemoglobin: 13.8 g/dL (ref 13.0–17.0)
Lymphocytes Relative: 24.1 % (ref 12.0–46.0)
Lymphs Abs: 1.4 10*3/uL (ref 0.7–4.0)
MCHC: 33.3 g/dL (ref 30.0–36.0)
MCV: 93.9 fl (ref 78.0–100.0)
MONO ABS: 0.5 10*3/uL (ref 0.1–1.0)
Monocytes Relative: 8.1 % (ref 3.0–12.0)
NEUTROS PCT: 66.4 % (ref 43.0–77.0)
Neutro Abs: 3.8 10*3/uL (ref 1.4–7.7)
Platelets: 149 10*3/uL — ABNORMAL LOW (ref 150.0–400.0)
RBC: 4.42 Mil/uL (ref 4.22–5.81)
RDW: 14.9 % (ref 11.5–15.5)
WBC: 5.7 10*3/uL (ref 4.0–10.5)

## 2015-05-20 LAB — LIPID PANEL
Cholesterol: 111 mg/dL (ref 0–200)
HDL: 47.5 mg/dL (ref >39.00)
LDL CALC: 51 mg/dL (ref 0–99)
NONHDL: 63.09
Total CHOL/HDL Ratio: 2
Triglycerides: 62 mg/dL (ref 0.0–149.0)
VLDL: 12.4 mg/dL (ref 0.0–40.0)

## 2015-05-20 LAB — BASIC METABOLIC PANEL
BUN: 25 mg/dL — ABNORMAL HIGH (ref 6–23)
CO2: 29 meq/L (ref 19–32)
CREATININE: 1.36 mg/dL (ref 0.40–1.50)
Calcium: 9.5 mg/dL (ref 8.4–10.5)
Chloride: 107 mEq/L (ref 96–112)
GFR: 54.24 mL/min — ABNORMAL LOW (ref >60.00)
Glucose, Bld: 96 mg/dL (ref 70–99)
Potassium: 4.3 mEq/L (ref 3.5–5.1)
Sodium: 144 mEq/L (ref 135–145)

## 2015-05-20 LAB — HEPATIC FUNCTION PANEL
ALT: 12 U/L (ref 0–53)
AST: 17 U/L (ref 0–37)
Albumin: 4.3 g/dL (ref 3.5–5.2)
Alkaline Phosphatase: 71 U/L (ref 39–117)
Bilirubin, Direct: 0.3 mg/dL (ref 0.0–0.3)
TOTAL PROTEIN: 6.9 g/dL (ref 6.0–8.3)
Total Bilirubin: 1.1 mg/dL (ref 0.2–1.2)

## 2015-05-20 LAB — TSH: TSH: 1.63 u[IU]/mL (ref 0.35–4.50)

## 2015-05-20 LAB — PSA: PSA: 3.02 ng/mL (ref 0.10–4.00)

## 2015-05-20 NOTE — Patient Instructions (Signed)
Follow up in 6 months w/ Dr Etter Sjogren to recheck BP We'll notify you of your lab results and make any changes if needed Keep up the good work!  You look great! You are up to date on your colonoscopy until 2020- yay! Call [facility] [phone] to see if they can help you out Call [facility] to see if they can assist you with transport [phone] Call with any questions or concerns Hang in there!! Happy Fall!!!

## 2015-05-20 NOTE — Progress Notes (Signed)
   Subjective:    Patient ID: [name], male    DOB: 08-02-1941, 74 y.o.   MRN: [id number]  HPI Here today for CPE.  Risk Factors: HTN- chronic problem, well controlled on Benazepril, Dilt, Lasix, Hydralazine, Metoprolol.  Denies CP, SOB, HAs, visual changes, edema. Afib- chronic problem, following w/ Dr Martinique.  On Pradaxa daily.  Denies palpitations, irregular bleeding. CKD- stage III, previously seeing nephrology (Dr Florene Glen) Physical Activity: very active but no formal exercise Fall Risk: low Depression: denies Hearing: normal to conversational tones, decreased to whispered voice ADL's: independent Cognitive: normal linear thought process, memory and attention intact Home Safety: safe at home, lives w/ wife and disabled sister Height, Weight, BMI, Visual Acuity: see vitals, vision corrected to 20/20 w/ glasses Counseling: UTD on colonoscopy (Dr Deatra Ina, due 2020), urology- Dr Risa Grill Care team reviewed and updated Labs Ordered: See A&P Care Plan: See A&P    Review of Systems Patient reports no vision/hearing changes, anorexia, fever ,adenopathy, persistant/recurrent hoarseness, swallowing issues, chest pain, palpitations, edema, persistant/recurrent cough, hemoptysis, dyspnea (rest,exertional, paroxysmal nocturnal), gastrointestinal  bleeding (melena, rectal bleeding), abdominal pain, excessive heart burn, GU symptoms (dysuria, hematuria, voiding/incontinence issues) syncope, focal weakness, memory loss, numbness & tingling, skin/hair/nail changes, depression, anxiety, abnormal bruising/bleeding, musculoskeletal symptoms/signs.     Objective:   Physical Exam General Appearance:    Alert, cooperative, no distress, appears stated age  Head:    Normocephalic, without obvious abnormality, atraumatic  Eyes:    PERRL, conjunctiva/corneas clear, EOM's intact, fundi    benign, both eyes       Ears:    Normal TM's and external ear canals, both ears  Nose:   Nares normal, septum  midline, mucosa normal, no drainage   or sinus tenderness  Throat:   Lips, mucosa, and tongue normal; teeth and gums normal  Neck:   Supple, symmetrical, trachea midline, no adenopathy;       thyroid:  No enlargement/tenderness/nodules  Back:     Symmetric, no curvature, ROM normal, no CVA tenderness  Lungs:     Clear to auscultation bilaterally, respirations unlabored  Chest wall:    No tenderness or deformity  Heart:    Regular rate and rhythm, S1 and S2 normal, no murmur, rub   or gallop  Abdomen:     Soft, non-tender, bowel sounds active all four quadrants,    no masses, no organomegaly  Genitalia:    Deferred to urology  Rectal:    Extremities:   Extremities normal, atraumatic, no cyanosis or edema  Pulses:   2+ and symmetric all extremities  Skin:   Skin color, texture, turgor normal, no rashes or lesions  Lymph nodes:   Cervical, supraclavicular, and axillary nodes normal  Neurologic:   CNII-XII intact. Normal strength, sensation and reflexes      throughout          Assessment & Plan:

## 2015-05-20 NOTE — Progress Notes (Signed)
Pre visit review using our clinic review tool, if applicable. No additional management support is needed unless otherwise documented below in the visit note. 

## 2015-05-22 NOTE — Assessment & Plan Note (Signed)
Chronic problem.  Currently asymptomatic.  Cr has been stable.  Check labs.  Pt will re-establish w/ nephrology as needed

## 2015-05-22 NOTE — Assessment & Plan Note (Signed)
>>  ASSESSMENT AND PLAN FOR PROSTATE CANCER SCREENING WRITTEN ON 05/22/2015  7:12 PM BY Sheliah Hatch, MD  Check PSA

## 2015-05-22 NOTE — Assessment & Plan Note (Signed)
Pt's PE WNL.  UTD on colonoscopy.  See Dr Risa Grill (urology).  Flu shot given- immunizations now UTD.  Written screening schedule updated and given to pt.  Check labs.  Anticipatory guidance provided.

## 2015-05-22 NOTE — Assessment & Plan Note (Signed)
Chronic problem.  Well controlled.  Asymptomatic.  Check labs.  No anticipated med changes. 

## 2015-05-22 NOTE — Assessment & Plan Note (Signed)
Check PSA. ?

## 2015-05-22 NOTE — Assessment & Plan Note (Signed)
Chronic problem.  Following w/ Dr Martinique.  Rate controlled.  On chronic anticoagulation.  Will continue to follow.

## 2015-05-30 ENCOUNTER — Other Ambulatory Visit: Payer: Self-pay | Admitting: Family Medicine

## 2015-05-31 NOTE — Telephone Encounter (Signed)
Medication filled to pharmacy as requested.   

## 2015-08-18 ENCOUNTER — Other Ambulatory Visit: Payer: Self-pay | Admitting: Family Medicine

## 2015-09-09 ENCOUNTER — Other Ambulatory Visit: Payer: Self-pay | Admitting: Family Medicine

## 2015-09-12 NOTE — Telephone Encounter (Signed)
Medication filled to pharmacy as requested.   

## 2015-10-31 ENCOUNTER — Encounter (HOSPITAL_COMMUNITY): Payer: Self-pay | Admitting: *Deleted

## 2015-10-31 ENCOUNTER — Emergency Department (HOSPITAL_COMMUNITY)
Admission: EM | Admit: 2015-10-31 | Discharge: 2015-10-31 | Disposition: A | Discharge status: Home / Self Care | Payer: Medicare Other | Attending: Emergency Medicine | Admitting: Emergency Medicine

## 2015-10-31 ENCOUNTER — Emergency Department (HOSPITAL_COMMUNITY): Payer: Medicare Other

## 2015-10-31 DIAGNOSIS — Z87438 Personal history of other diseases of male genital organs: Secondary | ICD-10-CM | POA: Diagnosis not present

## 2015-10-31 DIAGNOSIS — N183 Chronic kidney disease, stage 3 (moderate): Secondary | ICD-10-CM | POA: Diagnosis not present

## 2015-10-31 DIAGNOSIS — I129 Hypertensive chronic kidney disease with stage 1 through stage 4 chronic kidney disease, or unspecified chronic kidney disease: Secondary | ICD-10-CM | POA: Insufficient documentation

## 2015-10-31 DIAGNOSIS — Z86018 Personal history of other benign neoplasm: Secondary | ICD-10-CM | POA: Insufficient documentation

## 2015-10-31 DIAGNOSIS — Z862 Personal history of diseases of the blood and blood-forming organs and certain disorders involving the immune mechanism: Secondary | ICD-10-CM | POA: Insufficient documentation

## 2015-10-31 DIAGNOSIS — Z87891 Personal history of nicotine dependence: Secondary | ICD-10-CM | POA: Insufficient documentation

## 2015-10-31 DIAGNOSIS — I482 Chronic atrial fibrillation: Secondary | ICD-10-CM | POA: Diagnosis not present

## 2015-10-31 DIAGNOSIS — Z872 Personal history of diseases of the skin and subcutaneous tissue: Secondary | ICD-10-CM | POA: Diagnosis not present

## 2015-10-31 DIAGNOSIS — M791 Myalgia: Secondary | ICD-10-CM | POA: Insufficient documentation

## 2015-10-31 DIAGNOSIS — Z79899 Other long term (current) drug therapy: Secondary | ICD-10-CM | POA: Insufficient documentation

## 2015-10-31 DIAGNOSIS — R509 Fever, unspecified: Secondary | ICD-10-CM | POA: Diagnosis present

## 2015-10-31 DIAGNOSIS — B349 Viral infection, unspecified: Secondary | ICD-10-CM | POA: Diagnosis not present

## 2015-10-31 LAB — CBC WITH DIFFERENTIAL/PLATELET
Basophils Absolute: 0 10*3/uL (ref 0.0–0.1)
Basophils Relative: 0 %
EOS PCT: 0 %
Eosinophils Absolute: 0 10*3/uL (ref 0.0–0.7)
HEMATOCRIT: 38.3 % — AB (ref 39.0–52.0)
Hemoglobin: 12.6 g/dL — ABNORMAL LOW (ref 13.0–17.0)
LYMPHS ABS: 0.5 10*3/uL — AB (ref 0.7–4.0)
LYMPHS PCT: 9 %
MCH: 30.4 pg (ref 26.0–34.0)
MCHC: 32.9 g/dL (ref 30.0–36.0)
MCV: 92.5 fL (ref 78.0–100.0)
MONO ABS: 0.6 10*3/uL (ref 0.1–1.0)
Monocytes Relative: 10 %
Neutro Abs: 4.8 10*3/uL (ref 1.7–7.7)
Neutrophils Relative %: 80 %
PLATELETS: 103 10*3/uL — AB (ref 150–400)
RBC: 4.14 MIL/uL — AB (ref 4.22–5.81)
RDW: 14.3 % (ref 11.5–15.5)
WBC: 5.9 10*3/uL (ref 4.0–10.5)

## 2015-10-31 LAB — BASIC METABOLIC PANEL
Anion gap: 11 (ref 5–15)
BUN: 20 mg/dL (ref 6–20)
CO2: 23 mmol/L (ref 22–32)
Calcium: 8.6 mg/dL — ABNORMAL LOW (ref 8.9–10.3)
Chloride: 104 mmol/L (ref 101–111)
Creatinine, Ser: 1.35 mg/dL — ABNORMAL HIGH (ref 0.61–1.24)
GFR calc Af Amer: 58 mL/min — ABNORMAL LOW (ref >60)
GFR, EST NON AFRICAN AMERICAN: 50 mL/min — AB (ref >60)
GLUCOSE: 145 mg/dL — AB (ref 65–99)
Potassium: 3.5 mmol/L (ref 3.5–5.1)
Sodium: 138 mmol/L (ref 135–145)

## 2015-10-31 MED ORDER — SODIUM CHLORIDE 0.9 % IV BOLUS (SEPSIS)
1000.0000 mL | Freq: Once | INTRAVENOUS | Status: AC
Start: 2015-10-31 — End: 2015-10-31
  Administered 2015-10-31: 1000 mL via INTRAVENOUS

## 2015-10-31 NOTE — ED Notes (Addendum)
Pt arrived by [facility] for cough, fever, bodyaches since Friday. Denies n/v/d. Mask on pt at triage.

## 2015-10-31 NOTE — ED Provider Notes (Signed)
CSN: [id number]     Arrival date & time 10/31/15  1750 History   First MD Initiated Contact with Patient 10/31/15 2115     Chief Complaint  Patient presents with  . Cough  . Fever      HPI  75 year old male with history of atrial fibrillation on Pradaxa, hypertension, stage III CKD, who presents with dry cough, low-grade fevers, diffuse body aches for the last 4 days. He is still tolerating oral intake. Endorses nausea but denies vomiting, abdominal pain, chest pain, shortness of breath, diarrhea. No known sick contacts. No recent illnesses. No additional complaints.lo  Past Medical History  Diagnosis Date  . Chronic atrial fibrillation ([place])   . Hypertension   . Erectile dysfunction   . Hypokalemia   . Meningioma ([facility])     Probable 13 x 12 mm meningioma in right frontal region  . Thrombocytopenia ([place])     improved  . Chronic dermatitis   . CKD (chronic kidney disease), stage III     a. Probable based on historical Cr. H/o ARF. Previously saw Dr. Florene Glen.   Past Surgical History  Procedure Laterality Date  . Tonsillectomy    . Eye surgery      spot removed from cornea  . Toenail excision      ingrown toenail removal  . Tee with cardioversion  12/14/2004    Successful TEE guided electrical cardioversion. -- showed mild mitral insufficiency and trace pulmonic insufficiency    Family History  Problem Relation Age of Onset  . Heart disease Mother     with pacemaker  . Diabetes Mother   . Asthma Mother   . Hypertension Father   . Cancer Father     pancreatic cancer  . Diabetes Brother   . Hypertension Sister   . Diabetes Sister   . Heart failure Mother    Social History  Substance Use Topics  . Smoking status: Former Smoker -- 1.00 packs/day for 10 years    Types: Cigarettes    Quit date: 04/25/1970  . Smokeless tobacco: None  . Alcohol Use: No    Review of Systems  Constitutional: Positive for fever and chills. Negative for activity change and appetite change.   HENT: Negative for congestion, facial swelling, rhinorrhea and sore throat.   Eyes: Negative for visual disturbance.  Respiratory: Positive for cough. Negative for shortness of breath and wheezing.   Cardiovascular: Negative for chest pain, palpitations and leg swelling.  Gastrointestinal: Positive for nausea. Negative for vomiting, abdominal pain, diarrhea, constipation and blood in stool.  Genitourinary: Negative for dysuria, frequency, hematuria, flank pain and difficulty urinating.  Musculoskeletal: Positive for myalgias. Negative for back pain, joint swelling, arthralgias, neck pain and neck stiffness.  Skin: Negative for rash.  Neurological: Negative for dizziness, weakness, light-headedness and headaches.  Psychiatric/Behavioral: Negative for behavioral problems, confusion and agitation.      Allergies  Cipro iv and Hctz  Home Medications   Prior to Admission medications   Medication Sig Start Date End Date Taking? Authorizing Provider  Ascorbic Acid (VITAMIN C) 1000 MG tablet Take 1,000 mg by mouth daily.   Yes Historical Provider, MD  benazepril (LOTENSIN) 20 MG tablet TAKE ONE TABLET BY MOUTH TWICE DAILY 09/12/15  Yes Midge Minium, MD  Cholecalciferol (VITAMIN D-3) 5000 UNITS TABS Take 1 tablet by mouth daily.   Yes Historical Provider, MD  dabigatran (PRADAXA) 150 MG CAPS capsule Take 1 capsule (150 mg total) by mouth every 12 (twelve) hours. 12/30/14  Yes Peter M Martinique, MD  diltiazem (DILACOR XR) 240 MG 24 hr capsule TAKE ONE CAPSULE BY MOUTH ONCE DAILY 05/31/15  Yes Midge Minium, MD  furosemide (LASIX) 20 MG tablet Take one tablet daily as needed for swelling. 09/24/14  Yes Brett Canales, PA-C  Garlic 123XX123 MG CAPS Take 1 capsule by mouth daily.   Yes Historical Provider, MD  hydrALAZINE (APRESOLINE) 25 MG tablet Take 1 tablet (25 mg total) by mouth 3 (three) times daily. 01/20/15  Yes Peter M Martinique, MD  KLOR-CON M20 20 MEQ tablet TAKE ONE TABLET BY MOUTH ONCE  DAILY 08/18/15  Yes Midge Minium, MD  metoprolol (LOPRESSOR) 100 MG tablet Take 1 tablet (100 mg total) by mouth 2 (two) times daily. 12/30/14  Yes Peter M Martinique, MD  Multiple Vitamin (MULTIVITAMIN) tablet Take 1 tablet by mouth daily.   Yes Historical Provider, MD  Omega-3 Fatty Acids (FISH OIL) 1000 MG CAPS Take 1 capsule by mouth daily.    Yes Historical Provider, MD  Propylene Glycol 0.6 % SOLN Apply 2-3 drops to eye daily as needed (for eyes).   Yes Historical Provider, MD  Saw Palmetto, Serenoa repens, (SAW PALMETTO BERRIES) 540 MG CAPS Take 1,080 mg by mouth daily.     Yes Historical Provider, MD  sildenafil (VIAGRA) 100 MG tablet Take 100 mg by mouth daily as needed for erectile dysfunction.    Yes Historical Provider, MD  vitamin B-12 (CYANOCOBALAMIN) 500 MCG tablet Take 500 mcg by mouth daily.   Yes Historical Provider, MD   BP 131/83 mmHg  Pulse 92  Temp(Src) 99.5 F (37.5 C) (Oral)  Resp 22  SpO2 96% Physical Exam  Constitutional: He is oriented to person, place, and time. He appears well-developed and well-nourished. No distress.  HENT:  Head: Normocephalic and atraumatic.  Right Ear: External ear normal.  Left Ear: External ear normal.  Nose: Nose normal.  Mouth/Throat: Oropharynx is clear and moist. No oropharyngeal exudate.  Eyes: Conjunctivae are normal. Pupils are equal, round, and reactive to light. Right eye exhibits no discharge. Left eye exhibits no discharge. No scleral icterus.  Neck: Normal range of motion. Neck supple. No tracheal deviation present.  Cardiovascular: Normal heart sounds.  Exam reveals no gallop and no friction rub.   No murmur heard. Tachycardic to 110 bpm, irregularly irregular rhythm  Pulmonary/Chest: Effort normal and breath sounds normal. No respiratory distress. He has no wheezes. He has no rales.  Abdominal: Soft. Bowel sounds are normal. He exhibits no distension and no mass. There is no tenderness. There is no rebound and no  guarding.  Musculoskeletal: Normal range of motion. He exhibits no edema or tenderness.  Neurological: He is alert and oriented to person, place, and time. He exhibits normal muscle tone.  Skin: Skin is warm and dry. No rash noted. He is not diaphoretic.  Psychiatric: He has a normal mood and affect. His behavior is normal. Judgment and thought content normal.    ED Course  Procedures (including critical care time) Labs Review Labs Reviewed  CBC WITH DIFFERENTIAL/PLATELET - Abnormal; Notable for the following:    RBC 4.14 (*)    Hemoglobin 12.6 (*)    HCT 38.3 (*)    Lymphs Abs 0.5 (*)    All other components within normal limits  BASIC METABOLIC PANEL - Abnormal; Notable for the following:    Glucose, Bld 145 (*)    Creatinine, Ser 1.35 (*)    Calcium 8.6 (*)  GFR calc non Af Amer 50 (*)    GFR calc Af Amer 58 (*)    All other components within normal limits    Imaging Review Dg Chest 2 View  10/31/2015  CLINICAL DATA:  Pt [facility] for cough, fever, bodyaches since Friday. Denies n/v/d. EXAM: CHEST - 2 VIEW COMPARISON:  08/27/2014 FINDINGS: Mild cardiomegaly, stable.  Atheromatous aorta.  Lungs are clear. No effusion.  No pneumothorax.  Azygos fissure. Visualized skeletal structures are unremarkable. IMPRESSION: No acute cardiopulmonary disease. Electronically Signed   By: Lucrezia Europe M.D.   On: 10/31/2015 18:45   I have personally reviewed and evaluated these images and lab results as part of my medical decision-making.   EKG Interpretation   Date/Time:  Monday October 31 2015 21:50:14 EDT Ventricular Rate:  91 PR Interval:  204 QRS Duration: 103 QT Interval:  395 QTC Calculation: 486 R Axis:   77 Text Interpretation:  Unknown rhythm, irregular rate Borderline  repolarization abnormality Borderline prolonged QT interval narrow complex  irregular rhythm Artifact T wave abnormality Abnormal ekg Confirmed by  Carmin Muskrat  MD [phone]) on 10/31/2015 10:00:27 PM       MDM   Final diagnoses:  Viral illness   Patient is generally well-appearing. Abdominal exam benign and lungs CTAB. EKG reveals atrial fibrillation, the patient is rate controlled. Chest x-ray with no focal consolidation to suggest pneumonia. Laboratory workup consistent with the patient's known CKD but otherwise unremarkable. Patient was given 1 L of IV fluid in the ED with symptomatic improvement. Doubt serious bacterial illness including meningitis, strep pharyngitis, pneumonia, UTI, intra-abdominal process. Suspect influenza as the cause of his symptoms. He is outside the window for Tamiflu. Recommended discharge home with follow-up with his PCP in 3 days should symptoms persist and return to the emergency department for new or concerning symptoms.    Jenifer Algernon Huxley, MD 10/31/15 [id number]  Carmin Muskrat, MD 11/01/15 [phone]

## 2015-11-16 ENCOUNTER — Other Ambulatory Visit: Payer: Self-pay | Admitting: Family Medicine

## 2015-11-18 ENCOUNTER — Ambulatory Visit: Payer: Medicare Other | Admitting: Family Medicine

## 2015-11-24 ENCOUNTER — Ambulatory Visit (INDEPENDENT_AMBULATORY_CARE_PROVIDER_SITE_OTHER): Payer: Medicare Other | Admitting: Family Medicine

## 2015-11-24 ENCOUNTER — Encounter: Payer: Self-pay | Admitting: Family Medicine

## 2015-11-24 VITALS — BP 132/60 | HR 66 | Temp 97.7°F | Ht 74.0 in | Wt 219.0 lb

## 2015-11-24 DIAGNOSIS — I482 Chronic atrial fibrillation, unspecified: Secondary | ICD-10-CM

## 2015-11-24 DIAGNOSIS — I10 Essential (primary) hypertension: Secondary | ICD-10-CM | POA: Diagnosis not present

## 2015-11-24 LAB — LIPID PANEL
CHOL/HDL RATIO: 3
Cholesterol: 117 mg/dL (ref 0–200)
HDL: 42.8 mg/dL (ref >39.00)
LDL Cholesterol: 56 mg/dL (ref 0–99)
NONHDL: 74.57
Triglycerides: 91 mg/dL (ref 0.0–149.0)
VLDL: 18.2 mg/dL (ref 0.0–40.0)

## 2015-11-24 LAB — COMPREHENSIVE METABOLIC PANEL
ALK PHOS: 74 U/L (ref 39–117)
ALT: 17 U/L (ref 0–53)
AST: 22 U/L (ref 0–37)
Albumin: 4.1 g/dL (ref 3.5–5.2)
BILIRUBIN TOTAL: 0.7 mg/dL (ref 0.2–1.2)
BUN: 24 mg/dL — ABNORMAL HIGH (ref 6–23)
CALCIUM: 9.6 mg/dL (ref 8.4–10.5)
CO2: 29 meq/L (ref 19–32)
CREATININE: 1.35 mg/dL (ref 0.40–1.50)
Chloride: 106 mEq/L (ref 96–112)
GFR: 54.63 mL/min — AB (ref >60.00)
GLUCOSE: 97 mg/dL (ref 70–99)
Potassium: 4.3 mEq/L (ref 3.5–5.1)
Sodium: 141 mEq/L (ref 135–145)
TOTAL PROTEIN: 7.4 g/dL (ref 6.0–8.3)

## 2015-11-24 NOTE — Progress Notes (Signed)
Pre visit review using our clinic review tool, if applicable. No additional management support is needed unless otherwise documented below in the visit note. 

## 2015-11-24 NOTE — Assessment & Plan Note (Signed)
Per cardiology 

## 2015-11-24 NOTE — Progress Notes (Signed)
Patient ID: [name], male    DOB: 11-16-1940  Age: 75 y.o. MRN: [id number]    Subjective:  Subjective HPI AUGUS FEINSTEIN presents for pt here f/u bp and labs.  No complaints.    Review of Systems  Constitutional: Negative for diaphoresis, appetite change, fatigue and unexpected weight change.  Eyes: Negative for pain, redness and visual disturbance.  Respiratory: Negative for cough, chest tightness, shortness of breath and wheezing.   Cardiovascular: Negative for chest pain, palpitations and leg swelling.  Endocrine: Negative for cold intolerance, heat intolerance, polydipsia, polyphagia and polyuria.  Genitourinary: Negative for dysuria, frequency and difficulty urinating.  Neurological: Negative for dizziness, light-headedness, numbness and headaches.    History Past Medical History  Diagnosis Date  . Chronic atrial fibrillation ([place])   . Hypertension   . Erectile dysfunction   . Hypokalemia   . Meningioma ([facility])     Probable 13 x 12 mm meningioma in right frontal region  . Thrombocytopenia ([place])     improved  . Chronic dermatitis   . CKD (chronic kidney disease), stage III     a. Probable based on historical Cr. H/o ARF. Previously saw Dr. Florene Glen.    He has past surgical history that includes Tonsillectomy; Eye surgery; toenail excision; and TEE with cardioversion (12/14/2004).   His family history includes Asthma in his mother; Cancer in his father; Diabetes in his brother, mother, and sister; Heart disease in his mother; Heart failure in his mother; Hypertension in his father and sister.He reports that he quit smoking about 45 years ago. His smoking use included Cigarettes. He has a 10 pack-year smoking history. He does not have any smokeless tobacco history on file. He reports that he does not drink alcohol or use illicit drugs.  Current Outpatient Prescriptions on File Prior to Visit  Medication Sig Dispense Refill  . Ascorbic Acid (VITAMIN C) 1000 MG tablet Take  1,000 mg by mouth daily.    . benazepril (LOTENSIN) 20 MG tablet TAKE ONE TABLET BY MOUTH TWICE DAILY 180 tablet 0  . Cholecalciferol (VITAMIN D-3) 5000 UNITS TABS Take 1 tablet by mouth daily.    . dabigatran (PRADAXA) 150 MG CAPS capsule Take 1 capsule (150 mg total) by mouth every 12 (twelve) hours. 180 capsule 3  . diltiazem (DILACOR XR) 240 MG 24 hr capsule TAKE ONE CAPSULE BY MOUTH ONCE DAILY 90 capsule 1  . furosemide (LASIX) 20 MG tablet Take one tablet daily as needed for swelling. 30 tablet 3  . Garlic 123XX123 MG CAPS Take 1 capsule by mouth daily.    . hydrALAZINE (APRESOLINE) 25 MG tablet Take 1 tablet (25 mg total) by mouth 3 (three) times daily. 270 tablet 3  . KLOR-CON M20 20 MEQ tablet TAKE ONE TABLET BY MOUTH ONCE DAILY 90 tablet 0  . metoprolol (LOPRESSOR) 100 MG tablet Take 1 tablet (100 mg total) by mouth 2 (two) times daily. 180 tablet 3  . Multiple Vitamin (MULTIVITAMIN) tablet Take 1 tablet by mouth daily.    . Omega-3 Fatty Acids (FISH OIL) 1000 MG CAPS Take 1 capsule by mouth daily.     Marland Kitchen Propylene Glycol 0.6 % SOLN Apply 2-3 drops to eye daily as needed (for eyes).    . Saw Palmetto, Serenoa repens, (SAW PALMETTO BERRIES) 540 MG CAPS Take 1,080 mg by mouth daily.      . sildenafil (VIAGRA) 100 MG tablet Take 100 mg by mouth daily as needed for erectile dysfunction.     Marland Kitchen  vitamin B-12 (CYANOCOBALAMIN) 500 MCG tablet Take 500 mcg by mouth daily.     No current facility-administered medications on file prior to visit.     Objective:  Objective Physical Exam  Constitutional: He is oriented to person, place, and time. Vital signs are normal. He appears well-developed and well-nourished. He is sleeping.  HENT:  Head: Normocephalic and atraumatic.  Mouth/Throat: Oropharynx is clear and moist.  Eyes: EOM are normal. Pupils are equal, round, and reactive to light.  Neck: Normal range of motion. Neck supple. No thyromegaly present.  Cardiovascular: Normal rate and regular  rhythm.   No murmur heard. Pulmonary/Chest: Effort normal and breath sounds normal. No respiratory distress. He has no wheezes. He has no rales. He exhibits no tenderness.  Musculoskeletal: He exhibits no edema or tenderness.  Neurological: He is alert and oriented to person, place, and time.  Skin: Skin is warm and dry.  Psychiatric: He has a normal mood and affect. His behavior is normal. Judgment and thought content normal.  Nursing note and vitals reviewed.  BP 132/60 mmHg  Pulse 66  Temp(Src) 97.7 F (36.5 C) (Oral)  Ht 6\' 2"  (1.88 m)  Wt 219 lb (99.338 kg)  BMI 28.11 kg/m2  SpO2 98% Wt Readings from Last 3 Encounters:  11/24/15 219 lb (99.338 kg)  05/20/15 211 lb (95.709 kg)  12/30/14 211 lb 9.6 oz (95.981 kg)     Lab Results  Component Value Date   WBC 5.9 10/31/2015   HGB 12.6* 10/31/2015   HCT 38.3* 10/31/2015   PLT 103* 10/31/2015   GLUCOSE 145* 10/31/2015   CHOL 111 05/20/2015   TRIG 62.0 05/20/2015   HDL 47.50 05/20/2015   LDLCALC 51 05/20/2015   ALT 12 05/20/2015   AST 17 05/20/2015   NA 138 10/31/2015   K 3.5 10/31/2015   CL 104 10/31/2015   CREATININE 1.35* 10/31/2015   BUN 20 10/31/2015   CO2 23 10/31/2015   TSH 1.63 05/20/2015   PSA 3.02 05/20/2015   INR 1.74* 08/27/2014    Dg Chest 2 View  10/31/2015  CLINICAL DATA:  Pt arrived by [facility] for cough, fever, bodyaches since Friday. Denies n/v/d. EXAM: CHEST - 2 VIEW COMPARISON:  08/27/2014 FINDINGS: Mild cardiomegaly, stable.  Atheromatous aorta.  Lungs are clear. No effusion.  No pneumothorax.  Azygos fissure. Visualized skeletal structures are unremarkable. IMPRESSION: No acute cardiopulmonary disease. Electronically Signed   By: Lucrezia Europe M.D.   On: 10/31/2015 18:45     Assessment & Plan:  Plan I am having Mr. Harbour maintain his Fish Oil, sildenafil, Saw Palmetto Berries, multivitamin, vitamin B-12, Vitamin D-3, vitamin C, Garlic, Propylene Glycol, furosemide, dabigatran, metoprolol, hydrALAZINE,  diltiazem, benazepril, and KLOR-CON M20.  No orders of the defined types were placed in this encounter.    Problem List Items Addressed This Visit      Unprioritized   Essential hypertension - Primary    Metoprolol  Stable Pt see cardiology regularly      Relevant Orders   Comprehensive metabolic panel   Lipid panel   Chronic atrial fibrillation [facility])    Per cardiology         Follow-up: Return in about 6 months (around 05/25/2016), or if symptoms worsen or fail to improve, for annual exam, fasting.  Ann Held, DO

## 2015-11-24 NOTE — Assessment & Plan Note (Signed)
Metoprolol  Stable Pt see cardiology regularly

## 2015-11-24 NOTE — Patient Instructions (Signed)
Hypertension Hypertension, commonly called high blood pressure, is when the force of blood pumping through your arteries is too strong. Your arteries are the blood vessels that carry blood from your heart throughout your body. A blood pressure reading consists of a higher number over a lower number, such as 110/72. The higher number (systolic) is the pressure inside your arteries when your heart pumps. The lower number (diastolic) is the pressure inside your arteries when your heart relaxes. Ideally you want your blood pressure below 120/80. Hypertension forces your heart to work harder to pump blood. Your arteries may become narrow or stiff. Having untreated or uncontrolled hypertension can cause heart attack, stroke, kidney disease, and other problems. RISK FACTORS Some risk factors for high blood pressure are controllable. Others are not.  Risk factors you cannot control include:   Race. You may be at higher risk if you are African American.  Age. Risk increases with age.  Gender. Men are at higher risk than women before age 45 years. After age 65, women are at higher risk than men. Risk factors you can control include:  Not getting enough exercise or physical activity.  Being overweight.  Getting too much fat, sugar, calories, or salt in your diet.  Drinking too much alcohol. SIGNS AND SYMPTOMS Hypertension does not usually cause signs or symptoms. Extremely high blood pressure (hypertensive crisis) may cause headache, anxiety, shortness of breath, and nosebleed. DIAGNOSIS To check if you have hypertension, your health care provider will measure your blood pressure while you are seated, with your arm held at the level of your heart. It should be measured at least twice using the same arm. Certain conditions can cause a difference in blood pressure between your right and left arms. A blood pressure reading that is higher than normal on one occasion does not mean that you need treatment. If  it is not clear whether you have high blood pressure, you may be asked to return on a different day to have your blood pressure checked again. Or, you may be asked to monitor your blood pressure at home for 1 or more weeks. TREATMENT Treating high blood pressure includes making lifestyle changes and possibly taking medicine. Living a healthy lifestyle can help lower high blood pressure. You may need to change some of your habits. Lifestyle changes may include:  Following the DASH diet. This diet is high in fruits, vegetables, and whole grains. It is low in salt, red meat, and added sugars.  Keep your sodium intake below 2,300 mg per day.  Getting at least 30-45 minutes of aerobic exercise at least 4 times per week.  Losing weight if necessary.  Not smoking.  Limiting alcoholic beverages.  Learning ways to reduce stress. Your health care provider may prescribe medicine if lifestyle changes are not enough to get your blood pressure under control, and if one of the following is true:  You are 18-59 years of age and your systolic blood pressure is above 140.  You are 60 years of age or older, and your systolic blood pressure is above 150.  Your diastolic blood pressure is above 90.  You have diabetes, and your systolic blood pressure is over 140 or your diastolic blood pressure is over 90.  You have kidney disease and your blood pressure is above 140/90.  You have heart disease and your blood pressure is above 140/90. Your personal target blood pressure may vary depending on your medical conditions, your age, and other factors. HOME CARE INSTRUCTIONS    Have your blood pressure rechecked as directed by your health care provider.   Take medicines only as directed by your health care provider. Follow the directions carefully. Blood pressure medicines must be taken as prescribed. The medicine does not work as well when you skip doses. Skipping doses also puts you at risk for  problems.  Do not smoke.   Monitor your blood pressure at home as directed by your health care provider. SEEK MEDICAL CARE IF:   You think you are having a reaction to medicines taken.  You have recurrent headaches or feel dizzy.  You have swelling in your ankles.  You have trouble with your vision. SEEK IMMEDIATE MEDICAL CARE IF:  You develop a severe headache or confusion.  You have unusual weakness, numbness, or feel faint.  You have severe chest or abdominal pain.  You vomit repeatedly.  You have trouble breathing. MAKE SURE YOU:   Understand these instructions.  Will watch your condition.  Will get help right away if you are not doing well or get worse.   This information is not intended to replace advice given to you by your health care provider. Make sure you discuss any questions you have with your health care provider.   Document Released: 07/23/2005 Document Revised: 12/07/2014 Document Reviewed: 05/15/2013 Elsevier Interactive Patient Education 2016 Elsevier Inc.  

## 2015-12-05 ENCOUNTER — Telehealth: Payer: Self-pay | Admitting: Family Medicine

## 2015-12-05 MED ORDER — DILTIAZEM HCL ER 240 MG PO CP24: 240.0000 mg | ORAL_CAPSULE | Freq: Every day | ORAL | Status: DC

## 2015-12-05 NOTE — Telephone Encounter (Signed)
Can be reached: [phone] Pharmacy: [facility] [place] [place]  Reason for call: Pt called for refill on diltiazem. He has 2 left. Takes 1/day. Pt requesting 90 day supply.

## 2015-12-05 NOTE — Telephone Encounter (Signed)
Rx faxed.    KP 

## 2015-12-08 ENCOUNTER — Other Ambulatory Visit: Payer: Self-pay | Admitting: Family Medicine

## 2016-01-16 ENCOUNTER — Ambulatory Visit (INDEPENDENT_AMBULATORY_CARE_PROVIDER_SITE_OTHER): Payer: Medicare Other | Admitting: Cardiology

## 2016-01-16 ENCOUNTER — Encounter: Payer: Self-pay | Admitting: Cardiology

## 2016-01-16 VITALS — BP 150/90 | HR 79 | Ht 74.0 in | Wt 220.8 lb

## 2016-01-16 DIAGNOSIS — Z7901 Long term (current) use of anticoagulants: Secondary | ICD-10-CM | POA: Diagnosis not present

## 2016-01-16 DIAGNOSIS — I482 Chronic atrial fibrillation, unspecified: Secondary | ICD-10-CM

## 2016-01-16 DIAGNOSIS — N183 Chronic kidney disease, stage 3 unspecified: Secondary | ICD-10-CM

## 2016-01-16 NOTE — Progress Notes (Signed)
[name] Date of Birth: 03/21/1941   History of Present Illness: [name] is seen today for followup of  atrial fibrillation and HTN. He has a history of chronic atrial fibrillation. He failed medical therapy with Sotalol so he has been managed with rate control. On follow up today he feels great. No chest pain, SOB, palpitations, dizziness, or edema. Energy level is good. No bleeding problems.   Current Outpatient Prescriptions on File Prior to Visit  Medication Sig Dispense Refill  . Ascorbic Acid (VITAMIN C) 1000 MG tablet Take 1,000 mg by mouth daily.    . benazepril (LOTENSIN) 20 MG tablet TAKE ONE TABLET BY MOUTH TWICE DAILY 180 tablet 1  . Cholecalciferol (VITAMIN D-3) 5000 UNITS TABS Take 1 tablet by mouth daily.    . dabigatran (PRADAXA) 150 MG CAPS capsule Take 1 capsule (150 mg total) by mouth every 12 (twelve) hours. 180 capsule 3  . diltiazem (DILACOR XR) 240 MG 24 hr capsule Take 1 capsule (240 mg total) by mouth daily. 90 capsule 1  . furosemide (LASIX) 20 MG tablet Take one tablet daily as needed for swelling. 30 tablet 3  . Garlic 123XX123 MG CAPS Take 1 capsule by mouth daily.    . hydrALAZINE (APRESOLINE) 25 MG tablet Take 1 tablet (25 mg total) by mouth 3 (three) times daily. 270 tablet 3  . KLOR-CON M20 20 MEQ tablet TAKE ONE TABLET BY MOUTH ONCE DAILY 90 tablet 0  . metoprolol (LOPRESSOR) 100 MG tablet Take 1 tablet (100 mg total) by mouth 2 (two) times daily. 180 tablet 3  . Multiple Vitamin (MULTIVITAMIN) tablet Take 1 tablet by mouth daily.    . Omega-3 Fatty Acids (FISH OIL) 1000 MG CAPS Take 1 capsule by mouth daily.     Marland Kitchen Propylene Glycol 0.6 % SOLN Apply 2-3 drops to eye daily as needed (for eyes).    . Saw Palmetto, Serenoa repens, (SAW PALMETTO BERRIES) 540 MG CAPS Take 1,080 mg by mouth daily.      . sildenafil (VIAGRA) 100 MG tablet Take 100 mg by mouth daily as needed for erectile dysfunction.     . vitamin B-12 (CYANOCOBALAMIN) 500 MCG tablet Take 500  mcg by mouth daily.     No current facility-administered medications on file prior to visit.    Allergies  Allergen Reactions  . Cipro Iv [Ciprofloxacin]   . Hctz [Hydrochlorothiazide] Photosensitivity    Past Medical History  Diagnosis Date  . Chronic atrial fibrillation ([place])   . Hypertension   . Erectile dysfunction   . Hypokalemia   . Meningioma ([facility])     Probable 13 x 12 mm meningioma in right frontal region  . Thrombocytopenia ([place])     improved  . Chronic dermatitis   . CKD (chronic kidney disease), stage III     a. Probable based on historical Cr. H/o ARF. Previously saw Dr. Florene Glen.    Past Surgical History  Procedure Laterality Date  . Tonsillectomy    . Eye surgery      spot removed from cornea  . Toenail excision      ingrown toenail removal  . Tee with cardioversion  12/14/2004    Successful TEE guided electrical cardioversion. -- showed mild mitral insufficiency and trace pulmonic insufficiency     History  Smoking status  . Former Smoker -- 1.00 packs/day for 10 years  . Types: Cigarettes  . Quit date: 04/25/1970  Smokeless tobacco  . Not on file  History  Alcohol Use No    Family History  Problem Relation Age of Onset  . Heart disease Mother     with pacemaker  . Diabetes Mother   . Asthma Mother   . Hypertension Father   . Cancer Father     pancreatic cancer  . Diabetes Brother   . Hypertension Sister   . Diabetes Sister   . Heart failure Mother     Review of Systems: As noted in history of present illness.  All other systems were reviewed and are negative.  Physical Exam: BP 150/90 mmHg  Pulse 79  Ht 6\' 2"  (1.88 m)  Wt 220 lb 12.8 oz (100.154 kg)  BMI 28.34 kg/m2  SpO2 97% WDWM in NAD.  The HEENT exam is normal. The carotids are 2+ without bruits.  There is no thyromegaly.  There is no JVD.  The lungs are clear.   The heart exam reveals an irregular rate with a normal S1 and S2.  There are no murmurs, gallops, or rubs.  The  PMI is not displaced.   Abdominal exam reveals good bowel sounds. There is no hepatosplenomegaly or tenderness.  There are no masses.  Exam of the legs reveal no clubbing, cyanosis, or edema.   The distal pulses are intact.  Cranial nerves II - XII are intact.  Motor and sensory functions are intact.  The gait is normal.  LABORATORY DATA: Lab Results  Component Value Date   WBC 5.9 10/31/2015   HGB 12.6* 10/31/2015   HCT 38.3* 10/31/2015   PLT 103* 10/31/2015   GLUCOSE 97 11/24/2015   CHOL 117 11/24/2015   TRIG 91.0 11/24/2015   HDL 42.80 11/24/2015   LDLCALC 56 11/24/2015   ALT 17 11/24/2015   AST 22 11/24/2015   NA 141 11/24/2015   K 4.3 11/24/2015   CL 106 11/24/2015   CREATININE 1.35 11/24/2015   BUN 24* 11/24/2015   CO2 29 11/24/2015   TSH 1.63 05/20/2015   PSA 3.02 05/20/2015   INR 1.74* 08/27/2014       Assessment / Plan: 1. Atrial fibrillation. Now permanent. Patient is asymptomatic. His rate is well controlled on diltiazem and metoprolol. Continue Pradaxa.  I would recommend a long-term strategy of rate control and anticoagulation.   2. Hypertension. Blood pressure is elevated today but he reports BP normal at home. Will monitor.   I will follow up in 1 year.

## 2016-01-16 NOTE — Patient Instructions (Addendum)
Continue your current therapy  I will see you in one year   

## 2016-01-23 ENCOUNTER — Other Ambulatory Visit: Payer: Self-pay | Admitting: Cardiology

## 2016-01-24 ENCOUNTER — Telehealth: Payer: Self-pay | Admitting: Cardiology

## 2016-01-24 MED ORDER — DABIGATRAN ETEXILATE MESYLATE 150 MG PO CAPS: 150.0000 mg | ORAL_CAPSULE | Freq: Two times a day (BID) | ORAL | Status: DC

## 2016-01-24 NOTE — Telephone Encounter (Signed)
°*  STAT* If patient is at the pharmacy, call can be transferred to refill team.   1. Which medications need to be refilled? (please list name of each medication and dose if known) Pradaxa 150mg   2. Which pharmacy/location (including street and city if local pharmacy) is medication to be sent to? [facility] on [address]   3. Do they need a 30 day or 90 day supply? [place]

## 2016-01-24 NOTE — Telephone Encounter (Signed)
Returned call to patient.Stated he needed pradaxa refill.Refill sent to pharmacy.

## 2016-01-27 ENCOUNTER — Other Ambulatory Visit: Payer: Self-pay | Admitting: Cardiology

## 2016-01-30 NOTE — Telephone Encounter (Signed)
Rx request sent to pharmacy.  

## 2016-02-12 ENCOUNTER — Other Ambulatory Visit: Payer: Self-pay | Admitting: Family Medicine

## 2016-03-23 ENCOUNTER — Other Ambulatory Visit: Payer: Self-pay

## 2016-03-23 MED ORDER — DABIGATRAN ETEXILATE MESYLATE 150 MG PO CAPS: 150.0000 mg | ORAL_CAPSULE | Freq: Two times a day (BID) | ORAL | 4 refills | Status: DC

## 2016-03-30 ENCOUNTER — Telehealth: Payer: Self-pay | Admitting: Cardiology

## 2016-03-30 NOTE — Telephone Encounter (Signed)
[name] is calling in reference to a form that needed to be sign by  Dr. Martinique so that he can get help with his Pradaxa .  Wants to know is it ready for him to pick up .Marland KitchenPlease call

## 2016-03-30 NOTE — Telephone Encounter (Signed)
Returned call to patient.Patient assistance form signed by Dr.Jordan with a 90 day pradaxa prescription attached to form.Form left at front desk of [facility] office for pick up.

## 2016-04-27 ENCOUNTER — Encounter: Payer: Self-pay | Admitting: Family Medicine

## 2016-05-25 ENCOUNTER — Encounter: Payer: Medicare Other | Admitting: Family Medicine

## 2016-05-30 ENCOUNTER — Other Ambulatory Visit: Payer: Self-pay | Admitting: Family Medicine

## 2016-06-05 ENCOUNTER — Other Ambulatory Visit: Payer: Self-pay | Admitting: Family Medicine

## 2016-06-12 ENCOUNTER — Encounter: Payer: Self-pay | Admitting: Family Medicine

## 2016-06-12 ENCOUNTER — Ambulatory Visit (INDEPENDENT_AMBULATORY_CARE_PROVIDER_SITE_OTHER): Payer: Medicare Other | Admitting: Family Medicine

## 2016-06-12 VITALS — BP 134/80 | HR 71 | Temp 97.8°F | Resp 16 | Ht 76.0 in | Wt 218.8 lb

## 2016-06-12 DIAGNOSIS — I4891 Unspecified atrial fibrillation: Secondary | ICD-10-CM

## 2016-06-12 DIAGNOSIS — Z8639 Personal history of other endocrine, nutritional and metabolic disease: Secondary | ICD-10-CM

## 2016-06-12 DIAGNOSIS — Z23 Encounter for immunization: Secondary | ICD-10-CM | POA: Diagnosis not present

## 2016-06-12 DIAGNOSIS — I10 Essential (primary) hypertension: Secondary | ICD-10-CM | POA: Diagnosis not present

## 2016-06-12 DIAGNOSIS — Z0000 Encounter for general adult medical examination without abnormal findings: Secondary | ICD-10-CM

## 2016-06-12 DIAGNOSIS — N189 Chronic kidney disease, unspecified: Secondary | ICD-10-CM

## 2016-06-12 DIAGNOSIS — Z87898 Personal history of other specified conditions: Secondary | ICD-10-CM

## 2016-06-12 NOTE — Patient Instructions (Signed)

## 2016-06-12 NOTE — Progress Notes (Signed)
Pre visit review using our clinic review tool, if applicable. No additional management support is needed unless otherwise documented below in the visit note. 

## 2016-06-12 NOTE — Progress Notes (Signed)
Subjective:   [name] is a 75 y.o. male who presents for Medicare Annual/Subsequent preventive examination.  Review of Systems:   Review of Systems  Constitutional: Negative for activity change, appetite change and fatigue.  HENT: Negative for hearing loss, congestion, tinnitus and ear discharge.   Eyes: Negative for visual disturbance (see optho q1y) Respiratory: Negative for cough, chest tightness and shortness of breath.   Cardiovascular: Negative for chest pain, palpitations and leg swelling.  Gastrointestinal: Negative for abdominal pain, diarrhea, constipation and abdominal distention.  Genitourinary: Negative for urgency, frequency, decreased urine volume and difficulty urinating.  Musculoskeletal: Negative for back pain, arthralgias and gait problem.  Skin: Negative for color change, pallor and rash.  Neurological: Negative for dizziness, light-headedness, numbness and headaches.  Hematological: Negative for adenopathy. Does not bruise/bleed easily.  Psychiatric/Behavioral: Negative for suicidal ideas, confusion, sleep disturbance, self-injury, dysphoric mood, decreased concentration and agitation.  Pt is able to read and write and can do all ADLs No risk for falling No abuse/ violence in home          Objective:    Vitals: BP 134/80 (BP Location: Right Arm, Patient Position: Sitting, Cuff Size: Large)   Pulse 71   Temp 97.8 F (36.6 C) (Oral)   Resp 16   Ht 6\' 4"  (1.93 m)   Wt 218 lb 12.8 oz (99.2 kg)   SpO2 98%   BMI 26.63 kg/m   Body mass index is 26.63 kg/m.  General appearance: alert, cooperative, appears stated age and no distress Head: Normocephalic, without obvious abnormality, atraumatic Eyes: conjunctivae/corneas clear. PERRL, EOM's intact. Fundi benign. Ears: normal TM's and external ear canals both ears Nose: Nares normal. Septum midline. Mucosa normal. No drainage or sinus tenderness. Throat: lips, mucosa, and tongue normal; teeth and gums  normal Neck: no adenopathy, no carotid bruit, no JVD, supple, symmetrical, trachea midline and thyroid not enlarged, symmetric, no tenderness/mass/nodules Back: symmetric, no curvature. ROM normal. No CVA tenderness. Lungs: clear to auscultation bilaterally Chest wall: no tenderness Heart: regular rate and rhythm, S1, S2 normal, no murmur, click, rub or gallop Abdomen: soft, non-tender; bowel sounds normal; no masses,  no organomegaly Male genitalia: normal, deferred--urology Rectal: deferred--urology Extremities: extremities normal, atraumatic, no cyanosis or edema Pulses: 2+ and symmetric Skin: Skin color, texture, turgor normal. No rashes or lesions Lymph nodes: Cervical, supraclavicular, and axillary nodes normal. Neurologic: Alert and oriented X 3, normal strength and tone. Normal symmetric reflexes. Normal coordination and gait Tobacco History  Smoking Status  . Former Smoker  . Packs/day: 1.00  . Years: 10.00  . Types: Cigarettes  . Quit date: 04/25/1970  Smokeless Tobacco  . Not on file     Counseling given: Not Answered   Past Medical History:  Diagnosis Date  . Chronic atrial fibrillation ([place])   . Chronic dermatitis   . CKD (chronic kidney disease), stage III    a. Probable based on historical Cr. H/o ARF. Previously saw Dr. Florene Glen.  . Erectile dysfunction   . Hypertension   . Hypokalemia   . Meningioma ([facility])    Probable 13 x 12 mm meningioma in right frontal region  . Thrombocytopenia ([place])    improved   Past Surgical History:  Procedure Laterality Date  . EYE SURGERY     spot removed from cornea  . TEE WITH CARDIOVERSION  12/14/2004   Successful TEE guided electrical cardioversion. -- showed mild mitral insufficiency and trace pulmonic insufficiency   . TOENAIL EXCISION  ingrown toenail removal  . TONSILLECTOMY     Family History  Problem Relation Age of Onset  . Heart disease Mother     with pacemaker  . Diabetes Mother   . Asthma Mother   .  Hypertension Father   . Cancer Father     pancreatic cancer  . Diabetes Brother   . Hypertension Sister   . Diabetes Sister   . Heart failure Mother    History  Sexual Activity  . Sexual activity: Not on file    Outpatient Encounter Prescriptions as of 06/12/2016  Medication Sig  . Ascorbic Acid (VITAMIN C) 1000 MG tablet Take 1,000 mg by mouth daily.  . benazepril (LOTENSIN) 20 MG tablet TAKE ONE TABLET BY MOUTH TWICE DAILY  . Cholecalciferol (VITAMIN D-3) 5000 UNITS TABS Take 1 tablet by mouth daily.  . dabigatran (PRADAXA) 150 MG CAPS capsule Take 1 capsule (150 mg total) by mouth every 12 (twelve) hours.  Marland Kitchen diltiazem (DILACOR XR) 240 MG 24 hr capsule TAKE ONE CAPSULE BY MOUTH ONCE DAILY  . furosemide (LASIX) 20 MG tablet Take one tablet daily as needed for swelling.  . Garlic 123XX123 MG CAPS Take 1 capsule by mouth daily.  . hydrALAZINE (APRESOLINE) 25 MG tablet TAKE ONE TABLET BY MOUTH THREE TIMES DAILY  . KLOR-CON M20 20 MEQ tablet TAKE ONE TABLET BY MOUTH ONCE DAILY  . metoprolol (LOPRESSOR) 100 MG tablet TAKE ONE TABLET BY MOUTH TWICE DAILY  . Multiple Vitamin (MULTIVITAMIN) tablet Take 1 tablet by mouth daily.  . Omega-3 Fatty Acids (FISH OIL) 1000 MG CAPS Take 1 capsule by mouth daily.   Marland Kitchen Propylene Glycol 0.6 % SOLN Apply 2-3 drops to eye daily as needed (for eyes).  . Saw Palmetto, Serenoa repens, (SAW PALMETTO BERRIES) 540 MG CAPS Take 1,080 mg by mouth daily.    . sildenafil (VIAGRA) 100 MG tablet Take 100 mg by mouth daily as needed for erectile dysfunction.   . vitamin B-12 (CYANOCOBALAMIN) 500 MCG tablet Take 500 mcg by mouth daily.   No facility-administered encounter medications on file as of 06/12/2016.     Activities of Daily Living In your present state of health, do you have any difficulty performing the following activities: 06/12/2016  Hearing? N  Vision? N  Difficulty concentrating or making decisions? N  Walking or climbing stairs? N  Dressing or bathing?  N  Doing errands, shopping? N  Some recent data might be hidden    Patient Care Team: Ann Held, DO as PCP - General (Family Medicine) Inda Castle, MD as Consulting Physician (Gastroenterology) Peter M Martinique, MD as Consulting Physician (Cardiology) Rana Snare, MD as Consulting Physician (Urology) Darleen Crocker, MD as Consulting Physician (Ophthalmology) Rolm Bookbinder, MD as Consulting Physician (Dermatology)   Assessment:    CPE Exercise Activities and Dietary recommendations Current Exercise Habits: The patient does not participate in regular exercise at present, Exercise limited by: None identified  Goals    None     Fall Risk Fall Risk  06/12/2016 05/20/2015 05/18/2014 11/11/2012  Falls in the past year? Yes No No No  Number falls in past yr: (No Data) - - -  Injury with Fall? No - - -   Depression Screen PHQ 2/9 Scores 06/12/2016 05/20/2015 05/18/2014 11/11/2012  PHQ - 2 Score 0 0 0 0    Cognitive Function MMSE - Mini Mental State Exam 06/12/2016 06/12/2016  Orientation to time 5 5  Orientation to Place 5 -  Registration 3 -  Attention/ Calculation 5 -  Recall 3 -  Language- name 2 objects 2 -  Language- repeat 1 -  Language- follow 3 step command 3 -  Language- read & follow direction 1 -  Write a sentence 1 -  Copy design 1 -  Total score 30 -        Immunization History  Administered Date(s) Administered  . Influenza Split 05/13/2012  . Influenza Whole 04/06/2010  . Influenza,inj,Quad PF,36+ Mos 05/13/2013, 05/18/2014, 05/20/2015  . Pneumococcal Conjugate-13 05/18/2014  . Pneumococcal Polysaccharide-23 05/20/2006  . Zoster 08/09/2008   Screening Tests Health Maintenance  Topic Date Due  . TETANUS/TDAP  11/05/2015  . INFLUENZA VACCINE  03/06/2016  . ZOSTAVAX  Completed  . PNA vac Low Risk Adult  Completed      Plan:    During the course of the visit the patient was educated and counseled about the following appropriate screening and  preventive services:   Vaccines to include Pneumoccal, Influenza, Hepatitis B, Td, Zostavax, HCV  Electrocardiogram  Cardiovascular Disease  Colorectal cancer screening  Diabetes screening  Prostate Cancer Screening  Glaucoma screening  Nutrition counseling   Smoking cessation counseling  Patient Instructions (the written plan) was given to the patient.   1. Atrial fibrillation, unspecified type [facility] Per cardiology - Vitamin B12; Future - PSA; Future - Lipid panel; Future - CBC with Differential/Platelet; Future - Comprehensive metabolic panel; Future  2. Chronic renal failure, unspecified CKD stage  - Vitamin B12; Future - PSA; Future - Lipid panel; Future - CBC with Differential/Platelet; Future - Comprehensive metabolic panel; Future  3. Essential hypertension stable - Vitamin B12; Future - PSA; Future - Lipid panel; Future - CBC with Differential/Platelet; Future - Comprehensive metabolic panel; Future  4. History of elevated PSA Per urology - PSA; Future  5. History of non anemic vitamin B12 deficiency  - Vitamin B12; Future  6. Encounter for Medicare annual wellness exam See above  7. Encounter for immunization   - Flu vaccine HIGH DOSE PF  Ann Held, DO  06/12/2016

## 2016-06-13 ENCOUNTER — Other Ambulatory Visit (INDEPENDENT_AMBULATORY_CARE_PROVIDER_SITE_OTHER): Payer: Medicare Other

## 2016-06-13 DIAGNOSIS — I10 Essential (primary) hypertension: Secondary | ICD-10-CM | POA: Diagnosis not present

## 2016-06-13 DIAGNOSIS — I4891 Unspecified atrial fibrillation: Secondary | ICD-10-CM

## 2016-06-13 DIAGNOSIS — N189 Chronic kidney disease, unspecified: Secondary | ICD-10-CM | POA: Diagnosis not present

## 2016-06-13 DIAGNOSIS — Z87898 Personal history of other specified conditions: Secondary | ICD-10-CM

## 2016-06-13 DIAGNOSIS — Z8639 Personal history of other endocrine, nutritional and metabolic disease: Secondary | ICD-10-CM | POA: Diagnosis not present

## 2016-06-13 LAB — CBC WITH DIFFERENTIAL/PLATELET
Basophils Absolute: 0 10*3/uL (ref 0.0–0.1)
Basophils Relative: 0.5 % (ref 0.0–3.0)
EOS PCT: 2.3 % (ref 0.0–5.0)
Eosinophils Absolute: 0.2 10*3/uL (ref 0.0–0.7)
HEMATOCRIT: 43.6 % (ref 39.0–52.0)
HEMOGLOBIN: 14.7 g/dL (ref 13.0–17.0)
LYMPHS PCT: 13.5 % (ref 12.0–46.0)
Lymphs Abs: 1.1 10*3/uL (ref 0.7–4.0)
MCHC: 33.7 g/dL (ref 30.0–36.0)
MCV: 92.6 fl (ref 78.0–100.0)
Monocytes Absolute: 0.7 10*3/uL (ref 0.1–1.0)
Monocytes Relative: 8.8 % (ref 3.0–12.0)
NEUTROS ABS: 6 10*3/uL (ref 1.4–7.7)
Neutrophils Relative %: 74.9 % (ref 43.0–77.0)
PLATELETS: 177 10*3/uL (ref 150.0–400.0)
RBC: 4.71 Mil/uL (ref 4.22–5.81)
RDW: 14.4 % (ref 11.5–15.5)
WBC: 7.9 10*3/uL (ref 4.0–10.5)

## 2016-06-13 LAB — LIPID PANEL
CHOLESTEROL: 130 mg/dL (ref 0–200)
HDL: 44.9 mg/dL (ref >39.00)
LDL Cholesterol: 61 mg/dL (ref 0–99)
NonHDL: 84.95
TRIGLYCERIDES: 121 mg/dL (ref 0.0–149.0)
Total CHOL/HDL Ratio: 3
VLDL: 24.2 mg/dL (ref 0.0–40.0)

## 2016-06-13 LAB — COMPREHENSIVE METABOLIC PANEL
ALBUMIN: 4.7 g/dL (ref 3.5–5.2)
ALT: 11 U/L (ref 0–53)
AST: 17 U/L (ref 0–37)
Alkaline Phosphatase: 75 U/L (ref 39–117)
BUN: 19 mg/dL (ref 6–23)
CALCIUM: 9.8 mg/dL (ref 8.4–10.5)
CHLORIDE: 103 meq/L (ref 96–112)
CO2: 29 mEq/L (ref 19–32)
CREATININE: 1.28 mg/dL (ref 0.40–1.50)
GFR: 58.01 mL/min — AB (ref >60.00)
Glucose, Bld: 92 mg/dL (ref 70–99)
POTASSIUM: 3.9 meq/L (ref 3.5–5.1)
Sodium: 140 mEq/L (ref 135–145)
Total Bilirubin: 1.5 mg/dL — ABNORMAL HIGH (ref 0.2–1.2)
Total Protein: 7.3 g/dL (ref 6.0–8.3)

## 2016-06-13 LAB — VITAMIN B12: Vitamin B-12: 929 pg/mL — ABNORMAL HIGH (ref 211–911)

## 2016-06-13 LAB — PSA: PSA: 4.61 ng/mL — AB (ref 0.10–4.00)

## 2016-06-21 ENCOUNTER — Other Ambulatory Visit: Payer: Self-pay

## 2016-06-21 MED ORDER — DABIGATRAN ETEXILATE MESYLATE 150 MG PO CAPS: 150.0000 mg | ORAL_CAPSULE | Freq: Two times a day (BID) | ORAL | 3 refills | Status: DC

## 2016-08-08 ENCOUNTER — Other Ambulatory Visit: Payer: Self-pay | Admitting: Family Medicine

## 2016-09-08 ENCOUNTER — Other Ambulatory Visit: Payer: Self-pay | Admitting: Family Medicine

## 2016-10-11 ENCOUNTER — Other Ambulatory Visit: Payer: Self-pay | Admitting: *Deleted

## 2016-10-11 MED ORDER — ZOSTER VAC RECOMB ADJUVANTED 50 MCG/0.5ML IM SUSR: 1.0000 mL | Freq: Once | INTRAMUSCULAR | 1 refills | Status: AC

## 2016-11-26 ENCOUNTER — Other Ambulatory Visit: Payer: Self-pay | Admitting: Family Medicine

## 2016-12-17 ENCOUNTER — Ambulatory Visit: Payer: Medicare Other | Admitting: Family Medicine

## 2017-01-01 ENCOUNTER — Encounter: Payer: Self-pay | Admitting: Family Medicine

## 2017-01-01 ENCOUNTER — Ambulatory Visit (INDEPENDENT_AMBULATORY_CARE_PROVIDER_SITE_OTHER): Payer: Medicare Other | Admitting: Family Medicine

## 2017-01-01 DIAGNOSIS — I10 Essential (primary) hypertension: Secondary | ICD-10-CM | POA: Diagnosis not present

## 2017-01-01 DIAGNOSIS — N183 Chronic kidney disease, stage 3 unspecified: Secondary | ICD-10-CM

## 2017-01-01 DIAGNOSIS — I482 Chronic atrial fibrillation, unspecified: Secondary | ICD-10-CM

## 2017-01-01 NOTE — Assessment & Plan Note (Signed)
Per cardiology 

## 2017-01-01 NOTE — Assessment & Plan Note (Signed)
Per nephrology Check labs today 

## 2017-01-01 NOTE — Progress Notes (Signed)
Patient ID: [name], male   DOB: [birth year], 76 y.o.   MRN: [id number]     Subjective:  I acted as a Education administrator for Dr. Carollee Herter.  Guerry Bruin, [place]   Patient ID: [name], male    DOB: Nov 14, 1940, 76 y.o.   MRN: [id number]  Chief Complaint  Patient presents with  . Hypertension    HPI  Patient is in today for follow up blood pressure.  No complaints.   Patient Care Team: Carollee Herter, Alferd Apa, DO as PCP - General (Family Medicine) Inda Castle, MD as Consulting Physician (Gastroenterology) Martinique, Peter M, MD as Consulting Physician (Cardiology) Rana Snare, MD as Consulting Physician (Urology) Darleen Crocker, MD as Consulting Physician (Ophthalmology) Rolm Bookbinder, MD as Consulting Physician (Dermatology)   Past Medical History:  Diagnosis Date  . Chronic atrial fibrillation ([place])   . Chronic dermatitis   . CKD (chronic kidney disease), stage III    a. Probable based on historical Cr. H/o ARF. Previously saw Dr. Florene Glen.  . Erectile dysfunction   . Hypertension   . Hypokalemia   . Meningioma ([facility])    Probable 13 x 12 mm meningioma in right frontal region  . Thrombocytopenia ([place])    improved    Past Surgical History:  Procedure Laterality Date  . EYE SURGERY     spot removed from cornea  . TEE WITH CARDIOVERSION  12/14/2004   Successful TEE guided electrical cardioversion. -- showed mild mitral insufficiency and trace pulmonic insufficiency   . TOENAIL EXCISION     ingrown toenail removal  . TONSILLECTOMY      Family History  Problem Relation Age of Onset  . Heart disease Mother        with pacemaker  . Diabetes Mother   . Asthma Mother   . Hypertension Father   . Cancer Father        pancreatic cancer  . Diabetes Brother   . Hypertension Sister   . Diabetes Sister   . Heart failure Mother     Social History   Social History  . Marital status: Married    Spouse name: N/A  . Number of children: 2  . Years of education: N/A    Occupational History  . 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 Smoking status: Former Smoker    Packs/day: 1.00    Years: 10.00    Types: Cigarettes    Quit date: 04/25/1970  . Smokeless tobacco: Not on file  . Alcohol use No  . Drug use: No  . Sexual activity: Not on file   Other Topics Concern  . Not on file   Social History Narrative   Keeps sister who is mentally retarded.    Outpatient Medications Prior to Visit  Medication Sig Dispense Refill  . Ascorbic Acid (VITAMIN C) 1000 MG tablet Take 1,000 mg by mouth daily.    . benazepril (LOTENSIN) 20 MG tablet TAKE ONE TABLET BY MOUTH TWICE DAILY 180 tablet 1  . Cholecalciferol (VITAMIN D-3) 5000 UNITS TABS Take 1 tablet by mouth daily.    . dabigatran (PRADAXA) 150 MG CAPS capsule Take 1 capsule (150 mg total) by mouth every 12 (twelve) hours. 180 capsule 3  . diltiazem (DILACOR XR) 240 MG 24 hr capsule TAKE ONE CAPSULE BY MOUTH ONCE DAILY 90 capsule 1  . furosemide (LASIX) 20 MG tablet Take one tablet daily as needed for swelling. 30 tablet 3  . Garlic 2025  MG CAPS Take 1 capsule by mouth daily.    . hydrALAZINE (APRESOLINE) 25 MG tablet TAKE ONE TABLET BY MOUTH THREE TIMES DAILY 270 tablet 3  . KLOR-CON M20 20 MEQ tablet TAKE ONE TABLET BY MOUTH ONCE DAILY 90 tablet 1  . metoprolol (LOPRESSOR) 100 MG tablet TAKE ONE TABLET BY MOUTH TWICE DAILY 180 tablet 3  . Multiple Vitamin (MULTIVITAMIN) tablet Take 1 tablet by mouth daily.    . Omega-3 Fatty Acids (FISH OIL) 1000 MG CAPS Take 1 capsule by mouth daily.     Marland Kitchen Propylene Glycol 0.6 % SOLN Apply 2-3 drops to eye daily as needed (for eyes).    . Saw Palmetto, Serenoa repens, (SAW PALMETTO BERRIES) 540 MG CAPS Take 1,080 mg by mouth daily.      . sildenafil (VIAGRA) 100 MG tablet Take 100 mg by mouth daily as needed for erectile dysfunction.     . vitamin B-12 (CYANOCOBALAMIN) 500 MCG tablet Take 500 mcg by mouth daily.    . benazepril (LOTENSIN) 20 MG  tablet TAKE ONE TABLET BY MOUTH TWICE DAILY 180 tablet 1   No facility-administered medications prior to visit.     Allergies  Allergen Reactions  . Cipro Iv [Ciprofloxacin]   . Hctz [Hydrochlorothiazide] Photosensitivity    Review of Systems  Constitutional: Negative for fever and malaise/fatigue.  HENT: Negative for congestion.   Eyes: Negative for blurred vision.  Respiratory: Negative for cough and shortness of breath.   Cardiovascular: Negative for chest pain, palpitations and leg swelling.  Gastrointestinal: Negative for abdominal pain, blood in stool, nausea and vomiting.  Genitourinary: Negative for dysuria and frequency.  Musculoskeletal: Negative for back pain and falls.  Skin: Negative for rash.  Neurological: Negative for dizziness, loss of consciousness and headaches.  Endo/Heme/Allergies: Negative for environmental allergies.  Psychiatric/Behavioral: Negative for depression. The patient is not nervous/anxious.        Objective:    Physical Exam  Constitutional: He appears well-developed and well-nourished. No distress.  HENT:  Head: Normocephalic and atraumatic.  Eyes: Conjunctivae are normal.  Neck: Normal range of motion. No thyromegaly present.  Cardiovascular: Normal rate.  An irregularly irregular rhythm present.  Pulmonary/Chest: Effort normal. He has no wheezes.  Abdominal: Soft. Bowel sounds are normal. There is no tenderness.  Musculoskeletal: Normal range of motion. He exhibits no edema or deformity.  Neurological: He is alert.  Skin: Skin is warm and dry. He is not diaphoretic.  Psychiatric: He has a normal mood and affect. His behavior is normal. Judgment and thought content normal.  Nursing note and vitals reviewed.   BP 117/72   Pulse (!) 57   Temp 97.6 F (36.4 C) (Oral)   Resp 16   Ht 6\' 4"  (1.93 m)   Wt 217 lb 12.8 oz (98.8 kg)   SpO2 98%   BMI 26.51 kg/m  Wt Readings from Last 3 Encounters:  01/01/17 217 lb 12.8 oz (98.8 kg)    06/12/16 218 lb 12.8 oz (99.2 kg)  01/16/16 220 lb 12.8 oz (100.2 kg)   BP Readings from Last 3 Encounters:  01/01/17 117/72  06/12/16 134/80  01/16/16 (!) 150/90     Immunization History  Administered Date(s) Administered  . Influenza Split 05/13/2012  . Influenza Whole 04/06/2010  . Influenza, High Dose Seasonal PF 06/12/2016  . Influenza,inj,Quad PF,36+ Mos 05/13/2013, 05/18/2014, 05/20/2015  . Pneumococcal Conjugate-13 05/18/2014  . Pneumococcal Polysaccharide-23 05/20/2006  . Zoster 08/09/2008    Health Maintenance  Topic Date  Due  . INFLUENZA VACCINE  03/06/2017  . TETANUS/TDAP  04/25/2026  . PNA vac Low Risk Adult  Completed    Lab Results  Component Value Date   WBC 7.9 06/13/2016   HGB 14.7 06/13/2016   HCT 43.6 06/13/2016   PLT 177.0 06/13/2016   GLUCOSE 92 06/13/2016   CHOL 130 06/13/2016   TRIG 121.0 06/13/2016   HDL 44.90 06/13/2016   LDLCALC 61 06/13/2016   ALT 11 06/13/2016   AST 17 06/13/2016   NA 140 06/13/2016   K 3.9 06/13/2016   CL 103 06/13/2016   CREATININE 1.28 06/13/2016   BUN 19 06/13/2016   CO2 29 06/13/2016   TSH 1.63 05/20/2015   PSA 4.61 (H) 06/13/2016   INR 1.74 (H) 08/27/2014    Lab Results  Component Value Date   TSH 1.63 05/20/2015   Lab Results  Component Value Date   WBC 7.9 06/13/2016   HGB 14.7 06/13/2016   HCT 43.6 06/13/2016   MCV 92.6 06/13/2016   PLT 177.0 06/13/2016   Lab Results  Component Value Date   NA 140 06/13/2016   K 3.9 06/13/2016   CO2 29 06/13/2016   GLUCOSE 92 06/13/2016   BUN 19 06/13/2016   CREATININE 1.28 06/13/2016   BILITOT 1.5 (H) 06/13/2016   ALKPHOS 75 06/13/2016   AST 17 06/13/2016   ALT 11 06/13/2016   PROT 7.3 06/13/2016   ALBUMIN 4.7 06/13/2016   CALCIUM 9.8 06/13/2016   ANIONGAP 11 10/31/2015   GFR 58.01 (L) 06/13/2016   Lab Results  Component Value Date   CHOL 130 06/13/2016   Lab Results  Component Value Date   HDL 44.90 06/13/2016   Lab Results  Component  Value Date   LDLCALC 61 06/13/2016   Lab Results  Component Value Date   TRIG 121.0 06/13/2016   Lab Results  Component Value Date   CHOLHDL 3 06/13/2016   No results found for: HGBA1C       Assessment & Plan:   Problem List Items Addressed This Visit      Unprioritized   Chronic atrial fibrillation [facility])    Per cardiology      CKD (chronic kidney disease), stage III    Per nephrology Check labs today      Essential hypertension    Well controlled, no changes to meds. Encouraged heart healthy diet such as the DASH diet and exercise as tolerated.       Relevant Orders   Comprehensive metabolic panel   Lipid panel      I am having Mr. Barber maintain his Fish Oil, sildenafil, Saw Palmetto Berries, multivitamin, vitamin B-12, Vitamin D-3, vitamin C, Garlic, Propylene Glycol, furosemide, metoprolol tartrate, hydrALAZINE, dabigatran, KLOR-CON M20, benazepril, and diltiazem.  No orders of the defined types were placed in this encounter.   CMA served as Education administrator during this visit. History, Physical and Plan performed by medical provider. Documentation and orders reviewed and attested to.  Ann Held, DO

## 2017-01-01 NOTE — Assessment & Plan Note (Signed)
Well controlled, no changes to meds. Encouraged heart healthy diet such as the DASH diet and exercise as tolerated.  °

## 2017-01-01 NOTE — Patient Instructions (Signed)

## 2017-01-16 NOTE — Progress Notes (Signed)
[name] Date of Birth: 1940/08/14   History of Present Illness: [name] is seen today for followup of  atrial fibrillation and HTN. He has a history of chronic atrial fibrillation. He failed medical therapy with Sotalol so he has been managed with rate control and anticoagulation with Pradaxa.   On follow up today he feels great. No chest pain, SOB, palpitations, dizziness, or edema. Energy level is good. No bleeding problems. He is active in retirement and doing a lot of painting around the house.   Current Outpatient Prescriptions on File Prior to Visit  Medication Sig Dispense Refill  . Ascorbic Acid (VITAMIN C) 1000 MG tablet Take 1,000 mg by mouth daily.    . benazepril (LOTENSIN) 20 MG tablet TAKE ONE TABLET BY MOUTH TWICE DAILY 180 tablet 1  . Cholecalciferol (VITAMIN D-3) 5000 UNITS TABS Take 1 tablet by mouth daily.    . dabigatran (PRADAXA) 150 MG CAPS capsule Take 1 capsule (150 mg total) by mouth every 12 (twelve) hours. 180 capsule 3  . diltiazem (DILACOR XR) 240 MG 24 hr capsule TAKE ONE CAPSULE BY MOUTH ONCE DAILY 90 capsule 1  . furosemide (LASIX) 20 MG tablet Take one tablet daily as needed for swelling. 30 tablet 3  . Garlic 8546 MG CAPS Take 1 capsule by mouth daily.    . hydrALAZINE (APRESOLINE) 25 MG tablet TAKE ONE TABLET BY MOUTH THREE TIMES DAILY 270 tablet 3  . KLOR-CON M20 20 MEQ tablet TAKE ONE TABLET BY MOUTH ONCE DAILY 90 tablet 1  . metoprolol (LOPRESSOR) 100 MG tablet TAKE ONE TABLET BY MOUTH TWICE DAILY 180 tablet 3  . Multiple Vitamin (MULTIVITAMIN) tablet Take 1 tablet by mouth daily.    . Omega-3 Fatty Acids (FISH OIL) 1000 MG CAPS Take 1 capsule by mouth daily.     Marland Kitchen Propylene Glycol 0.6 % SOLN Apply 2-3 drops to eye daily as needed (for eyes).    . Saw Palmetto, Serenoa repens, (SAW PALMETTO BERRIES) 540 MG CAPS Take 1,080 mg by mouth daily.      . sildenafil (VIAGRA) 100 MG tablet Take 100 mg by mouth daily as needed for erectile dysfunction.      . vitamin B-12 (CYANOCOBALAMIN) 500 MCG tablet Take 500 mcg by mouth daily.     No current facility-administered medications on file prior to visit.     Allergies  Allergen Reactions  . Cipro Iv [Ciprofloxacin]   . Hctz [Hydrochlorothiazide] Photosensitivity    Past Medical History:  Diagnosis Date  . Chronic atrial fibrillation ([place])   . Chronic dermatitis   . CKD (chronic kidney disease), stage III    a. Probable based on historical Cr. H/o ARF. Previously saw Dr. Florene Glen.  . Erectile dysfunction   . Hypertension   . Hypokalemia   . Meningioma ([facility])    Probable 13 x 12 mm meningioma in right frontal region  . Thrombocytopenia ([place])    improved    Past Surgical History:  Procedure Laterality Date  . EYE SURGERY     spot removed from cornea  . TEE WITH CARDIOVERSION  12/14/2004   Successful TEE guided electrical cardioversion. -- showed mild mitral insufficiency and trace pulmonic insufficiency   . TOENAIL EXCISION     ingrown toenail removal  . TONSILLECTOMY      History  Smoking Status  . Former Smoker  . Packs/day: 1.00  . Years: 10.00  . Types: Cigarettes  . Quit date: 04/25/1970  Smokeless Tobacco  .  Never Used    History  Alcohol Use No    Family History  Problem Relation Age of Onset  . Heart disease Mother        with pacemaker  . Diabetes Mother   . Asthma Mother   . Hypertension Father   . Cancer Father        pancreatic cancer  . Diabetes Brother   . Hypertension Sister   . Diabetes Sister   . Heart failure Mother     Review of Systems: As noted in history of present illness.  All other systems were reviewed and are negative.  Physical Exam: BP 130/82   Pulse (!) 53   Ht 6\' 2"  (1.88 m)   Wt 213 lb 6.4 oz (96.8 kg)   BMI 27.40 kg/m  WDWM in NAD.  The HEENT exam is normal. The carotids are 2+ without bruits.  There is no thyromegaly.  There is no JVD.  The lungs are clear.   The heart exam reveals an irregular rate with a normal  S1 and S2.  There are no murmurs, gallops, or rubs.  The PMI is not displaced.   Abdominal exam reveals good bowel sounds. There is no hepatosplenomegaly or tenderness.  There are no masses.  Exam of the legs reveal no clubbing, cyanosis, or edema.   The distal pulses are intact.  Cranial nerves II - XII are intact.  Motor and sensory functions are intact.  The gait is normal.  LABORATORY DATA: Lab Results  Component Value Date   WBC 7.9 06/13/2016   HGB 14.7 06/13/2016   HCT 43.6 06/13/2016   PLT 177.0 06/13/2016   GLUCOSE 92 06/13/2016   CHOL 130 06/13/2016   TRIG 121.0 06/13/2016   HDL 44.90 06/13/2016   LDLCALC 61 06/13/2016   ALT 11 06/13/2016   AST 17 06/13/2016   NA 140 06/13/2016   K 3.9 06/13/2016   CL 103 06/13/2016   CREATININE 1.28 06/13/2016   BUN 19 06/13/2016   CO2 29 06/13/2016   TSH 1.63 05/20/2015   PSA 4.61 (H) 06/13/2016   INR 1.74 (H) 08/27/2014    Ecg today shows AFib with rate 53. Mild nonspecific ST abnormality. I have personally reviewed and interpreted this study.    Assessment / Plan: 1. Atrial fibrillation. Now permanent. Patient is asymptomatic. His rate is slow on diltiazem and metoprolol. Recommend reducing metoprolol dose to 50 mg bid. Continue Pradaxa.  We will continue a long-term strategy of rate control and anticoagulation.   2. Hypertension. Blood pressure is well controlled.   3. Lipid status looks excellent so I would not recommend a statin (recommended in guidelines for Afib).   I will follow up in 1 year.

## 2017-01-17 ENCOUNTER — Ambulatory Visit (INDEPENDENT_AMBULATORY_CARE_PROVIDER_SITE_OTHER): Payer: Medicare Other | Admitting: Cardiology

## 2017-01-17 ENCOUNTER — Encounter: Payer: Self-pay | Admitting: Cardiology

## 2017-01-17 VITALS — BP 130/82 | HR 53 | Ht 74.0 in | Wt 213.4 lb

## 2017-01-17 DIAGNOSIS — N183 Chronic kidney disease, stage 3 unspecified: Secondary | ICD-10-CM

## 2017-01-17 DIAGNOSIS — I10 Essential (primary) hypertension: Secondary | ICD-10-CM

## 2017-01-17 DIAGNOSIS — I482 Chronic atrial fibrillation, unspecified: Secondary | ICD-10-CM

## 2017-01-17 DIAGNOSIS — Z7901 Long term (current) use of anticoagulants: Secondary | ICD-10-CM | POA: Diagnosis not present

## 2017-01-17 MED ORDER — METOPROLOL TARTRATE 50 MG PO TABS: 50.0000 mg | ORAL_TABLET | Freq: Two times a day (BID) | ORAL | 3 refills | Status: DC

## 2017-01-17 NOTE — Patient Instructions (Signed)
Reduce metoprolol to 50 mg twice a day.  Continue your other therapy  I will see you in one year.

## 2017-01-31 ENCOUNTER — Other Ambulatory Visit: Payer: Self-pay | Admitting: Cardiology

## 2017-02-04 ENCOUNTER — Other Ambulatory Visit: Payer: Self-pay | Admitting: Family Medicine

## 2017-03-13 ENCOUNTER — Telehealth: Payer: Self-pay | Admitting: Cardiology

## 2017-03-13 MED ORDER — DABIGATRAN ETEXILATE MESYLATE 150 MG PO CAPS: 150.0000 mg | ORAL_CAPSULE | Freq: Two times a day (BID) | ORAL | 3 refills | Status: DC

## 2017-03-13 NOTE — Telephone Encounter (Signed)
Returned call to patient.Spoke to wife she stated patient assistance needs pradaxa refill.Refill faxed to fax # [phone].

## 2017-03-13 NOTE — Telephone Encounter (Signed)
New Message  Pt call requesting to speak with RN to get approval sent over to pharmacy for pt to continue to receive medication pradaxa. Please call back to discuss   Fax # [phone]

## 2017-04-18 ENCOUNTER — Ambulatory Visit: Payer: Medicare Other | Admitting: Family Medicine

## 2017-04-18 ENCOUNTER — Ambulatory Visit: Payer: Medicare Other

## 2017-04-18 DIAGNOSIS — Z23 Encounter for immunization: Secondary | ICD-10-CM

## 2017-05-27 ENCOUNTER — Other Ambulatory Visit: Payer: Self-pay | Admitting: Family Medicine

## 2017-05-28 ENCOUNTER — Telehealth: Payer: Self-pay | Admitting: Cardiology

## 2017-05-28 MED ORDER — DABIGATRAN ETEXILATE MESYLATE 150 MG PO CAPS: 150.0000 mg | ORAL_CAPSULE | Freq: Two times a day (BID) | ORAL | 1 refills | Status: DC

## 2017-05-28 NOTE — Telephone Encounter (Signed)
New message    Patient calling regarding refill. Patient request call back to confirm script sent.    *STAT* If patient is at the pharmacy, call can be transferred to refill team.   1. Which medications need to be refilled? (please list name of each medication and dose if known) dabigatran (PRADAXA) 150 MG CAPS capsule  2. Which pharmacy/location (including street and city if local pharmacy) is medication to be sent to? [facility]  Fax # [phone]...Marland KitchenMarland KitchenMarland Kitchenphone [phone]  3. Do they need a 30 day or 90 day supply? [place]

## 2017-06-03 ENCOUNTER — Other Ambulatory Visit: Payer: Self-pay | Admitting: Family Medicine

## 2017-06-06 ENCOUNTER — Other Ambulatory Visit: Payer: Self-pay | Admitting: Cardiology

## 2017-06-06 MED ORDER — DABIGATRAN ETEXILATE MESYLATE 150 MG PO CAPS: 150.0000 mg | ORAL_CAPSULE | Freq: Two times a day (BID) | ORAL | 1 refills | Status: DC

## 2017-06-06 NOTE — Telephone Encounter (Signed)
New message    Patient calling back from last week 10/23  regarding Pradaxa script being sent to [facility].

## 2017-06-19 ENCOUNTER — Telehealth: Payer: Self-pay | Admitting: *Deleted

## 2017-06-19 NOTE — Telephone Encounter (Signed)
LM for patient to return call to schedule AWV.  If pt would like, we could do awv 06/21/17 @9  prior to PCP appt?

## 2017-06-19 NOTE — Progress Notes (Signed)
Subjective:   [name] is a 76 y.o. male who presents for Medicare Annual/Subsequent preventive examination.  Review of Systems:  No ROS.  Medicare Wellness Visit. Additional risk factors are reflected in the social history.  Cardiac Risk Factors include: advanced age (>44men, >3 women);hypertension;male gender Sleep patterns: Wakes 1-2x/ night. Goes back to sleep easily. Sleeps 7-8 hrs total. Feels well rested Home Safety/Smoke Alarms: Feels safe in home. Smoke alarms in place.    Male:   CCS- last reported as 09/09/08-normal      PSA-  Lab Results  Component Value Date   PSA 4.61 (H) 06/13/2016   PSA 3.02 05/20/2015   PSA 3.27 11/11/2013       Objective:    Vitals: BP 138/80 (BP Location: Left Arm, Patient Position: Sitting, Cuff Size: Normal)   Pulse (!) 59   Ht 6\' 2"  (1.88 m)   Wt 210 lb 12.8 oz (95.6 kg)   SpO2 98%   BMI 27.07 kg/m   Body mass index is 27.07 kg/m.  Tobacco Social History   Tobacco Use  Smoking Status Former Smoker  . Packs/day: 1.00  . Years: 10.00  . Pack years: 10.00  . Types: Cigarettes  . Last attempt to quit: 04/25/1970  . Years since quitting: 47.1  Smokeless Tobacco Never Used     Counseling given: Not Answered   Past Medical History:  Diagnosis Date  . Chronic atrial fibrillation ([place])   . Chronic dermatitis   . CKD (chronic kidney disease), stage III ([place])    a. Probable based on historical Cr. H/o ARF. Previously saw Dr. Florene Glen.  . Erectile dysfunction   . Hypertension   . Hypokalemia   . Meningioma ([facility])    Probable 13 x 12 mm meningioma in right frontal region  . Thrombocytopenia ([place])    improved   Past Surgical History:  Procedure Laterality Date  . EYE SURGERY     spot removed from cornea  . TEE WITH CARDIOVERSION  12/14/2004   Successful TEE guided electrical cardioversion. -- showed mild mitral insufficiency and trace pulmonic insufficiency   . TOENAIL EXCISION     ingrown toenail removal  .  TONSILLECTOMY     Family History  Problem Relation Age of Onset  . Heart disease Mother        with pacemaker  . Diabetes Mother   . Asthma Mother   . Heart failure Mother   . Hypertension Father   . Cancer Father        pancreatic cancer  . Diabetes Brother   . Hypertension Sister   . Diabetes Sister    Social History   Substance and Sexual Activity  Sexual Activity Yes    Outpatient Encounter Medications as of 06/21/2017  Medication Sig  . Ascorbic Acid (VITAMIN C) 1000 MG tablet Take 1,000 mg by mouth daily.  . benazepril (LOTENSIN) 20 MG tablet TAKE ONE TABLET BY MOUTH TWICE DAILY  . Cholecalciferol (VITAMIN D-3) 5000 UNITS TABS Take 1 tablet by mouth daily.  . dabigatran (PRADAXA) 150 MG CAPS capsule Take 1 capsule (150 mg total) by mouth every 12 (twelve) hours.  Marland Kitchen DILT-XR 240 MG 24 hr capsule TAKE 1 CAPSULE BY MOUTH ONCE DAILY  . furosemide (LASIX) 20 MG tablet Take one tablet daily as needed for swelling.  . Garlic 4098 MG CAPS Take 1 capsule by mouth daily.  . hydrALAZINE (APRESOLINE) 25 MG tablet TAKE ONE TABLET BY MOUTH THREE TIMES DAILY  .  KLOR-CON M20 20 MEQ tablet TAKE ONE TABLET BY MOUTH ONCE DAILY  . metoprolol tartrate (LOPRESSOR) 50 MG tablet Take 1 tablet (50 mg total) by mouth 2 (two) times daily.  . Multiple Vitamin (MULTIVITAMIN) tablet Take 1 tablet by mouth daily.  . Omega-3 Fatty Acids (FISH OIL) 1000 MG CAPS Take 1 capsule by mouth daily.   Marland Kitchen Propylene Glycol 0.6 % SOLN Apply 2-3 drops to eye daily as needed (for eyes).  . Saw Palmetto, Serenoa repens, (SAW PALMETTO BERRIES) 540 MG CAPS Take 1,080 mg by mouth daily.    . sildenafil (VIAGRA) 100 MG tablet Take 100 mg by mouth daily as needed for erectile dysfunction.   . vitamin B-12 (CYANOCOBALAMIN) 500 MCG tablet Take 500 mcg by mouth daily.  . [DISCONTINUED] metoprolol (LOPRESSOR) 100 MG tablet TAKE ONE TABLET BY MOUTH TWICE DAILY   No facility-administered encounter medications on file as of  06/21/2017.     Activities of Daily Living In your present state of health, do you have any difficulty performing the following activities: 06/21/2017  Hearing? N  Vision? N  Comment wears glasses for reading. Hs cataract sx and lense implant.  Difficulty concentrating or making decisions? N  Walking or climbing stairs? N  Dressing or bathing? N  Doing errands, shopping? N  Preparing Food and eating ? N  Using the Toilet? N  In the past six months, have you accidently leaked urine? N  Do you have problems with loss of bowel control? N  Managing your Medications? N  Managing your Finances? N  Housekeeping or managing your Housekeeping? N  Some recent data might be hidden    Patient Care Team: Carollee Herter, Alferd Apa, DO as PCP - General (Family Medicine) Inda Castle, MD as Consulting Physician (Gastroenterology) Martinique, Peter M, MD as Consulting Physician (Cardiology) Rana Snare, MD as Consulting Physician (Urology) Darleen Crocker, MD as Consulting Physician (Ophthalmology) Rolm Bookbinder, MD as Consulting Physician (Dermatology)   Assessment:    Physical assessment deferred to PCP.  Exercise Activities and Dietary recommendations Current Exercise Habits: The patient does not participate in regular exercise at present, Exercise limited by: None identified Diet (meal preparation, eat out, water intake, caffeinated beverages, dairy products, fruits and vegetables): in general, a "healthy" diet  , well balanced   Goals    . Weight (lb) < 200 lb (90.7 kg) (pt-stated)     Use portion control.      Fall Risk Fall Risk  06/21/2017 06/12/2016 05/20/2015 05/18/2014 11/11/2012  Falls in the past year? No Yes No No No  Number falls in past yr: - (No Data) - - -  Comment - patient report tripping over something but didn't have injuries - - -  Injury with Fall? - No - - -   Depression Screen PHQ 2/9 Scores 06/21/2017 06/12/2016 05/20/2015 05/18/2014  PHQ - 2 Score 0 0 0 0     Cognitive Function MMSE - Mini Mental State Exam 06/21/2017 06/12/2016 06/12/2016  Orientation to time 5 5 5   Orientation to Place 5 5 -  Registration 3 3 -  Attention/ Calculation 5 5 -  Recall 3 3 -  Language- name 2 objects 2 2 -  Language- repeat 1 1 -  Language- follow 3 step command 3 3 -  Language- read & follow direction 1 1 -  Write a sentence 1 1 -  Copy design 1 1 -  Total score 30 30 -  Immunization History  Administered Date(s) Administered  . Influenza Split 05/13/2012  . Influenza Whole 04/06/2010  . Influenza, High Dose Seasonal PF 06/12/2016, 04/18/2017  . Influenza,inj,Quad PF,6+ Mos 05/13/2013, 05/18/2014, 05/20/2015  . Pneumococcal Conjugate-13 05/18/2014  . Pneumococcal Polysaccharide-23 05/20/2006  . Zoster 08/09/2008   Screening Tests Health Maintenance  Topic Date Due  . TETANUS/TDAP  04/25/2026  . INFLUENZA VACCINE  Completed  . PNA vac Low Risk Adult  Completed      Plan:   Follow up with Dr.Lowne today as scheduled.  Continue to eat heart healthy diet (full of fruits, vegetables, whole grains, lean protein, water--limit salt, fat, and sugar intake) and increase physical activity as tolerated.  Continue doing brain stimulating activities (puzzles, reading, adult coloring books, staying active) to keep memory sharp.   Bring a copy of your living will and/or healthcare power of attorney to your next office visit.   I have personally reviewed and noted the following in the patient's chart:   . Medical and social history . Use of alcohol, tobacco or illicit drugs  . Current medications and supplements . Functional ability and status . Nutritional status . Physical activity . Advanced directives . List of other physicians . Hospitalizations, surgeries, and ER visits in previous 12 months . Vitals . Screenings to include cognitive, depression, and falls . Referrals and appointments  In addition, I have reviewed and discussed  with patient certain preventive protocols, quality metrics, and best practice recommendations. A written personalized care plan for preventive services as well as general preventive health recommendations were provided to patient.     Shela Nevin, [place]  06/21/2017

## 2017-06-21 ENCOUNTER — Encounter: Payer: Self-pay | Admitting: Family Medicine

## 2017-06-21 ENCOUNTER — Ambulatory Visit (INDEPENDENT_AMBULATORY_CARE_PROVIDER_SITE_OTHER): Payer: Medicare Other | Admitting: Family Medicine

## 2017-06-21 VITALS — BP 138/80 | HR 59 | Ht 74.0 in | Wt 210.8 lb

## 2017-06-21 DIAGNOSIS — R972 Elevated prostate specific antigen [PSA]: Secondary | ICD-10-CM | POA: Diagnosis not present

## 2017-06-21 DIAGNOSIS — Z0000 Encounter for general adult medical examination without abnormal findings: Secondary | ICD-10-CM

## 2017-06-21 DIAGNOSIS — I10 Essential (primary) hypertension: Secondary | ICD-10-CM | POA: Diagnosis not present

## 2017-06-21 DIAGNOSIS — I482 Chronic atrial fibrillation, unspecified: Secondary | ICD-10-CM

## 2017-06-21 DIAGNOSIS — N183 Chronic kidney disease, stage 3 unspecified: Secondary | ICD-10-CM

## 2017-06-21 DIAGNOSIS — C4492 Squamous cell carcinoma of skin, unspecified: Secondary | ICD-10-CM

## 2017-06-21 DIAGNOSIS — I480 Paroxysmal atrial fibrillation: Secondary | ICD-10-CM

## 2017-06-21 DIAGNOSIS — IMO0002 Reserved for concepts with insufficient information to code with codable children: Secondary | ICD-10-CM

## 2017-06-21 LAB — POC URINALSYSI DIPSTICK (AUTOMATED)
Bilirubin, UA: NEGATIVE
Blood, UA: NEGATIVE
Glucose, UA: NEGATIVE
Ketones, UA: NEGATIVE
LEUKOCYTES UA: NEGATIVE
Nitrite, UA: NEGATIVE
PH UA: 6 (ref 5.0–8.0)
PROTEIN UA: NEGATIVE
SPEC GRAV UA: 1.025 (ref 1.010–1.025)
UROBILINOGEN UA: 0.2 U/dL

## 2017-06-21 LAB — COMPREHENSIVE METABOLIC PANEL
ALBUMIN: 4.6 g/dL (ref 3.5–5.2)
ALK PHOS: 79 U/L (ref 39–117)
ALT: 13 U/L (ref 0–53)
AST: 20 U/L (ref 0–37)
BUN: 24 mg/dL — ABNORMAL HIGH (ref 6–23)
CALCIUM: 9.9 mg/dL (ref 8.4–10.5)
CHLORIDE: 106 meq/L (ref 96–112)
CO2: 30 mEq/L (ref 19–32)
Creatinine, Ser: 1.24 mg/dL (ref 0.40–1.50)
GFR: 60.01 mL/min (ref >60.00)
Glucose, Bld: 104 mg/dL — ABNORMAL HIGH (ref 70–99)
POTASSIUM: 4.7 meq/L (ref 3.5–5.1)
Sodium: 144 mEq/L (ref 135–145)
Total Bilirubin: 1 mg/dL (ref 0.2–1.2)
Total Protein: 7.2 g/dL (ref 6.0–8.3)

## 2017-06-21 LAB — CBC WITH DIFFERENTIAL/PLATELET
BASOS PCT: 0.9 % (ref 0.0–3.0)
Basophils Absolute: 0.1 10*3/uL (ref 0.0–0.1)
EOS ABS: 0.1 10*3/uL (ref 0.0–0.7)
EOS PCT: 0.9 % (ref 0.0–5.0)
HEMATOCRIT: 44.7 % (ref 39.0–52.0)
Hemoglobin: 14.8 g/dL (ref 13.0–17.0)
LYMPHS PCT: 26.5 % (ref 12.0–46.0)
Lymphs Abs: 1.6 10*3/uL (ref 0.7–4.0)
MCHC: 33.1 g/dL (ref 30.0–36.0)
MCV: 95.3 fl (ref 78.0–100.0)
MONOS PCT: 9.1 % (ref 3.0–12.0)
Monocytes Absolute: 0.5 10*3/uL (ref 0.1–1.0)
NEUTROS ABS: 3.7 10*3/uL (ref 1.4–7.7)
Neutrophils Relative %: 62.6 % (ref 43.0–77.0)
PLATELETS: 161 10*3/uL (ref 150.0–400.0)
RBC: 4.69 Mil/uL (ref 4.22–5.81)
RDW: 14.2 % (ref 11.5–15.5)
WBC: 6 10*3/uL (ref 4.0–10.5)

## 2017-06-21 LAB — LIPID PANEL
Cholesterol: 123 mg/dL (ref 0–200)
HDL: 48.8 mg/dL (ref >39.00)
LDL Cholesterol: 62 mg/dL (ref 0–99)
NonHDL: 74.59
Total CHOL/HDL Ratio: 3
Triglycerides: 65 mg/dL (ref 0.0–149.0)
VLDL: 13 mg/dL (ref 0.0–40.0)

## 2017-06-21 LAB — PSA: PSA: 5.16 ng/mL — ABNORMAL HIGH (ref 0.10–4.00)

## 2017-06-21 NOTE — Assessment & Plan Note (Signed)
ghm utd Check labs  See avs  

## 2017-06-21 NOTE — Assessment & Plan Note (Signed)
Well controlled, no changes to meds. Encouraged heart healthy diet such as the DASH diet and exercise as tolerated.  °

## 2017-06-21 NOTE — Progress Notes (Signed)
Patient ID: [name], male    DOB: 05-Aug-1941  Age: 76 y.o. MRN: [id number]    Subjective:  Subjective  HPI [name] presents for cpe and labs .  No complaints.    Review of Systems  Constitutional: Negative.   HENT: Negative for congestion, ear pain, hearing loss, nosebleeds, postnasal drip, rhinorrhea, sinus pressure, sneezing and tinnitus.   Eyes: Negative for photophobia, discharge, itching and visual disturbance.  Respiratory: Negative.   Cardiovascular: Negative.   Gastrointestinal: Negative for abdominal distention, abdominal pain, anal bleeding, blood in stool and constipation.  Endocrine: Negative.   Genitourinary: Negative.   Musculoskeletal: Negative.   Skin: Negative.   Allergic/Immunologic: Negative.   Neurological: Negative for dizziness, weakness, light-headedness, numbness and headaches.  Psychiatric/Behavioral: Negative for agitation, confusion, decreased concentration, dysphoric mood, sleep disturbance and suicidal ideas. The patient is not nervous/anxious.     History Past Medical History:  Diagnosis Date  . Chronic atrial fibrillation ([place])   . Chronic dermatitis   . CKD (chronic kidney disease), stage III ([place])    a. Probable based on historical Cr. H/o ARF. Previously saw Dr. Florene Glen.  . Erectile dysfunction   . Hypertension   . Hypokalemia   . Meningioma ([facility])    Probable 13 x 12 mm meningioma in right frontal region  . Thrombocytopenia ([place])    improved    He has a past surgical history that includes Tonsillectomy; Eye surgery; toenail excision; and TEE with cardioversion (12/14/2004).   His family history includes Asthma in his mother; Cancer in his father; Diabetes in his brother, mother, and sister; Heart disease in his mother; Heart failure in his mother; Hypertension in his father and sister.He reports that he quit smoking about 47 years ago. His smoking use included cigarettes. He has a 10.00 pack-year smoking history. he has never used  smokeless tobacco. He reports that he does not drink alcohol or use drugs.  Current Outpatient Medications on File Prior to Visit  Medication Sig Dispense Refill  . Ascorbic Acid (VITAMIN C) 1000 MG tablet Take 1,000 mg by mouth daily.    . benazepril (LOTENSIN) 20 MG tablet TAKE ONE TABLET BY MOUTH TWICE DAILY 180 tablet 1  . Cholecalciferol (VITAMIN D-3) 5000 UNITS TABS Take 1 tablet by mouth daily.    . dabigatran (PRADAXA) 150 MG CAPS capsule Take 1 capsule (150 mg total) by mouth every 12 (twelve) hours. 180 capsule 1  . DILT-XR 240 MG 24 hr capsule TAKE 1 CAPSULE BY MOUTH ONCE DAILY 90 capsule 1  . furosemide (LASIX) 20 MG tablet Take one tablet daily as needed for swelling. 30 tablet 3  . Garlic 8466 MG CAPS Take 1 capsule by mouth daily.    . hydrALAZINE (APRESOLINE) 25 MG tablet TAKE ONE TABLET BY MOUTH THREE TIMES DAILY 270 tablet 3  . KLOR-CON M20 20 MEQ tablet TAKE ONE TABLET BY MOUTH ONCE DAILY 90 tablet 1  . metoprolol tartrate (LOPRESSOR) 50 MG tablet Take 1 tablet (50 mg total) by mouth 2 (two) times daily. 180 tablet 3  . Multiple Vitamin (MULTIVITAMIN) tablet Take 1 tablet by mouth daily.    . Omega-3 Fatty Acids (FISH OIL) 1000 MG CAPS Take 1 capsule by mouth daily.     Marland Kitchen Propylene Glycol 0.6 % SOLN Apply 2-3 drops to eye daily as needed (for eyes).    . Saw Palmetto, Serenoa repens, (SAW PALMETTO BERRIES) 540 MG CAPS Take 1,080 mg by mouth daily.      Marland Kitchen  sildenafil (VIAGRA) 100 MG tablet Take 100 mg by mouth daily as needed for erectile dysfunction.     . vitamin B-12 (CYANOCOBALAMIN) 500 MCG tablet Take 500 mcg by mouth daily.     No current facility-administered medications on file prior to visit.      Objective:  Objective  Physical Exam  Constitutional: He is oriented to person, place, and time. He appears well-developed and well-nourished. No distress.  HENT:  Head: Normocephalic and atraumatic.  Right Ear: External ear normal.  Left Ear: External ear normal.    Nose: Nose normal.  Mouth/Throat: Oropharynx is clear and moist. No oropharyngeal exudate.  Eyes: Conjunctivae and EOM are normal. Pupils are equal, round, and reactive to light. Right eye exhibits no discharge. Left eye exhibits no discharge.  Neck: Normal range of motion. Neck supple. No JVD present. No thyromegaly present.  Cardiovascular: Normal rate, regular rhythm and intact distal pulses. Exam reveals no gallop and no friction rub.  No murmur heard. Pulmonary/Chest: Effort normal and breath sounds normal. No respiratory distress. He has no wheezes. He has no rales. He exhibits no tenderness.  Abdominal: Soft. Bowel sounds are normal. He exhibits no distension and no mass. There is no tenderness. There is no rebound and no guarding.  Genitourinary: Rectum normal, prostate normal and penis normal. Rectal exam shows guaiac negative stool.  Musculoskeletal: Normal range of motion. He exhibits no edema or tenderness.  Lymphadenopathy:    He has no cervical adenopathy.  Neurological: He is alert and oriented to person, place, and time. He displays normal reflexes. He exhibits normal muscle tone.  Skin: Skin is warm and dry. No rash noted. He is not diaphoretic. No erythema. No pallor.  Psychiatric: He has a normal mood and affect. His behavior is normal. Judgment and thought content normal.  Nursing note and vitals reviewed.  BP 138/80 (BP Location: Left Arm, Patient Position: Sitting, Cuff Size: Normal)   Pulse (!) 59   Ht 6\' 2"  (1.88 m)   Wt 210 lb 12.8 oz (95.6 kg)   SpO2 98%   BMI 27.07 kg/m  Wt Readings from Last 3 Encounters:  06/21/17 210 lb 12.8 oz (95.6 kg)  01/17/17 213 lb 6.4 oz (96.8 kg)  01/01/17 217 lb 12.8 oz (98.8 kg)     Lab Results  Component Value Date   WBC 6.0 06/21/2017   HGB 14.8 06/21/2017   HCT 44.7 06/21/2017   PLT 161.0 06/21/2017   GLUCOSE 104 (H) 06/21/2017   CHOL 123 06/21/2017   TRIG 65.0 06/21/2017   HDL 48.80 06/21/2017   LDLCALC 62  06/21/2017   ALT 13 06/21/2017   AST 20 06/21/2017   NA 144 06/21/2017   K 4.7 06/21/2017   CL 106 06/21/2017   CREATININE 1.24 06/21/2017   BUN 24 (H) 06/21/2017   CO2 30 06/21/2017   TSH 1.63 05/20/2015   PSA 5.16 (H) 06/21/2017   INR 1.74 (H) 08/27/2014    Dg Chest 2 View  Result Date: 10/31/2015 CLINICAL DATA:  Pt [facility] by [facility] for cough, fever, bodyaches since Friday. Denies n/v/d. EXAM: CHEST - 2 VIEW COMPARISON:  08/27/2014 FINDINGS: Mild cardiomegaly, stable.  Atheromatous aorta.  Lungs are clear. No effusion.  No pneumothorax.  Azygos fissure. Visualized skeletal structures are unremarkable. IMPRESSION: No acute cardiopulmonary disease. Electronically Signed   By: Lucrezia Europe M.D.   On: 10/31/2015 18:45     Assessment & Plan:  Plan  I am having Zakariyah R. Terral maintain  his Fish Oil, sildenafil, Saw Palmetto Berries, multivitamin, vitamin B-12, Vitamin D-3, vitamin C, Garlic, Propylene Glycol, furosemide, metoprolol tartrate, hydrALAZINE, KLOR-CON M20, DILT-XR, benazepril, and dabigatran.  No orders of the defined types were placed in this encounter.   Problem List Items Addressed This Visit      Unprioritized   Chronic atrial fibrillation [facility])    Per cardiology      Elevated PSA   Essential hypertension    Well controlled, no changes to meds. Encouraged heart healthy diet such as the DASH diet and exercise as tolerated.       Relevant Orders   Lipid panel (Completed)   CBC with Differential/Platelet (Completed)   PSA (Completed)   Comprehensive metabolic panel (Completed)   POCT Urinalysis Dipstick (Automated) (Completed)   Preventative health care    ghm utd Check labs See avs      Squamous cell carcinoma    Pt sees derm annually       Other Visit Diagnoses    Encounter for Medicare annual wellness exam    -  Primary   Paroxysmal atrial fibrillation ([facility])       Relevant Orders   Lipid panel (Completed)   CBC with Differential/Platelet  (Completed)   PSA (Completed)   Comprehensive metabolic panel (Completed)   POCT Urinalysis Dipstick (Automated) (Completed)   CKD (chronic kidney disease) stage 3, GFR 30-59 ml/min (HCC)       Relevant Orders   Lipid panel (Completed)   CBC with Differential/Platelet (Completed)   PSA (Completed)   Comprehensive metabolic panel (Completed)   POCT Urinalysis Dipstick (Automated) (Completed)      Follow-up: Return in about 6 months (around 12/19/2017), or if symptoms worsen or fail to improve.  Ann Held, DO

## 2017-06-21 NOTE — Patient Instructions (Signed)
[name] , Thank you for taking time to come for your Medicare Wellness Visit. I appreciate your ongoing commitment to your health goals. Please review the following plan we discussed and let me know if I can assist you in the future.   These are the goals we discussed: Goals    . Weight (lb) < 200 lb (90.7 kg) (pt-stated)     Use portion control.       This is a list of the screening recommended for you and due dates:  Health Maintenance  Topic Date Due  . Tetanus Vaccine  04/25/2026  . Flu Shot  Completed  . Pneumonia vaccines  Completed   Continue to eat heart healthy diet (full of fruits, vegetables, whole grains, lean protein, water--limit salt, fat, and sugar intake) and increase physical activity as tolerated.  Continue doing brain stimulating activities (puzzles, reading, adult coloring books, staying active) to keep memory sharp.   Bring a copy of your living will and/or healthcare power of attorney to your next office visit.   Health Maintenance, Male A healthy lifestyle and preventive care is important for your health and wellness. Ask your health care provider about what schedule of regular examinations is right for you. What should I know about weight and diet? Eat a Healthy Diet  Eat plenty of vegetables, fruits, whole grains, low-fat dairy products, and lean protein.  Do not eat a lot of foods high in solid fats, added sugars, or salt.  Maintain a Healthy Weight Regular exercise can help you achieve or maintain a healthy weight. You should:  Do at least 150 minutes of exercise each week. The exercise should increase your heart rate and make you sweat (moderate-intensity exercise).  Do strength-training exercises at least twice a week.  Watch Your Levels of Cholesterol and Blood Lipids  Have your blood tested for lipids and cholesterol every 5 years starting at 77 years of age. If you are at high risk for heart disease, you should start having your blood  tested when you are 77 years old. You may need to have your cholesterol levels checked more often if: ? Your lipid or cholesterol levels are high. ? You are older than 77 years of age. ? You are at high risk for heart disease.  What should I know about cancer screening? Many types of cancers can be detected early and may often be prevented. Lung Cancer  You should be screened every year for lung cancer if: ? You are a current smoker who has smoked for at least 30 years. ? You are a former smoker who has quit within the past 15 years.  Talk to your health care provider about your screening options, when you should start screening, and how often you should be screened.  Colorectal Cancer  Routine colorectal cancer screening usually begins at 77 years of age and should be repeated every 5-10 years until you are 77 years old. You may need to be screened more often if early forms of precancerous polyps or small growths are found. Your health care provider may recommend screening at an earlier age if you have risk factors for colon cancer.  Your health care provider may recommend using home test kits to check for hidden blood in the stool.  A small camera at the end of a tube can be used to examine your colon (sigmoidoscopy or colonoscopy). This checks for the earliest forms of colorectal cancer.  Prostate and Testicular Cancer  Depending on your  age and overall health, your health care provider may do certain tests to screen for prostate and testicular cancer.  Talk to your health care provider about any symptoms or concerns you have about testicular or prostate cancer.  Skin Cancer  Check your skin from head to toe regularly.  Tell your health care provider about any new moles or changes in moles, especially if: ? There is a change in a mole's size, shape, or color. ? You have a mole that is larger than a pencil eraser.  Always use sunscreen. Apply sunscreen liberally and repeat  throughout the day.  Protect yourself by wearing long sleeves, pants, a wide-brimmed hat, and sunglasses when outside.  What should I know about heart disease, diabetes, and high blood pressure?  If you are 86-77 years of age, have your blood pressure checked every 3-5 years. If you are 50 years of age or older, have your blood pressure checked every year. You should have your blood pressure measured twice-once when you are at a hospital or clinic, and once when you are not at a hospital or clinic. Record the average of the two measurements. To check your blood pressure when you are not at a hospital or clinic, you can use: ? An automated blood pressure machine at a pharmacy. ? A home blood pressure monitor.  Talk to your health care provider about your target blood pressure.  If you are between 53-76 years old, ask your health care provider if you should take aspirin to prevent heart disease.  Have regular diabetes screenings by checking your fasting blood sugar level. ? If you are at a normal weight and have a low risk for diabetes, have this test once every three years after the age of 42. ? If you are overweight and have a high risk for diabetes, consider being tested at a younger age or more often.  A one-time screening for abdominal aortic aneurysm (AAA) by ultrasound is recommended for men aged 36-75 years who are current or former smokers. What should I know about preventing infection? Hepatitis B If you have a higher risk for hepatitis B, you should be screened for this virus. Talk with your health care provider to find out if you are at risk for hepatitis B infection. Hepatitis C Blood testing is recommended for:  Everyone born from [birth year] through 1966.  Anyone with known risk factors for hepatitis C.  Sexually Transmitted Diseases (STDs)  You should be screened each year for STDs including gonorrhea and chlamydia if: ? You are sexually active and are younger than 77 years of  age. ? You are older than 77 years of age and your health care provider tells you that you are at risk for this type of infection. ? Your sexual activity has changed since you were last screened and you are at an increased risk for chlamydia or gonorrhea. Ask your health care provider if you are at risk.  Talk with your health care provider about whether you are at high risk of being infected with HIV. Your health care provider may recommend a prescription medicine to help prevent HIV infection.  What else can I do?  Schedule regular health, dental, and eye exams.  Stay current with your vaccines (immunizations).  Do not use any tobacco products, such as cigarettes, chewing tobacco, and e-cigarettes. If you need help quitting, ask your health care provider.  Limit alcohol intake to no more than 2 drinks per day. One drink equals 12 ounces  of beer, 5 ounces of wine, or 1 ounces of hard liquor.  Do not use street drugs.  Do not share needles.  Ask your health care provider for help if you need support or information about quitting drugs.  Tell your health care provider if you often feel depressed.  Tell your health care provider if you have ever been abused or do not feel safe at home. This information is not intended to replace advice given to you by your health care provider. Make sure you discuss any questions you have with your health care provider. Document Released: 01/19/2008 Document Revised: 03/21/2016 Document Reviewed: 04/26/2015 Elsevier Interactive Patient Education  [employer].

## 2017-06-21 NOTE — Assessment & Plan Note (Signed)
Pt sees derm annually

## 2017-06-21 NOTE — Assessment & Plan Note (Signed)
Per cardiology 

## 2017-06-24 ENCOUNTER — Telehealth: Payer: Self-pay | Admitting: Family Medicine

## 2017-06-24 NOTE — Telephone Encounter (Signed)
Pt says that he have a referral to [facility] w/ Dr. Baruch Gouty. Pt says that they are requesting his most recent lab results that includes his PSA due to them being elevated. [facility] would like to have a copy for referral apt.

## 2017-06-25 ENCOUNTER — Other Ambulatory Visit: Payer: Self-pay

## 2017-06-25 ENCOUNTER — Telehealth: Payer: Self-pay

## 2017-06-25 ENCOUNTER — Encounter: Payer: Self-pay | Admitting: *Deleted

## 2017-06-25 MED ORDER — DABIGATRAN ETEXILATE MESYLATE 150 MG PO CAPS: 150.0000 mg | ORAL_CAPSULE | Freq: Two times a day (BID) | ORAL | 3 refills | Status: DC

## 2017-06-25 NOTE — Telephone Encounter (Signed)
Labs faxed

## 2017-06-25 NOTE — Telephone Encounter (Signed)
Patient notified

## 2017-06-25 NOTE — Telephone Encounter (Signed)
Received form form patient for patient assistance for pradaxa.90 day written pradaxa prescription and signed form left at front desk for pick up.

## 2017-07-01 ENCOUNTER — Encounter: Payer: Self-pay | Admitting: *Deleted

## 2017-07-04 ENCOUNTER — Ambulatory Visit: Payer: Medicare Other | Admitting: Nurse Practitioner

## 2017-07-04 ENCOUNTER — Encounter: Payer: Self-pay | Admitting: Nurse Practitioner

## 2017-07-04 VITALS — BP 122/84 | HR 75 | Temp 97.9°F | Ht 74.0 in | Wt 208.0 lb

## 2017-07-04 DIAGNOSIS — R972 Elevated prostate specific antigen [PSA]: Secondary | ICD-10-CM | POA: Diagnosis not present

## 2017-07-04 DIAGNOSIS — I10 Essential (primary) hypertension: Secondary | ICD-10-CM | POA: Diagnosis not present

## 2017-07-04 DIAGNOSIS — I482 Chronic atrial fibrillation, unspecified: Secondary | ICD-10-CM

## 2017-07-04 NOTE — Progress Notes (Signed)
Subjective:  Patient ID: [name], male    DOB: 1940/08/26  Age: 76 y.o. MRN: [id number]  CC: Establish Care (est care--transfer from Dr. Etter Sjogren)  [place] by Wife. Transferring care from Dr. Etter Sjogren to me.  HPI  HTN: Stable with benazepril and hydralazine. Managed by Dr. Martinique. BP Readings from Last 3 Encounters:  07/04/17 122/84  06/21/17 138/80  01/17/17 130/82   A-fib with chronic anticoagulation use: Stable rate with cardizem and metoprolol,  Persistent A-fib rhythm. Current use of pradaxa, denies any signs of GI bleed or hematoma or bruises Managed by Dr. Martinique. Last seen 01/17/2017.  Squamous cell carcinoma: Use of suncreen, hat and adequate coverage with clothing. Followed by Dermatologist: Sherril Croon. Seen once a year  Elevated PSA: Reports BPH Denies any LUTS Current use of flomax and saw palmetto. followed by urology once a year.  Past Medical History:  Diagnosis Date  . Chronic atrial fibrillation ([place])   . Chronic dermatitis   . CKD (chronic kidney disease), stage III ([place])    a. Probable based on historical Cr. H/o ARF. Previously saw Dr. Florene Glen.  . Erectile dysfunction   . Hypertension   . Hypokalemia   . Meningioma ([facility])    Probable 13 x 12 mm meningioma in right frontal region  . Thrombocytopenia ([place])    improved   Outpatient Medications Prior to Visit  Medication Sig Dispense Refill  . Ascorbic Acid (VITAMIN C) 1000 MG tablet Take 1,000 mg by mouth daily.    . benazepril (LOTENSIN) 20 MG tablet TAKE ONE TABLET BY MOUTH TWICE DAILY 180 tablet 1  . Cholecalciferol (VITAMIN D-3) 5000 UNITS TABS Take 1 tablet by mouth daily.    . dabigatran (PRADAXA) 150 MG CAPS capsule Take 1 capsule (150 mg total) by mouth every 12 (twelve) hours. 180 capsule 3  . DILT-XR 240 MG 24 hr capsule TAKE 1 CAPSULE BY MOUTH ONCE DAILY 90 capsule 1  . furosemide (LASIX) 20 MG tablet Take one tablet daily as needed for swelling. 30 tablet 3  . Garlic 7124  MG CAPS Take 1 capsule by mouth daily.    . hydrALAZINE (APRESOLINE) 25 MG tablet TAKE ONE TABLET BY MOUTH THREE TIMES DAILY 270 tablet 3  . KLOR-CON M20 20 MEQ tablet TAKE ONE TABLET BY MOUTH ONCE DAILY 90 tablet 1  . metoprolol tartrate (LOPRESSOR) 50 MG tablet Take 1 tablet (50 mg total) by mouth 2 (two) times daily. 180 tablet 3  . Multiple Vitamin (MULTIVITAMIN) tablet Take 1 tablet by mouth daily.    . Omega-3 Fatty Acids (FISH OIL) 1000 MG CAPS Take 1 capsule by mouth daily.     Marland Kitchen Propylene Glycol 0.6 % SOLN Apply 2-3 drops to eye daily as needed (for eyes).    . sildenafil (VIAGRA) 100 MG tablet Take 100 mg by mouth daily as needed for erectile dysfunction.     . tamsulosin (FLOMAX) 0.4 MG CAPS capsule   0  . vitamin B-12 (CYANOCOBALAMIN) 500 MCG tablet Take 500 mcg by mouth daily.    . Saw Palmetto, Serenoa repens, (SAW PALMETTO BERRIES) 540 MG CAPS Take 1,080 mg by mouth daily.       No facility-administered medications prior to visit.    Social History   Socioeconomic History  . Marital status: Married    Spouse name: Not on file  . Number of children: 2  . Years of education: Not on file  . Highest education level: Not on file  Social  Needs  . Financial resource strain: Not on file  . Food insecurity - worry: Not on file  . Food insecurity - inability: Not on file  . Transportation needs - medical: Not on file  . Transportation needs - non-medical: Not on file  Occupational History  . Occupation: managed salvage [employer]    Employer: RETIRED  Tobacco Use  . Smoking status: Former Smoker    Packs/day: 1.00    Years: 10.00    Pack years: 10.00    Types: Cigarettes    Last attempt to quit: 04/25/1970    Years since quitting: 47.2  . Smokeless tobacco: Never Used  Substance and Sexual Activity  . Alcohol use: No  . Drug use: No  . Sexual activity: Yes  Other Topics Concern  . Not on file  Social History Narrative   Keeps sister who is mentally retarded.    ROS Review of Systems  Constitutional: Negative for diaphoresis and malaise/fatigue.  Respiratory: Negative for cough and sputum production.   Cardiovascular: Negative for chest pain, palpitations, orthopnea, leg swelling and PND.  Gastrointestinal: Negative for abdominal pain, blood in stool, heartburn, melena, nausea and vomiting.  Musculoskeletal: Negative for falls.  Skin: Negative.   Neurological: Negative for dizziness and weakness.  Psychiatric/Behavioral: Negative for memory loss.   Objective:  BP 122/84   Pulse 75   Temp 97.9 F (36.6 C)   Ht 6\' 2"  (1.88 m)   Wt 208 lb (94.3 kg)   SpO2 97%   BMI 26.71 kg/m   BP Readings from Last 3 Encounters:  07/04/17 122/84  06/21/17 138/80  01/17/17 130/82    Wt Readings from Last 3 Encounters:  07/04/17 208 lb (94.3 kg)  06/21/17 210 lb 12.8 oz (95.6 kg)  01/17/17 213 lb 6.4 oz (96.8 kg)    Physical Exam  Constitutional: He is oriented to person, place, and time. No distress.  Neck: No JVD present. No thyromegaly present.  Cardiovascular: Normal rate and normal heart sounds.  Pulmonary/Chest: Effort normal and breath sounds normal.  Musculoskeletal: He exhibits no edema.  Neurological: He is alert and oriented to person, place, and time.  Skin: Skin is warm and dry. No erythema.  Psychiatric: He has a normal mood and affect. His behavior is normal. Thought content normal.  Vitals reviewed.   Lab Results  Component Value Date   WBC 6.0 06/21/2017   HGB 14.8 06/21/2017   HCT 44.7 06/21/2017   PLT 161.0 06/21/2017   GLUCOSE 104 (H) 06/21/2017   CHOL 123 06/21/2017   TRIG 65.0 06/21/2017   HDL 48.80 06/21/2017   LDLCALC 62 06/21/2017   ALT 13 06/21/2017   AST 20 06/21/2017   NA 144 06/21/2017   K 4.7 06/21/2017   CL 106 06/21/2017   CREATININE 1.24 06/21/2017   BUN 24 (H) 06/21/2017   CO2 30 06/21/2017   TSH 1.63 05/20/2015   PSA 5.16 (H) 06/21/2017   INR 1.74 (H) 08/27/2014    Dg Chest 2  View  Result Date: 10/31/2015 CLINICAL DATA:  Pt arrived by [facility] for cough, fever, bodyaches since Friday. Denies n/v/d. EXAM: CHEST - 2 VIEW COMPARISON:  08/27/2014 FINDINGS: Mild cardiomegaly, stable.  Atheromatous aorta.  Lungs are clear. No effusion.  No pneumothorax.  Azygos fissure. Visualized skeletal structures are unremarkable. IMPRESSION: No acute cardiopulmonary disease. Electronically Signed   By: Lucrezia Europe M.D.   On: 10/31/2015 18:45    Assessment & Plan:   Dalin was seen today for  establish care.  Diagnoses and all orders for this visit:  Essential hypertension  Chronic atrial fibrillation ([facility])  Elevated PSA   I am having Dell R. Beed maintain his Fish Oil, sildenafil, Saw Palmetto Berries, multivitamin, vitamin B-12, Vitamin D-3, vitamin C, Garlic, Propylene Glycol, furosemide, metoprolol tartrate, hydrALAZINE, KLOR-CON M20, DILT-XR, benazepril, dabigatran, and tamsulosin.  No orders of the defined types were placed in this encounter.   Follow-up: Return in about 6 months (around 01/01/2018) for HTN and  A-FIb.  Wilfred Lacy, NP

## 2017-07-04 NOTE — Patient Instructions (Addendum)
It was a great please meeting you and your wife. You labs are stable and you do not need any medications refills at this time.I will see you in 25months. Continue to follow up with cardiology, dermatology and Urology as directed by them.  Merry christmas.

## 2017-08-07 ENCOUNTER — Other Ambulatory Visit: Payer: Self-pay | Admitting: *Deleted

## 2017-08-07 ENCOUNTER — Other Ambulatory Visit: Payer: Self-pay | Admitting: Nurse Practitioner

## 2017-08-07 MED ORDER — POTASSIUM CHLORIDE CRYS ER 20 MEQ PO TBCR: 20.0000 meq | EXTENDED_RELEASE_TABLET | Freq: Every day | ORAL | 1 refills | Status: DC

## 2017-08-07 NOTE — Telephone Encounter (Unsigned)
Copied from [place] [phone]. Topic: General - Other >> Aug 07, 2017 11:25 AM Neva Seat wrote: Pt needs a refill on:  Klor-con [id number] tabsan 90 day supply.  Please let pt know when this is called in.    [place], [place], [place] [address] -  [phone]

## 2017-08-07 NOTE — Telephone Encounter (Unsigned)
Copied from [place] [id number]. Topic: General - Other >> Aug 07, 2017 11:25 AM Neva Seat wrote: Pt needs a refill on:  Klor-con [id number] tabsan 90 day supply.  Please let pt know when this is called in.    [place], [place], [place] [address] -  [phone]

## 2017-08-23 ENCOUNTER — Ambulatory Visit (INDEPENDENT_AMBULATORY_CARE_PROVIDER_SITE_OTHER): Payer: Medicare Other | Admitting: Nurse Practitioner

## 2017-08-23 ENCOUNTER — Ambulatory Visit (INDEPENDENT_AMBULATORY_CARE_PROVIDER_SITE_OTHER): Payer: Medicare Other

## 2017-08-23 ENCOUNTER — Encounter: Payer: Self-pay | Admitting: Nurse Practitioner

## 2017-08-23 VITALS — BP 138/82 | HR 87 | Temp 98.2°F | Ht 74.0 in | Wt 214.0 lb

## 2017-08-23 DIAGNOSIS — J00 Acute nasopharyngitis [common cold]: Secondary | ICD-10-CM

## 2017-08-23 DIAGNOSIS — R0989 Other specified symptoms and signs involving the circulatory and respiratory systems: Secondary | ICD-10-CM | POA: Diagnosis not present

## 2017-08-23 MED ORDER — AZITHROMYCIN 250 MG PO TABS
250.0000 mg | ORAL_TABLET | Freq: Every day | ORAL | 0 refills | Status: DC
Start: 2017-08-23 — End: 2017-12-23

## 2017-08-23 MED ORDER — SALINE SPRAY 0.65 % NA SOLN
1.0000 | NASAL | 0 refills | Status: DC | PRN
Start: 2017-08-23 — End: 2018-01-15

## 2017-08-23 MED ORDER — HYDROCODONE-HOMATROPINE 5-1.5 MG/5ML PO SYRP
5.0000 mL | ORAL_SOLUTION | Freq: Two times a day (BID) | ORAL | 0 refills | Status: DC | PRN
Start: 2017-08-23 — End: 2017-12-23

## 2017-08-23 MED ORDER — GUAIFENESIN ER 600 MG PO TB12: 600.0000 mg | ORAL_TABLET | Freq: Two times a day (BID) | ORAL | 0 refills | Status: DC | PRN

## 2017-08-23 MED ORDER — ALBUTEROL SULFATE HFA 108 (90 BASE) MCG/ACT IN AERS: 1.0000 | INHALATION_SPRAY | Freq: Four times a day (QID) | RESPIRATORY_TRACT | 0 refills | Status: DC | PRN

## 2017-08-23 NOTE — Patient Instructions (Addendum)
Normal CXR. Use medication as discussed. Sent azithromycin if no improvement by Monday  Upper Respiratory Infection, Adult Most upper respiratory infections (URIs) are caused by a virus. A URI affects the nose, throat, and upper air passages. The most common type of URI is often called "the common cold." Follow these instructions at home:  Take medicines only as told by your doctor.  Gargle warm saltwater or take cough drops to comfort your throat as told by your doctor.  Use a warm mist humidifier or inhale steam from a shower to increase air moisture. This may make it easier to breathe.  Drink enough fluid to keep your pee (urine) clear or pale yellow.  Eat soups and other clear broths.  Have a healthy diet.  Rest as needed.  Go back to work when your fever is gone or your doctor says it is okay. ? You may need to stay home longer to avoid giving your URI to others. ? You can also wear a face mask and wash your hands often to prevent spread of the virus.  Use your inhaler more if you have asthma.  Do not use any tobacco products, including cigarettes, chewing tobacco, or electronic cigarettes. If you need help quitting, ask your doctor. Contact a doctor if:  You are getting worse, not better.  Your symptoms are not helped by medicine.  You have chills.  You are getting more short of breath.  You have brown or red mucus.  You have yellow or brown discharge from your nose.  You have pain in your face, especially when you bend forward.  You have a fever.  You have puffy (swollen) neck glands.  You have pain while swallowing.  You have white areas in the back of your throat. Get help right away if:  You have very bad or constant: ? Headache. ? Ear pain. ? Pain in your forehead, behind your eyes, and over your cheekbones (sinus pain). ? Chest pain.  You have long-lasting (chronic) lung disease and any of the following: ? Wheezing. ? Long-lasting  cough. ? Coughing up blood. ? A change in your usual mucus.  You have a stiff neck.  You have changes in your: ? Vision. ? Hearing. ? Thinking. ? Mood. This information is not intended to replace advice given to you by your health care provider. Make sure you discuss any questions you have with your health care provider. Document Released: 01/09/2008 Document Revised: 03/25/2016 Document Reviewed: 10/28/2013 Elsevier Interactive Patient Education  2018 [employer].

## 2017-08-23 NOTE — Progress Notes (Signed)
Subjective:  Patient ID: [name], male    DOB: 27-Oct-1940  Age: 77 y.o. MRN: [id number]  CC: Cough (coughing,congestion,headache/5 days)   Cough  This is a new problem. The current episode started in the past 7 days. The problem has been gradually worsening. The problem occurs constantly. The cough is productive of sputum. Associated symptoms include headaches, nasal congestion, postnasal drip, shortness of breath and wheezing. Pertinent negatives include no chest pain, chills, fever, rhinorrhea or sore throat. The symptoms are aggravated by lying down. He has tried OTC cough suppressant for the symptoms.    Outpatient Medications Prior to Visit  Medication Sig Dispense Refill  . Ascorbic Acid (VITAMIN C) 1000 MG tablet Take 1,000 mg by mouth daily.    . benazepril (LOTENSIN) 20 MG tablet TAKE ONE TABLET BY MOUTH TWICE DAILY 180 tablet 1  . Cholecalciferol (VITAMIN D-3) 5000 UNITS TABS Take 1 tablet by mouth daily.    . dabigatran (PRADAXA) 150 MG CAPS capsule Take 1 capsule (150 mg total) by mouth every 12 (twelve) hours. 180 capsule 3  . DILT-XR 240 MG 24 hr capsule TAKE 1 CAPSULE BY MOUTH ONCE DAILY 90 capsule 1  . furosemide (LASIX) 20 MG tablet Take one tablet daily as needed for swelling. 30 tablet 3  . Garlic 4098 MG CAPS Take 1 capsule by mouth daily.    . hydrALAZINE (APRESOLINE) 25 MG tablet TAKE ONE TABLET BY MOUTH THREE TIMES DAILY 270 tablet 3  . metoprolol tartrate (LOPRESSOR) 50 MG tablet Take 1 tablet (50 mg total) by mouth 2 (two) times daily. 180 tablet 3  . Multiple Vitamin (MULTIVITAMIN) tablet Take 1 tablet by mouth daily.    . Omega-3 Fatty Acids (FISH OIL) 1000 MG CAPS Take 1 capsule by mouth daily.     . potassium chloride SA (KLOR-CON M20) 20 MEQ tablet Take 1 tablet (20 mEq total) by mouth daily. 90 tablet 1  . Propylene Glycol 0.6 % SOLN Apply 2-3 drops to eye daily as needed (for eyes).    . Saw Palmetto, Serenoa repens, (SAW PALMETTO BERRIES) 540 MG CAPS  Take 1,080 mg by mouth daily.      . sildenafil (VIAGRA) 100 MG tablet Take 100 mg by mouth daily as needed for erectile dysfunction.     . tamsulosin (FLOMAX) 0.4 MG CAPS capsule   0  . vitamin B-12 (CYANOCOBALAMIN) 500 MCG tablet Take 500 mcg by mouth daily.     No facility-administered medications prior to visit.     ROS See HPI  Objective:  BP 138/82   Pulse 87   Temp 98.2 F (36.8 C)   Ht 6\' 2"  (1.88 m)   Wt 214 lb (97.1 kg)   SpO2 97%   BMI 27.48 kg/m   BP Readings from Last 3 Encounters:  08/23/17 138/82  07/04/17 122/84  06/21/17 138/80    Wt Readings from Last 3 Encounters:  08/23/17 214 lb (97.1 kg)  07/04/17 208 lb (94.3 kg)  06/21/17 210 lb 12.8 oz (95.6 kg)    Physical Exam  Constitutional: He is oriented to person, place, and time. No distress.  HENT:  Right Ear: External ear normal.  Left Ear: External ear normal.  Nose: Nose normal.  Mouth/Throat: Oropharynx is clear and moist. No oropharyngeal exudate.  Neck: Normal range of motion. Neck supple.  Pulmonary/Chest: Effort normal. He has wheezes. He has rales.  Musculoskeletal: He exhibits no edema.  Neurological: He is alert and oriented to person,  place, and time.  Skin: Skin is warm and dry.  Vitals reviewed.   Lab Results  Component Value Date   WBC 6.0 06/21/2017   HGB 14.8 06/21/2017   HCT 44.7 06/21/2017   PLT 161.0 06/21/2017   GLUCOSE 104 (H) 06/21/2017   CHOL 123 06/21/2017   TRIG 65.0 06/21/2017   HDL 48.80 06/21/2017   LDLCALC 62 06/21/2017   ALT 13 06/21/2017   AST 20 06/21/2017   NA 144 06/21/2017   K 4.7 06/21/2017   CL 106 06/21/2017   CREATININE 1.24 06/21/2017   BUN 24 (H) 06/21/2017   CO2 30 06/21/2017   TSH 1.63 05/20/2015   PSA 5.16 (H) 06/21/2017   INR 1.74 (H) 08/27/2014    Dg Chest 2 View  Result Date: 10/31/2015 CLINICAL DATA:  Pt [facility] for cough, fever, bodyaches since Friday. Denies n/v/d. EXAM: CHEST - 2 VIEW COMPARISON:  08/27/2014  FINDINGS: Mild cardiomegaly, stable.  Atheromatous aorta.  Lungs are clear. No effusion.  No pneumothorax.  Azygos fissure. Visualized skeletal structures are unremarkable. IMPRESSION: No acute cardiopulmonary disease. Electronically Signed   By: Lucrezia Europe M.D.   On: 10/31/2015 18:45    Assessment & Plan:   [name] was seen today for cough.  Diagnoses and all orders for this visit:  Acute nasopharyngitis -     DG Chest 2 View; Future -     HYDROcodone-homatropine (HYCODAN) 5-1.5 MG/5ML syrup; Take 5 mLs by mouth every 12 (twelve) hours as needed for cough. -     guaiFENesin (MUCINEX) 600 MG 12 hr tablet; Take 1 tablet (600 mg total) by mouth 2 (two) times daily as needed for cough or to loosen phlegm. -     sodium chloride (OCEAN) 0.65 % SOLN nasal spray; Place 1 spray into both nostrils as needed for congestion. -     albuterol (PROAIR HFA) 108 (90 Base) MCG/ACT inhaler; Inhale 1-2 puffs into the lungs every 6 (six) hours as needed for wheezing or shortness of breath. -     DG Chest 2 View -     azithromycin (ZITHROMAX Z-PAK) 250 MG tablet; Take 1 tablet (250 mg total) by mouth daily. Take 2tabs on first day, then 1tab once a day till complete  Abnormal lung sounds -     DG Chest 2 View; Future -     HYDROcodone-homatropine (HYCODAN) 5-1.5 MG/5ML syrup; Take 5 mLs by mouth every 12 (twelve) hours as needed for cough. -     guaiFENesin (MUCINEX) 600 MG 12 hr tablet; Take 1 tablet (600 mg total) by mouth 2 (two) times daily as needed for cough or to loosen phlegm. -     albuterol (PROAIR HFA) 108 (90 Base) MCG/ACT inhaler; Inhale 1-2 puffs into the lungs every 6 (six) hours as needed for wheezing or shortness of breath. -     DG Chest 2 View -     azithromycin (ZITHROMAX Z-PAK) 250 MG tablet; Take 1 tablet (250 mg total) by mouth daily. Take 2tabs on first day, then 1tab once a day till complete   I am having [name] start on HYDROcodone-homatropine, guaiFENesin, sodium chloride,  albuterol, and azithromycin. I am also having him maintain his Fish Oil, sildenafil, Saw Palmetto Berries, multivitamin, vitamin B-12, Vitamin D-3, vitamin C, Garlic, Propylene Glycol, furosemide, metoprolol tartrate, hydrALAZINE, DILT-XR, benazepril, dabigatran, tamsulosin, and potassium chloride SA.  Meds ordered this encounter  Medications  . HYDROcodone-homatropine (HYCODAN) 5-1.5 MG/5ML syrup    Sig:  Take 5 mLs by mouth every 12 (twelve) hours as needed for cough.    Dispense:  60 mL    Refill:  0    Order Specific Question:   Supervising Provider    Answer:   Lucille Passy [[id number]]  . guaiFENesin (MUCINEX) 600 MG 12 hr tablet    Sig: Take 1 tablet (600 mg total) by mouth 2 (two) times daily as needed for cough or to loosen phlegm.    Dispense:  14 tablet    Refill:  0    Order Specific Question:   Supervising Provider    Answer:   Lucille Passy [[id number]]  . sodium chloride (OCEAN) 0.65 % SOLN nasal spray    Sig: Place 1 spray into both nostrils as needed for congestion.    Dispense:  15 mL    Refill:  0    Order Specific Question:   Supervising Provider    Answer:   Lucille Passy [[id number]]  . albuterol (PROAIR HFA) 108 (90 Base) MCG/ACT inhaler    Sig: Inhale 1-2 puffs into the lungs every 6 (six) hours as needed for wheezing or shortness of breath.    Dispense:  1 Inhaler    Refill:  0    Order Specific Question:   Supervising Provider    Answer:   Lucille Passy [[id number]]  . azithromycin (ZITHROMAX Z-PAK) 250 MG tablet    Sig: Take 1 tablet (250 mg total) by mouth daily. Take 2tabs on first day, then 1tab once a day till complete    Dispense:  6 tablet    Refill:  0    Order Specific Question:   Supervising Provider    Answer:   Lucille Passy [[id number]]    Follow-up: Return if symptoms worsen or fail to improve.  Samuel Lacy, NP

## 2017-08-29 NOTE — Progress Notes (Unsigned)
This encounter was created in error - please disregard.

## 2017-10-23 ENCOUNTER — Ambulatory Visit (INDEPENDENT_AMBULATORY_CARE_PROVIDER_SITE_OTHER): Payer: Medicare Other | Admitting: Nurse Practitioner

## 2017-10-23 ENCOUNTER — Encounter: Payer: Self-pay | Admitting: Nurse Practitioner

## 2017-10-23 VITALS — BP 142/70 | HR 75 | Temp 98.1°F | Wt 216.6 lb

## 2017-10-23 DIAGNOSIS — N390 Urinary tract infection, site not specified: Secondary | ICD-10-CM

## 2017-10-23 LAB — POCT URINALYSIS DIPSTICK
BILIRUBIN UA: NEGATIVE
Blood, UA: 10
GLUCOSE UA: NEGATIVE
Nitrite, UA: NEGATIVE
Spec Grav, UA: 1.015 (ref 1.010–1.025)
UROBILINOGEN UA: 1 U/dL
pH, UA: 7 (ref 5.0–8.0)

## 2017-10-23 MED ORDER — DOXYCYCLINE HYCLATE 100 MG PO TABS: 100.0000 mg | ORAL_TABLET | Freq: Two times a day (BID) | ORAL | 0 refills | Status: DC

## 2017-10-23 MED ORDER — PHENAZOPYRIDINE HCL 100 MG PO TABS: 100.0000 mg | ORAL_TABLET | Freq: Three times a day (TID) | ORAL | 0 refills | Status: DC | PRN

## 2017-10-23 MED ORDER — CEFTRIAXONE SODIUM 500 MG IJ SOLR
500.0000 mg | Freq: Once | INTRAMUSCULAR | Status: AC
  Administered 2017-10-23: 500 mg via INTRAMUSCULAR

## 2017-10-23 NOTE — Patient Instructions (Signed)
Push oral hydration with water mostly.  Call office if persistent fever and dysuria after 48hrs.  You will be called with urine culture results.

## 2017-10-23 NOTE — Progress Notes (Signed)
Subjective:  Patient ID: [name], male    DOB: 1941-07-22  Age: 77 y.o. MRN: [id number]  CC: Urinary Tract Infection (fever last night, frequent urination, burning and irritation.)  Accompanied by wife.  Urinary Tract Infection   This is a new problem. The current episode started yesterday. The problem occurs every urination. The problem has been gradually worsening. The quality of the pain is described as burning. The maximum temperature recorded prior to his arrival was 100 - 100.9 F. The fever has been present for 1 - 2 days. He is sexually active. There is no history of pyelonephritis. Associated symptoms include chills, frequency, hesitancy and urgency. Pertinent negatives include no discharge, flank pain, hematuria, nausea, possible pregnancy, sweats or vomiting. He has tried acetaminophen ( and cystex) for the symptoms. The treatment provided mild relief. His past medical history is significant for urinary stasis. There is no history of kidney stones or recurrent UTIs.   Outpatient Medications Prior to Visit  Medication Sig Dispense Refill  . albuterol (PROAIR HFA) 108 (90 Base) MCG/ACT inhaler Inhale 1-2 puffs into the lungs every 6 (six) hours as needed for wheezing or shortness of breath. 1 Inhaler 0  . Ascorbic Acid (VITAMIN C) 1000 MG tablet Take 1,000 mg by mouth daily.    Marland Kitchen azithromycin (ZITHROMAX Z-PAK) 250 MG tablet Take 1 tablet (250 mg total) by mouth daily. Take 2tabs on first day, then 1tab once a day till complete 6 tablet 0  . benazepril (LOTENSIN) 20 MG tablet TAKE ONE TABLET BY MOUTH TWICE DAILY 180 tablet 1  . Cholecalciferol (VITAMIN D-3) 5000 UNITS TABS Take 1 tablet by mouth daily.    . dabigatran (PRADAXA) 150 MG CAPS capsule Take 1 capsule (150 mg total) by mouth every 12 (twelve) hours. 180 capsule 3  . DILT-XR 240 MG 24 hr capsule TAKE 1 CAPSULE BY MOUTH ONCE DAILY 90 capsule 1  . furosemide (LASIX) 20 MG tablet Take one tablet daily as needed for swelling.  30 tablet 3  . Garlic 2355 MG CAPS Take 1 capsule by mouth daily.    Marland Kitchen guaiFENesin (MUCINEX) 600 MG 12 hr tablet Take 1 tablet (600 mg total) by mouth 2 (two) times daily as needed for cough or to loosen phlegm. 14 tablet 0  . hydrALAZINE (APRESOLINE) 25 MG tablet TAKE ONE TABLET BY MOUTH THREE TIMES DAILY 270 tablet 3  . HYDROcodone-homatropine (HYCODAN) 5-1.5 MG/5ML syrup Take 5 mLs by mouth every 12 (twelve) hours as needed for cough. 60 mL 0  . metoprolol tartrate (LOPRESSOR) 50 MG tablet Take 1 tablet (50 mg total) by mouth 2 (two) times daily. 180 tablet 3  . Multiple Vitamin (MULTIVITAMIN) tablet Take 1 tablet by mouth daily.    . Omega-3 Fatty Acids (FISH OIL) 1000 MG CAPS Take 1 capsule by mouth daily.     . potassium chloride SA (KLOR-CON M20) 20 MEQ tablet Take 1 tablet (20 mEq total) by mouth daily. 90 tablet 1  . Propylene Glycol 0.6 % SOLN Apply 2-3 drops to eye daily as needed (for eyes).    . Saw Palmetto, Serenoa repens, (SAW PALMETTO BERRIES) 540 MG CAPS Take 1,080 mg by mouth daily.      . sildenafil (VIAGRA) 100 MG tablet Take 100 mg by mouth daily as needed for erectile dysfunction.     . sodium chloride (OCEAN) 0.65 % SOLN nasal spray Place 1 spray into both nostrils as needed for congestion. 15 mL 0  . tamsulosin (  FLOMAX) 0.4 MG CAPS capsule   0  . vitamin B-12 (CYANOCOBALAMIN) 500 MCG tablet Take 500 mcg by mouth daily.    . fluorouracil (EFUDEX) 5 % cream      No facility-administered medications prior to visit.     ROS See HPI  Objective:  BP (!) 142/70 (BP Location: Left Arm, Patient Position: Sitting, Cuff Size: Normal)   Pulse 75   Temp 98.1 F (36.7 C)   Wt 216 lb 9.6 oz (98.2 kg)   SpO2 96%   BMI 27.81 kg/m   BP Readings from Last 3 Encounters:  10/23/17 (!) 142/70  08/23/17 138/82  07/04/17 122/84    Wt Readings from Last 3 Encounters:  10/23/17 216 lb 9.6 oz (98.2 kg)  08/23/17 214 lb (97.1 kg)  07/04/17 208 lb (94.3 kg)    Physical Exam    Constitutional: He is oriented to person, place, and time. No distress.  Cardiovascular: Normal rate.  Pulmonary/Chest: Effort normal.  Abdominal: Soft. Bowel sounds are normal. He exhibits no distension. There is no tenderness.  Negative CVA tenderness  Neurological: He is alert and oriented to person, place, and time.  Skin: Skin is warm and dry. No rash noted. No erythema.  Psychiatric: He has a normal mood and affect. His behavior is normal.  Vitals reviewed.   Lab Results  Component Value Date   WBC 6.0 06/21/2017   HGB 14.8 06/21/2017   HCT 44.7 06/21/2017   PLT 161.0 06/21/2017   GLUCOSE 104 (H) 06/21/2017   CHOL 123 06/21/2017   TRIG 65.0 06/21/2017   HDL 48.80 06/21/2017   LDLCALC 62 06/21/2017   ALT 13 06/21/2017   AST 20 06/21/2017   NA 144 06/21/2017   K 4.7 06/21/2017   CL 106 06/21/2017   CREATININE 1.24 06/21/2017   BUN 24 (H) 06/21/2017   CO2 30 06/21/2017   TSH 1.63 05/20/2015   PSA 5.16 (H) 06/21/2017   INR 1.74 (H) 08/27/2014    Dg Chest 2 View  Result Date: 10/31/2015 CLINICAL DATA:  [facility] for cough, fever, bodyaches since Friday. Denies n/v/d. EXAM: CHEST - 2 VIEW COMPARISON:  08/27/2014 FINDINGS: Mild cardiomegaly, stable.  Atheromatous aorta.  Lungs are clear. No effusion.  No pneumothorax.  Azygos fissure. Visualized skeletal structures are unremarkable. IMPRESSION: No acute cardiopulmonary disease. Electronically Signed   By: Lucrezia Europe M.D.   On: 10/31/2015 18:45    Assessment & Plan:   [name] was seen today for urinary tract infection.  Diagnoses and all orders for this visit:  Complicated UTI (urinary tract infection) -     POCT Urinalysis Dipstick -     cefTRIAXone (ROCEPHIN) injection 500 mg -     doxycycline (VIBRA-TABS) 100 MG tablet; Take 1 tablet (100 mg total) by mouth 2 (two) times daily for 7 days. -     Urine Culture -     phenazopyridine (PYRIDIUM) 100 MG tablet; Take 1 tablet (100 mg total) by mouth 3 (three)  times daily as needed for pain (with food).   I am having [name] start on doxycycline and phenazopyridine. I am also having him maintain his Fish Oil, sildenafil, Saw Palmetto Berries, multivitamin, vitamin B-12, Vitamin D-3, vitamin C, Garlic, Propylene Glycol, furosemide, metoprolol tartrate, hydrALAZINE, DILT-XR, benazepril, dabigatran, tamsulosin, potassium chloride SA, HYDROcodone-homatropine, guaiFENesin, sodium chloride, albuterol, azithromycin, and fluorouracil. We administered cefTRIAXone.  Meds ordered this encounter  Medications  . cefTRIAXone (ROCEPHIN) injection 500 mg  . doxycycline (  VIBRA-TABS) 100 MG tablet    Sig: Take 1 tablet (100 mg total) by mouth 2 (two) times daily for 7 days.    Dispense:  14 tablet    Refill:  0    Order Specific Question:   Supervising Provider    Answer:   Lucille Passy [[id number]]  . phenazopyridine (PYRIDIUM) 100 MG tablet    Sig: Take 1 tablet (100 mg total) by mouth 3 (three) times daily as needed for pain (with food).    Dispense:  10 tablet    Refill:  0    Order Specific Question:   Supervising Provider    Answer:   Lucille Passy [[id number]]    Follow-up: Return if symptoms worsen or fail to improve.  Wilfred Lacy, NP

## 2017-10-25 ENCOUNTER — Encounter: Payer: Self-pay | Admitting: Nurse Practitioner

## 2017-10-25 LAB — URINE CULTURE
MICRO NUMBER: 90351279
SPECIMEN QUALITY: ADEQUATE

## 2017-10-28 MED ORDER — SULFAMETHOXAZOLE-TRIMETHOPRIM 400-80 MG PO TABS: 1.0000 | ORAL_TABLET | Freq: Two times a day (BID) | ORAL | 0 refills | Status: DC

## 2017-10-28 NOTE — Addendum Note (Signed)
Addended by: Wilfred Lacy L on: 10/28/2017 08:10 AM   Modules accepted: Orders

## 2017-11-18 ENCOUNTER — Telehealth: Payer: Self-pay | Admitting: Nurse Practitioner

## 2017-11-18 ENCOUNTER — Other Ambulatory Visit: Payer: Self-pay

## 2017-11-18 DIAGNOSIS — I10 Essential (primary) hypertension: Secondary | ICD-10-CM

## 2017-11-18 MED ORDER — DILTIAZEM HCL ER 240 MG PO CP24: 240.0000 mg | ORAL_CAPSULE | Freq: Every day | ORAL | 1 refills | Status: DC

## 2017-11-18 NOTE — Telephone Encounter (Signed)
Copied from [place] [id number]. Topic: Quick Communication - Rx Refill/Question >> Nov 18, 2017  9:47 AM Boyd Kerbs wrote:  Medication:  DILT-XR 240 MG 24 hr capsule  Has the patient contacted their pharmacy? No. Going to Dr. Lorayne Marek now.   (Agent: If no, request that the patient contact the pharmacy for the refill.) Preferred Pharmacy (with phone number or street name):   [facility] ([address]), [place] [address] [place] ([place]) [place] [address] Phone: [phone] Fax: [phone]

## 2017-11-18 NOTE — Telephone Encounter (Signed)
Pt requesting refill of Dilt-XR 240mg     24hr capsule previously filled by another provider.   LOV: 10/23/17  [name]  [facility]  [address] [place]

## 2017-11-18 NOTE — Telephone Encounter (Signed)
Rx sent in

## 2017-11-18 NOTE — Telephone Encounter (Signed)
Copied from [place] [phone]. Topic: Quick Communication - Rx Refill/Question >> Nov 18, 2017  9:45 AM Boyd Kerbs wrote: Medication:  DILT-XR 240 MG 24 hr capsule  Has the patient contacted their pharmacy? No. Going to Dr. Lorayne Marek now.   (Agent: If no, request that the patient contact the pharmacy for the refill.) Preferred Pharmacy (with phone number or street name):   [facility] ([address]), [place] - [address] [place] ([place]) [place] [address] Phone: [phone] Fax: [phone]   Agent: Please be advised that RX refills may take up to 3 business days. We ask that you follow-up with your pharmacy.

## 2017-11-18 NOTE — Telephone Encounter (Signed)
Rx sent in per [facility]. TLG

## 2017-11-28 ENCOUNTER — Other Ambulatory Visit: Payer: Self-pay | Admitting: Family Medicine

## 2017-11-28 MED ORDER — DILTIAZEM HCL ER 240 MG PO CP24: 240.0000 mg | ORAL_CAPSULE | Freq: Every day | ORAL | 1 refills | Status: DC

## 2017-11-28 NOTE — Telephone Encounter (Signed)
[name] changed Rx due to previous medication being on back order. Called pt to inform of the change and location pick up. TLG

## 2017-11-28 NOTE — Telephone Encounter (Signed)
Pt says that he was told by pharmacy that they are out of stock with medication. Pt would like assistance with finding a different location that may have it in stock. Pt would like for provider /assistant to try [facility] on [address] because it is the closest to him.   Please assist further.       CB: [phone]

## 2017-11-28 NOTE — Telephone Encounter (Signed)
Resent to [facility] [address]/thx dmf

## 2017-12-02 ENCOUNTER — Telehealth: Payer: Self-pay | Admitting: Nurse Practitioner

## 2017-12-02 MED ORDER — BENAZEPRIL HCL 20 MG PO TABS: 20.0000 mg | ORAL_TABLET | Freq: Two times a day (BID) | ORAL | 1 refills | Status: DC

## 2017-12-02 NOTE — Telephone Encounter (Signed)
Ok to send in? Last ov note stated BO is managing by Dr. [place]. Please advise.

## 2017-12-02 NOTE — Telephone Encounter (Signed)
Rx sent in 6 mo supply. Pt is aware.

## 2017-12-02 NOTE — Telephone Encounter (Signed)
Copied from [place] [phone]. Topic: Quick Communication - Rx Refill/Question >> Dec 02, 2017 10:56 AM Percell Belt A wrote: Medication: benazepril (LOTENSIN) 20 MG tablet [[id number]]  Has the patient contacted their pharmacy? yes (Agent: If no, request that the patient contact the pharmacy for the refill.) [facility] Pharm[facility]  Agent: Please be advised that RX refills may take up to 3 business days. We ask that you follow-up with your pharmacy.

## 2017-12-02 NOTE — Telephone Encounter (Signed)
Relation to pt:  Self  Call back number:[phone] Pharmacy: [facility] ([address]), [place] [phone] (Phone) [phone] (Fax)    Reason for call:  Patient returned call confirming pharmacy, patient did state he has 1 pill left and would like to speak with the nurse regarding the status.

## 2017-12-02 NOTE — Telephone Encounter (Signed)
Request for refill of Benazepril (lotensin) 20mg  tab. Previously filled by another provider.   Last OV addressing medication 07/04/17  [id number] Nche,NP  Left message for pt to return call to office to confirm which [place] he would like to use for refills.

## 2017-12-12 ENCOUNTER — Telehealth: Payer: Self-pay | Admitting: Nurse Practitioner

## 2017-12-12 ENCOUNTER — Other Ambulatory Visit: Payer: Self-pay

## 2017-12-12 MED ORDER — FUROSEMIDE 20 MG PO TABS: ORAL_TABLET | ORAL | 0 refills | Status: DC

## 2017-12-12 NOTE — Telephone Encounter (Signed)
rx sent to pharmacy [facility]..the patient informed

## 2017-12-12 NOTE — Telephone Encounter (Signed)
Furosemide (Tab) LASIX 20 MG Take one tablet daily as needed for swelling.   Pt came in the office and would like to have this medications call into the pharmacy on file, and if he could be notified once it is sent.

## 2017-12-23 ENCOUNTER — Encounter: Payer: Self-pay | Admitting: Nurse Practitioner

## 2017-12-23 ENCOUNTER — Ambulatory Visit (INDEPENDENT_AMBULATORY_CARE_PROVIDER_SITE_OTHER): Payer: Medicare Other | Admitting: Nurse Practitioner

## 2017-12-23 VITALS — BP 134/88 | HR 67 | Temp 98.0°F | Ht 74.0 in | Wt 217.0 lb

## 2017-12-23 DIAGNOSIS — R972 Elevated prostate specific antigen [PSA]: Secondary | ICD-10-CM | POA: Diagnosis not present

## 2017-12-23 DIAGNOSIS — W57XXXA Bitten or stung by nonvenomous insect and other nonvenomous arthropods, initial encounter: Secondary | ICD-10-CM

## 2017-12-23 DIAGNOSIS — I10 Essential (primary) hypertension: Secondary | ICD-10-CM

## 2017-12-23 DIAGNOSIS — S70369A Insect bite (nonvenomous), unspecified thigh, initial encounter: Secondary | ICD-10-CM

## 2017-12-23 DIAGNOSIS — E782 Mixed hyperlipidemia: Secondary | ICD-10-CM

## 2017-12-23 DIAGNOSIS — A6920 Lyme disease, unspecified: Secondary | ICD-10-CM

## 2017-12-23 DIAGNOSIS — N183 Chronic kidney disease, stage 3 unspecified: Secondary | ICD-10-CM

## 2017-12-23 LAB — LIPID PANEL
CHOL/HDL RATIO: 3
Cholesterol: 107 mg/dL (ref 0–200)
HDL: 41.4 mg/dL (ref >39.00)
LDL CALC: 45 mg/dL (ref 0–99)
NONHDL: 65.56
TRIGLYCERIDES: 104 mg/dL (ref 0.0–149.0)
VLDL: 20.8 mg/dL (ref 0.0–40.0)

## 2017-12-23 LAB — PSA: PSA: 3.88 ng/mL (ref 0.10–4.00)

## 2017-12-23 LAB — BASIC METABOLIC PANEL
BUN: 21 mg/dL (ref 6–23)
CALCIUM: 9.4 mg/dL (ref 8.4–10.5)
CHLORIDE: 106 meq/L (ref 96–112)
CO2: 28 mEq/L (ref 19–32)
CREATININE: 1.29 mg/dL (ref 0.40–1.50)
GFR: 57.26 mL/min — ABNORMAL LOW (ref >60.00)
Glucose, Bld: 96 mg/dL (ref 70–99)
Potassium: 4.4 mEq/L (ref 3.5–5.1)
Sodium: 142 mEq/L (ref 135–145)

## 2017-12-23 LAB — HEPATIC FUNCTION PANEL
ALK PHOS: 67 U/L (ref 39–117)
ALT: 12 U/L (ref 0–53)
AST: 15 U/L (ref 0–37)
Albumin: 4.2 g/dL (ref 3.5–5.2)
BILIRUBIN DIRECT: 0.2 mg/dL (ref 0.0–0.3)
BILIRUBIN TOTAL: 1 mg/dL (ref 0.2–1.2)
TOTAL PROTEIN: 6.7 g/dL (ref 6.0–8.3)

## 2017-12-23 MED ORDER — DOXYCYCLINE HYCLATE 100 MG PO TABS: 100.0000 mg | ORAL_TABLET | Freq: Two times a day (BID) | ORAL | 0 refills | Status: AC

## 2017-12-23 MED ORDER — SACCHAROMYCES BOULARDII 250 MG PO CAPS: 250.0000 mg | ORAL_CAPSULE | Freq: Two times a day (BID) | ORAL | Status: DC

## 2017-12-23 NOTE — Patient Instructions (Addendum)
Stable lab results with improved PSA  Reschedule upcoming appt to July (fasting).  Bring form that indicates what was complete during home Wellness visit.  Due to recent orla abx use, advised to use probiotic(florastor or curturelle) during course of doxycycline use.  Lyme Disease Lyme disease is an infection that affects many parts of the body, including the skin, joints, and nervous system. It is a bacterial infection that starts from the bite of an infected tick. The infection can spread, and some of the symptoms are similar to the flu. If Lyme disease is not treated, it may cause joint pain, swelling, numbness, problems thinking, fatigue, muscle weakness, and other problems. What are the causes? This condition is caused by bacteria called Borrelia burgdorferi. You can get Lyme disease by being bitten by an infected tick. The tick must be attached to your skin to pass along the infection. Deer often carry infected ticks. What increases the risk? The following factors may make you more likely to develop this condition:  Living in or visiting these areas in the [place] ? [place]. ? [place]. ? [place].  Spending time in wooded or grassy areas.  Being outdoors with exposed skin.  Camping, gardening, hiking, fishing, or hunting outdoors.  Failing to remove a tick from your skin within 3-4 days.  What are the signs or symptoms? Symptoms of this condition include:  A round, red rash that surrounds the center of the tick bite. This is the first sign of infection. The center of the rash may be blood colored or have tiny blisters.  Fatigue.  Headache.  Chills and fever.  General achiness.  Joint pain, often in the knees.  Muscle pain.  Swollen lymph glands.  Stiff neck.  How is this diagnosed? This condition is diagnosed based on:  Your symptoms and medical history.  A physical exam.  A blood test.  How is this treated? The main  treatment for this condition is antibiotic medicine, which is usually taken by mouth (orally). The length of treatment depends on how soon after a tick bite you begin taking the medicine. In some cases, treatment is necessary for several weeks. If the infection is severe, antibiotics may need to be given through an IV tube that is inserted into one of your veins. Follow these instructions at home:  Take your antibiotic medicine as told by your health care provider. Do not stop taking the antibiotic even if you start to feel better.  Ask your health care provider about takinga probiotic in between doses of your antibiotic to help avoid stomach upset or diarrhea.  Check with your health care provider before supplementing your treatment. Many alternative therapies have not been proven and may be harmful to you.  Keep all follow-up visits as told by your health care provider. This is important. How is this prevented? You can become reinfected if you get another tick bite from an infected tick. Take these steps to help prevent an infection:  Cover your skin with light-colored clothing when you are outdoors in the spring and summer months.  Spray clothing and skin with bug spray. The spray should be 20-30% DEET.  Avoid wooded, grassy, and shaded areas.  Remove yard litter, brush, trash, and plants that attract deer and rodents.  Check yourself for ticks when you come indoors.  Wash clothing worn each day.  Check your pets for ticks before they come inside.  If you find a tick: ? Remove it with  tweezers. ? Clean your hands and the bite area with rubbing alcohol or soap and water.  Pregnant women should take special care to avoid tick bites because the infection can be passed along to the fetus. Contact a health care provider if:  You have symptoms after treatment.  You have removed a tick and want to bring it to your health care provider for testing. Get help right away if:  You have  an irregular heartbeat.  You have nerve pain.  Your face feels numb. This information is not intended to replace advice given to you by your health care provider. Make sure you discuss any questions you have with your health care provider. Document Released: 10/29/2000 Document Revised: 03/13/2016 Document Reviewed: 03/13/2016 Elsevier Interactive Patient Education  2018 [employer].

## 2017-12-23 NOTE — Progress Notes (Signed)
Subjective:  Patient ID: [name], male    DOB: 06/19/1941  Age: 77 y.o. MRN: [id number]  CC: Tick Removal (tick bite/red and itchy/big circle/ 5 days)  Rash  This is a new problem. The current episode started in the past 7 days. The problem has been gradually worsening since onset. The affected locations include the right upper leg. The rash is characterized by redness, swelling and itchiness. He was exposed to an insect bite/sting. Pertinent negatives include no anorexia, congestion, cough, fatigue, fever, joint pain or nail changes. Past treatments include nothing.   HTN: Controlled with hydralazine and benazepril  A-fib: Rate controlled with cardizem and metoprolol. Current pradaxa use  Elevated PSA: Improved urinary symptoms.  Outpatient Medications Prior to Visit  Medication Sig Dispense Refill  . albuterol (PROAIR HFA) 108 (90 Base) MCG/ACT inhaler Inhale 1-2 puffs into the lungs every 6 (six) hours as needed for wheezing or shortness of breath. 1 Inhaler 0  . Ascorbic Acid (VITAMIN C) 1000 MG tablet Take 1,000 mg by mouth daily.    . benazepril (LOTENSIN) 20 MG tablet Take 1 tablet (20 mg total) by mouth 2 (two) times daily. 180 tablet 1  . Cholecalciferol (VITAMIN D-3) 5000 UNITS TABS Take 1 tablet by mouth daily.    . dabigatran (PRADAXA) 150 MG CAPS capsule Take 1 capsule (150 mg total) by mouth every 12 (twelve) hours. 180 capsule 3  . diltiazem (DILT-XR) 240 MG 24 hr capsule Take 1 capsule (240 mg total) by mouth daily. 90 capsule 1  . furosemide (LASIX) 20 MG tablet Take one tablet daily as needed for swelling. 30 tablet 0  . Garlic 4235 MG CAPS Take 1 capsule by mouth daily.    Marland Kitchen guaiFENesin (MUCINEX) 600 MG 12 hr tablet Take 1 tablet (600 mg total) by mouth 2 (two) times daily as needed for cough or to loosen phlegm. 14 tablet 0  . hydrALAZINE (APRESOLINE) 25 MG tablet TAKE ONE TABLET BY MOUTH THREE TIMES DAILY 270 tablet 3  . metoprolol tartrate (LOPRESSOR) 50 MG  tablet Take 1 tablet (50 mg total) by mouth 2 (two) times daily. 180 tablet 3  . Multiple Vitamin (MULTIVITAMIN) tablet Take 1 tablet by mouth daily.    . potassium chloride SA (KLOR-CON M20) 20 MEQ tablet Take 1 tablet (20 mEq total) by mouth daily. 90 tablet 1  . Propylene Glycol 0.6 % SOLN Apply 2-3 drops to eye daily as needed (for eyes).    . Saw Palmetto, Serenoa repens, (SAW PALMETTO BERRIES) 540 MG CAPS Take 1,080 mg by mouth daily.      . sildenafil (VIAGRA) 100 MG tablet Take 100 mg by mouth daily as needed for erectile dysfunction.     . sodium chloride (OCEAN) 0.65 % SOLN nasal spray Place 1 spray into both nostrils as needed for congestion. 15 mL 0  . tamsulosin (FLOMAX) 0.4 MG CAPS capsule   0  . vitamin B-12 (CYANOCOBALAMIN) 500 MCG tablet Take 500 mcg by mouth daily.    Marland Kitchen HYDROcodone-homatropine (HYCODAN) 5-1.5 MG/5ML syrup Take 5 mLs by mouth every 12 (twelve) hours as needed for cough. 60 mL 0  . fluorouracil (EFUDEX) 5 % cream     . Omega-3 Fatty Acids (FISH OIL) 1000 MG CAPS Take 1 capsule by mouth daily.     Marland Kitchen azithromycin (ZITHROMAX Z-PAK) 250 MG tablet Take 1 tablet (250 mg total) by mouth daily. Take 2tabs on first day, then 1tab once a day till complete (Patient  not taking: Reported on 12/23/2017) 6 tablet 0  . phenazopyridine (PYRIDIUM) 100 MG tablet Take 1 tablet (100 mg total) by mouth 3 (three) times daily as needed for pain (with food). (Patient not taking: Reported on 12/23/2017) 10 tablet 0  . sulfamethoxazole-trimethoprim (BACTRIM,SEPTRA) 400-80 MG tablet Take 1 tablet by mouth 2 (two) times daily. With food (Patient not taking: Reported on 12/23/2017) 14 tablet 0   No facility-administered medications prior to visit.     ROS See HPI  Objective:  BP 134/88   Pulse 67   Temp 98 F (36.7 C) (Oral)   Ht 6\' 2"  (1.88 m)   Wt 217 lb (98.4 kg)   SpO2 97%   BMI 27.86 kg/m   BP Readings from Last 3 Encounters:  12/23/17 134/88  10/23/17 (!) 142/70  08/23/17  138/82    Wt Readings from Last 3 Encounters:  12/23/17 217 lb (98.4 kg)  10/23/17 216 lb 9.6 oz (98.2 kg)  08/23/17 214 lb (97.1 kg)    Physical Exam  Constitutional: He is oriented to person, place, and time. No distress.  Cardiovascular: Normal rate.  Pulmonary/Chest: Effort normal.  Musculoskeletal: Normal range of motion.  Neurological: He is alert and oriented to person, place, and time.  Skin: Rash noted. Rash is maculopapular. There is erythema.     No tick remnant  Vitals reviewed.   Lab Results  Component Value Date   WBC 6.0 06/21/2017   HGB 14.8 06/21/2017   HCT 44.7 06/21/2017   PLT 161.0 06/21/2017   GLUCOSE 96 12/23/2017   CHOL 107 12/23/2017   TRIG 104.0 12/23/2017   HDL 41.40 12/23/2017   LDLCALC 45 12/23/2017   ALT 12 12/23/2017   AST 15 12/23/2017   NA 142 12/23/2017   K 4.4 12/23/2017   CL 106 12/23/2017   CREATININE 1.29 12/23/2017   BUN 21 12/23/2017   CO2 28 12/23/2017   TSH 1.63 05/20/2015   PSA 3.88 12/23/2017   INR 1.74 (H) 08/27/2014    Dg Chest 2 View  Result Date: 10/31/2015 CLINICAL DATA:  [facility] for cough, fever, bodyaches since Friday. Denies n/v/d. EXAM: CHEST - 2 VIEW COMPARISON:  08/27/2014 FINDINGS: Mild cardiomegaly, stable.  Atheromatous aorta.  Lungs are clear. No effusion.  No pneumothorax.  Azygos fissure. Visualized skeletal structures are unremarkable. IMPRESSION: No acute cardiopulmonary disease. Electronically Signed   By: Lucrezia Europe M.D.   On: 10/31/2015 18:45    Assessment & Plan:   [name] was seen today for tick removal.  Diagnoses and all orders for this visit:  Tick bite, initial encounter -     saccharomyces boulardii (FLORASTOR) 250 MG capsule; Take 1 capsule (250 mg total) by mouth 2 (two) times daily. -     doxycycline (VIBRA-TABS) 100 MG tablet; Take 1 tablet (100 mg total) by mouth 2 (two) times daily for 14 days.  Erythema migrans (Lyme disease) -     saccharomyces boulardii  (FLORASTOR) 250 MG capsule; Take 1 capsule (250 mg total) by mouth 2 (two) times daily. -     doxycycline (VIBRA-TABS) 100 MG tablet; Take 1 tablet (100 mg total) by mouth 2 (two) times daily for 14 days.  Essential hypertension -     Basic metabolic panel  CKD (chronic kidney disease), stage III (HCC) -     Basic metabolic panel  Elevated PSA -     PSA  Mixed hyperlipidemia -     Hepatic function panel -  Lipid panel   I have discontinued [name]'s HYDROcodone-homatropine, azithromycin, phenazopyridine, and sulfamethoxazole-trimethoprim. I am also having him start on saccharomyces boulardii and doxycycline. Additionally, I am having him maintain his Fish Oil, sildenafil, Saw Palmetto Berries, multivitamin, vitamin B-12, Vitamin D-3, vitamin C, Garlic, Propylene Glycol, metoprolol tartrate, hydrALAZINE, dabigatran, tamsulosin, potassium chloride SA, guaiFENesin, sodium chloride, albuterol, fluorouracil, diltiazem, benazepril, and furosemide.  Meds ordered this encounter  Medications  . saccharomyces boulardii (FLORASTOR) 250 MG capsule    Sig: Take 1 capsule (250 mg total) by mouth 2 (two) times daily.    Order Specific Question:   Supervising Provider    Answer:   Lucille Passy [[id number]]  . doxycycline (VIBRA-TABS) 100 MG tablet    Sig: Take 1 tablet (100 mg total) by mouth 2 (two) times daily for 14 days.    Dispense:  28 tablet    Refill:  0    Order Specific Question:   Supervising Provider    Answer:   Lucille Passy [[id number]]    Follow-up: Return in about 6 weeks (around 02/03/2018) for HTN,, hyperlipidemia, A-fib.  Wilfred Lacy, NP

## 2017-12-24 ENCOUNTER — Encounter: Payer: Self-pay | Admitting: Cardiology

## 2017-12-27 ENCOUNTER — Ambulatory Visit: Payer: Medicare Other | Admitting: Nurse Practitioner

## 2018-01-01 ENCOUNTER — Ambulatory Visit: Payer: Medicare Other | Admitting: Nurse Practitioner

## 2018-01-08 ENCOUNTER — Other Ambulatory Visit: Payer: Self-pay | Admitting: Nurse Practitioner

## 2018-01-11 NOTE — Progress Notes (Signed)
[name] Date of Birth: 02-12-1941   History of Present Illness: [name] is seen today for followup of  atrial fibrillation and HTN. He has a history of chronic atrial fibrillation. He failed medical therapy with Sotalol so he has been managed with rate control and anticoagulation with Pradaxa.   On follow up today he feels great. He denies any chest pain, SOB, palpitations, dizziness, or edema. Energy level is good. He stays active. No bleeding problems.   Current Outpatient Medications on File Prior to Visit  Medication Sig Dispense Refill  . albuterol (PROAIR HFA) 108 (90 Base) MCG/ACT inhaler Inhale 1-2 puffs into the lungs every 6 (six) hours as needed for wheezing or shortness of breath. 1 Inhaler 0  . benazepril (LOTENSIN) 20 MG tablet Take 1 tablet (20 mg total) by mouth 2 (two) times daily. 180 tablet 1  . dabigatran (PRADAXA) 150 MG CAPS capsule Take 1 capsule (150 mg total) by mouth every 12 (twelve) hours. 180 capsule 3  . diltiazem (DILT-XR) 240 MG 24 hr capsule Take 1 capsule (240 mg total) by mouth daily. 90 capsule 1  . furosemide (LASIX) 20 MG tablet TAKE ONE TABLET DAILY AS NEEDED FOR SWELLING. 30 tablet 0  . Garlic 4627 MG CAPS Take 1 capsule by mouth daily.    . hydrALAZINE (APRESOLINE) 25 MG tablet TAKE ONE TABLET BY MOUTH THREE TIMES DAILY 270 tablet 3  . metoprolol tartrate (LOPRESSOR) 50 MG tablet Take 1 tablet (50 mg total) by mouth 2 (two) times daily. 180 tablet 3  . Multiple Vitamin (MULTIVITAMIN) tablet Take 1 tablet by mouth daily.    . potassium chloride SA (KLOR-CON M20) 20 MEQ tablet Take 1 tablet (20 mEq total) by mouth daily. 90 tablet 1  . Propylene Glycol 0.6 % SOLN Apply 2-3 drops to eye daily as needed (for eyes).    . Saw Palmetto, Serenoa repens, (SAW PALMETTO BERRIES) 540 MG CAPS Take 1,080 mg by mouth daily.      . sildenafil (VIAGRA) 100 MG tablet Take 100 mg by mouth daily as needed for erectile dysfunction.     . tamsulosin (FLOMAX) 0.4  MG CAPS capsule   0   No current facility-administered medications on file prior to visit.     Allergies  Allergen Reactions  . Cipro Iv [Ciprofloxacin]   . Hctz [Hydrochlorothiazide] Photosensitivity    Past Medical History:  Diagnosis Date  . Chronic atrial fibrillation ([facility])   . Chronic dermatitis   . CKD (chronic kidney disease), stage III ([place])    a. Probable based on historical Cr. H/o ARF. Previously saw Dr. Florene Glen.  . Erectile dysfunction   . Hypertension   . Hypokalemia   . Meningioma (HCC)    Probable 13 x 12 mm meningioma in right frontal region  . Thrombocytopenia ([place])    improved    Past Surgical History:  Procedure Laterality Date  . EYE SURGERY     spot removed from cornea  . TEE WITH CARDIOVERSION  12/14/2004   Successful TEE guided electrical cardioversion. -- showed mild mitral insufficiency and trace pulmonic insufficiency   . TOENAIL EXCISION     ingrown toenail removal  . TONSILLECTOMY      Social History   Tobacco Use  Smoking Status Former Smoker  . Packs/day: 1.00  . Years: 10.00  . Pack years: 10.00  . Types: Cigarettes  . Last attempt to quit: 04/25/1970  . Years since quitting: 47.7  Smokeless Tobacco Never  Used    Social History   Substance and Sexual Activity  Alcohol Use No    Family History  Problem Relation Age of Onset  . Heart disease Mother        with pacemaker  . Diabetes Mother   . Asthma Mother   . Heart failure Mother   . Hypertension Father   . Cancer Father        pancreatic cancer  . Diabetes Brother   . Hypertension Sister   . Diabetes Sister     Review of Systems: As noted in history of present illness.  All other systems were reviewed and are negative.  Physical Exam: BP 132/84   Pulse 62   Ht 6\' 2"  (1.88 m)   Wt 218 lb (98.9 kg)   SpO2 96%   BMI 27.99 kg/m  GENERAL:  Well appearing HEENT:  PERRL, EOMI, sclera are clear. Oropharynx is clear. NECK:  No jugular venous distention, carotid  upstroke brisk and symmetric, no bruits, no thyromegaly or adenopathy LUNGS:  Clear to auscultation bilaterally CHEST:  Unremarkable HEART:  IRRR,  PMI not displaced or sustained,S1 and S2 within normal limits, no S3, no S4: no clicks, no rubs, no murmurs ABD:  Soft, nontender. BS +, no masses or bruits. No hepatomegaly, no splenomegaly EXT:  2 + pulses throughout, no edema, no cyanosis no clubbing SKIN:  Warm and dry.  No rashes NEURO:  Alert and oriented x 3. Cranial nerves II through XII intact. PSYCH:  Cognitively intact    LABORATORY DATA: Lab Results  Component Value Date   WBC 6.0 06/21/2017   HGB 14.8 06/21/2017   HCT 44.7 06/21/2017   PLT 161.0 06/21/2017   GLUCOSE 96 12/23/2017   CHOL 107 12/23/2017   TRIG 104.0 12/23/2017   HDL 41.40 12/23/2017   LDLCALC 45 12/23/2017   ALT 12 12/23/2017   AST 15 12/23/2017   NA 142 12/23/2017   K 4.4 12/23/2017   CL 106 12/23/2017   CREATININE 1.29 12/23/2017   BUN 21 12/23/2017   CO2 28 12/23/2017   TSH 1.63 05/20/2015   PSA 3.88 12/23/2017   INR 1.74 (H) 08/27/2014    Ecg today shows AFib with rate 58. Mild nonspecific ST abnormality. I have personally reviewed and interpreted this study.    Assessment / Plan: 1. Atrial fibrillation.  permanent. He is asymptomatic. Rate is well controlled. Continue Pradaxa.  We will continue a long-term strategy of rate control and anticoagulation.   2. Hypertension. Blood pressure is well controlled.   3. Lipid status looks excellent   I will follow up in 1 year.

## 2018-01-15 ENCOUNTER — Other Ambulatory Visit: Payer: Self-pay

## 2018-01-15 ENCOUNTER — Ambulatory Visit: Payer: Medicare Other | Admitting: Cardiology

## 2018-01-15 ENCOUNTER — Encounter: Payer: Self-pay | Admitting: Cardiology

## 2018-01-15 VITALS — BP 132/84 | HR 62 | Ht 74.0 in | Wt 218.0 lb

## 2018-01-15 DIAGNOSIS — I10 Essential (primary) hypertension: Secondary | ICD-10-CM

## 2018-01-15 DIAGNOSIS — I482 Chronic atrial fibrillation, unspecified: Secondary | ICD-10-CM

## 2018-01-15 DIAGNOSIS — N183 Chronic kidney disease, stage 3 unspecified: Secondary | ICD-10-CM

## 2018-01-15 MED ORDER — BENAZEPRIL HCL 20 MG PO TABS: 20.0000 mg | ORAL_TABLET | Freq: Two times a day (BID) | ORAL | 3 refills | Status: DC

## 2018-01-15 MED ORDER — DABIGATRAN ETEXILATE MESYLATE 150 MG PO CAPS: 150.0000 mg | ORAL_CAPSULE | Freq: Two times a day (BID) | ORAL | 3 refills | Status: DC

## 2018-01-15 MED ORDER — METOPROLOL TARTRATE 50 MG PO TABS: 50.0000 mg | ORAL_TABLET | Freq: Two times a day (BID) | ORAL | 3 refills | Status: DC

## 2018-01-15 MED ORDER — DILTIAZEM HCL ER 240 MG PO CP24: 240.0000 mg | ORAL_CAPSULE | Freq: Every day | ORAL | 3 refills | Status: DC

## 2018-01-15 MED ORDER — POTASSIUM CHLORIDE CRYS ER 20 MEQ PO TBCR: 20.0000 meq | EXTENDED_RELEASE_TABLET | Freq: Every day | ORAL | 3 refills | Status: DC

## 2018-01-15 MED ORDER — HYDRALAZINE HCL 25 MG PO TABS: 25.0000 mg | ORAL_TABLET | Freq: Three times a day (TID) | ORAL | 3 refills | Status: DC

## 2018-01-15 MED ORDER — FUROSEMIDE 20 MG PO TABS: ORAL_TABLET | ORAL | 3 refills | Status: DC

## 2018-01-15 NOTE — Patient Instructions (Addendum)
Continue your current therapy  I will see you at one year

## 2018-01-25 IMAGING — CR DG CHEST 2V
2 series · 2 of 2 positions shown · non-contrast
Comparison: 08/27/2014

CLINICAL DATA: Pt arrived by [REDACTED] for cough, fever, bodyaches
since [REDACTED]. Denies n/v/d.

EXAM:
CHEST - 2 VIEW

[chest pa]
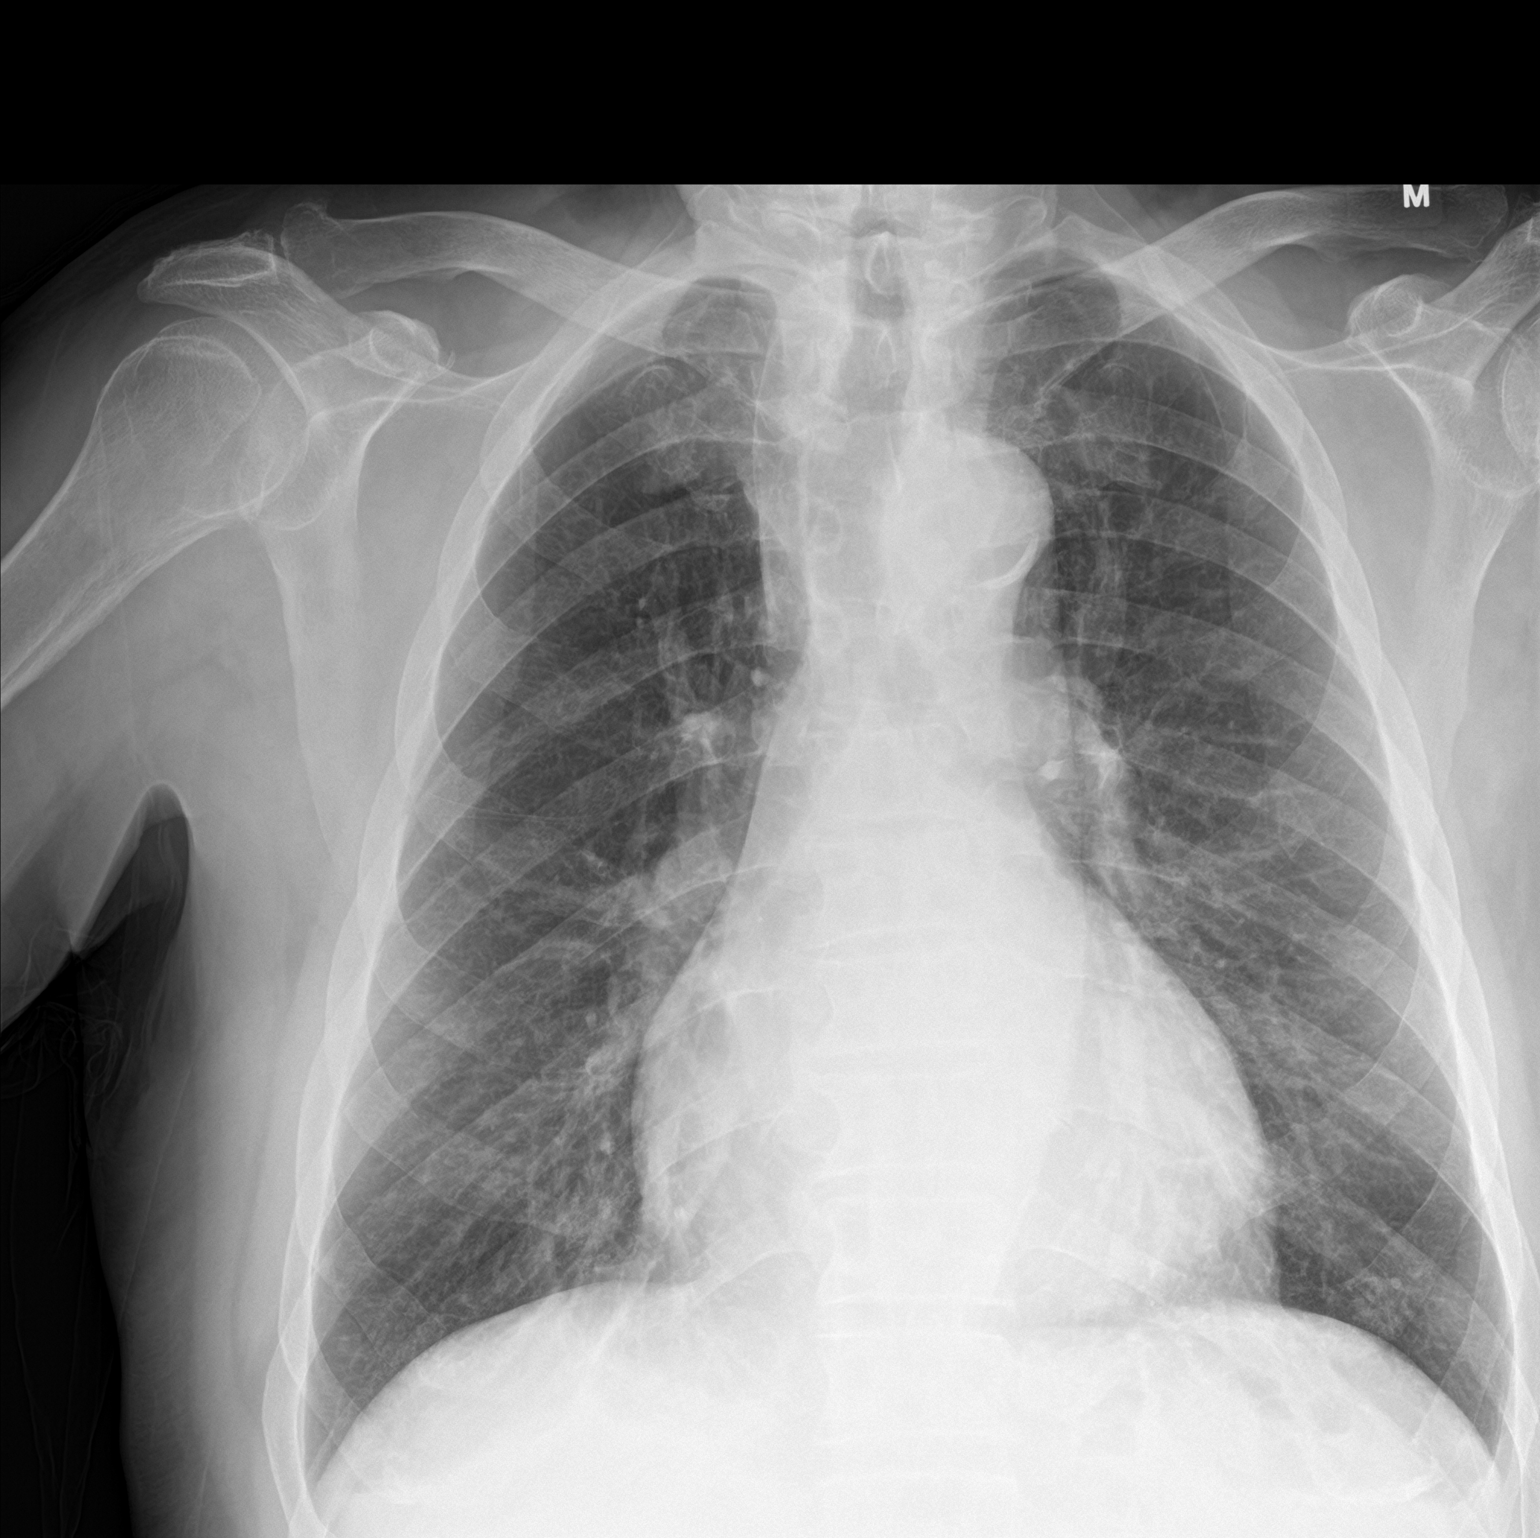

[chest lat]
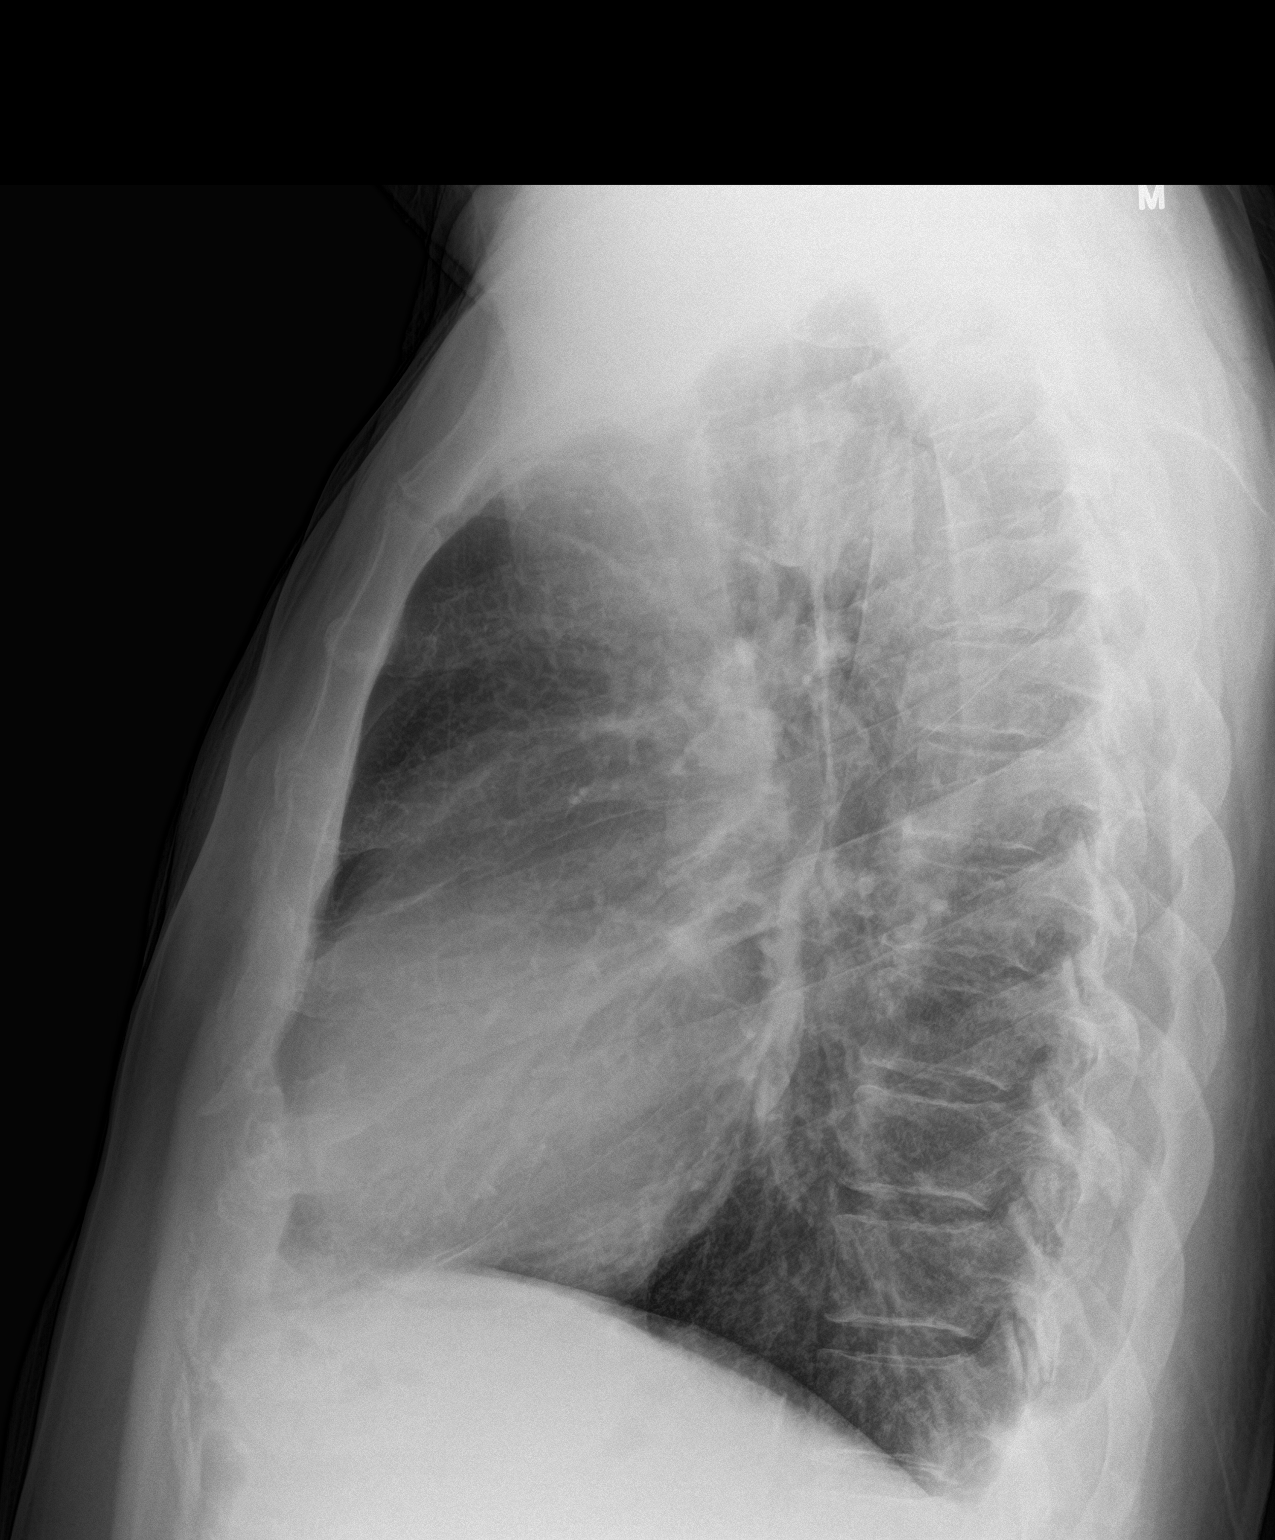

[2 of 2 positions shown; findings below may reference images not displayed]

FINDINGS: Mild cardiomegaly, stable.  Atheromatous aorta.  Lungs are clear.
No effusion.  No pneumothorax.  Azygos fissure.
Visualized skeletal structures are unremarkable.
IMPRESSION: No acute cardiopulmonary disease.

## 2018-02-04 ENCOUNTER — Telehealth: Payer: Self-pay | Admitting: Nurse Practitioner

## 2018-02-04 NOTE — Telephone Encounter (Signed)
[facility] rep stated potassium is ready to be pick up since Dr. Martinique sent in on 01/2018. Pt is aware to call [facility] and as for a refill.

## 2018-02-04 NOTE — Telephone Encounter (Signed)
Copied from [id number] [phone] [id number]. Topic: Quick Communication - Rx Refill/Question >> Feb 04, 2018 12:26 PM Synthia Innocent wrote: Medication: potassium chloride SA (KLOR-CON M20) 20 MEQ tablet  90 day supply  Has the patient contacted their pharmacy? Yes.   (Agent: If no, request that the patient contact the pharmacy for the refill.) (Agent: If yes, when and what did the pharmacy advise?)  Preferred Pharmacy (with phone number or street name): [facility] on [address]   Agent: Please be advised that RX refills may take up to 3 business days. We ask that you follow-up with your pharmacy.

## 2018-02-04 NOTE — Telephone Encounter (Signed)
Refill of Klor-Con  By historical provider  LOV 12/23/17 C. Nche  LRF 01/15/18  #90  3 refill  Pt states never received refill from Dr. Martinique 01/15/18 Has 4 tabs left.

## 2018-02-24 ENCOUNTER — Encounter: Payer: Self-pay | Admitting: Nurse Practitioner

## 2018-02-24 ENCOUNTER — Ambulatory Visit (INDEPENDENT_AMBULATORY_CARE_PROVIDER_SITE_OTHER): Payer: Medicare Other | Admitting: Nurse Practitioner

## 2018-02-24 VITALS — BP 130/80 | HR 55 | Temp 97.7°F | Ht 74.0 in | Wt 218.0 lb

## 2018-02-24 DIAGNOSIS — N183 Chronic kidney disease, stage 3 unspecified: Secondary | ICD-10-CM

## 2018-02-24 DIAGNOSIS — I10 Essential (primary) hypertension: Secondary | ICD-10-CM | POA: Diagnosis not present

## 2018-02-24 DIAGNOSIS — E782 Mixed hyperlipidemia: Secondary | ICD-10-CM

## 2018-02-24 DIAGNOSIS — A6920 Lyme disease, unspecified: Secondary | ICD-10-CM | POA: Diagnosis not present

## 2018-02-24 NOTE — Patient Instructions (Addendum)
Please bring copy of  Home wellness visit summary.   medications were refilled by Dr. Martinique for 1year.  Maintain appt with Dr. Martinique.

## 2018-02-24 NOTE — Progress Notes (Signed)
Subjective:  Patient ID: [name], male    DOB: Aug 25, 1940  Age: 77 y.o. MRN: [id number]  CC: Follow-up (2 mo fu/fasting/tick bit is better/ refill on diltiazem?)  HPI  AWV done 12/11/2017 by [facility]  Nurse.  Tick Bite and rash: Resolved with completion of oral abx x 14days. Denies any adverse effects from medication.  HTN: Stable. Controlled with hydralazine, and benazepril Managed by Dr. Martinique. BP Readings from Last 3 Encounters:  02/24/18 130/80  01/15/18 132/84  12/23/17 134/88   CKD, stage 3: Stable with Gfr 57.26 Use of furosemide No protein in urine.  A-Fib: Persistent with controlled rate with metoprolol and diltiazem Managed and monitored by Dr. Martinique. Use of pradaxa.  BPH without LUTs: Continuous use of flomax.  Reviewed past Medical, Social and Family history today.   Outpatient Medications Prior to Visit  Medication Sig Dispense Refill  . albuterol (PROAIR HFA) 108 (90 Base) MCG/ACT inhaler Inhale 1-2 puffs into the lungs every 6 (six) hours as needed for wheezing or shortness of breath. 1 Inhaler 0  . benazepril (LOTENSIN) 20 MG tablet Take 1 tablet (20 mg total) by mouth 2 (two) times daily. 180 tablet 3  . dabigatran (PRADAXA) 150 MG CAPS capsule Take 1 capsule (150 mg total) by mouth every 12 (twelve) hours. 180 capsule 3  . diltiazem (DILT-XR) 240 MG 24 hr capsule Take 1 capsule (240 mg total) by mouth daily. 90 capsule 3  . furosemide (LASIX) 20 MG tablet Take 20 mg daily if needed for swelling 90 tablet 3  . Garlic 6063 MG CAPS Take 1 capsule by mouth daily.    . hydrALAZINE (APRESOLINE) 25 MG tablet Take 1 tablet (25 mg total) by mouth 3 (three) times daily. 270 tablet 3  . metoprolol tartrate (LOPRESSOR) 50 MG tablet Take 1 tablet (50 mg total) by mouth 2 (two) times daily. 180 tablet 3  . Multiple Vitamin (MULTIVITAMIN) tablet Take 1 tablet by mouth daily.    . potassium chloride SA (KLOR-CON M20) 20 MEQ tablet Take 1 tablet (20 mEq  total) by mouth daily. 90 tablet 3  . Propylene Glycol 0.6 % SOLN Apply 2-3 drops to eye daily as needed (for eyes).    . Saw Palmetto, Serenoa repens, (SAW PALMETTO BERRIES) 540 MG CAPS Take 1,080 mg by mouth daily.      . sildenafil (VIAGRA) 100 MG tablet Take 100 mg by mouth daily as needed for erectile dysfunction.     . tamsulosin (FLOMAX) 0.4 MG CAPS capsule   0   No facility-administered medications prior to visit.     ROS See HPI  Objective:  BP 130/80   Pulse (!) 55   Temp 97.7 F (36.5 C) (Oral)   Ht 6\' 2"  (1.88 m)   Wt 218 lb (98.9 kg)   SpO2 99%   BMI 27.99 kg/m   BP Readings from Last 3 Encounters:  02/24/18 130/80  01/15/18 132/84  12/23/17 134/88    Wt Readings from Last 3 Encounters:  02/24/18 218 lb (98.9 kg)  01/15/18 218 lb (98.9 kg)  12/23/17 217 lb (98.4 kg)    Physical Exam  Constitutional: He is oriented to person, place, and time. No distress.  Cardiovascular: Normal rate. An irregularly irregular rhythm present.  Pulmonary/Chest: Effort normal and breath sounds normal.  Musculoskeletal: He exhibits no edema.  Neurological: He is alert and oriented to person, place, and time.  Skin: Skin is warm and dry. No rash noted. No  erythema.  Vitals reviewed.   Lab Results  Component Value Date   WBC 6.0 06/21/2017   HGB 14.8 06/21/2017   HCT 44.7 06/21/2017   PLT 161.0 06/21/2017   GLUCOSE 96 12/23/2017   CHOL 107 12/23/2017   TRIG 104.0 12/23/2017   HDL 41.40 12/23/2017   LDLCALC 45 12/23/2017   ALT 12 12/23/2017   AST 15 12/23/2017   NA 142 12/23/2017   K 4.4 12/23/2017   CL 106 12/23/2017   CREATININE 1.29 12/23/2017   BUN 21 12/23/2017   CO2 28 12/23/2017   TSH 1.63 05/20/2015   PSA 3.88 12/23/2017   INR 1.74 (H) 08/27/2014   Dg Chest 2 View  Result Date: 10/31/2015 CLINICAL DATA:  [facility] for cough, fever, bodyaches since Friday. Denies n/v/d. EXAM: CHEST - 2 VIEW COMPARISON:  08/27/2014 FINDINGS: Mild cardiomegaly,  stable.  Atheromatous aorta.  Lungs are clear. No effusion.  No pneumothorax.  Azygos fissure. Visualized skeletal structures are unremarkable. IMPRESSION: No acute cardiopulmonary disease. Electronically Signed   By: Lucrezia Europe M.D.   On: 10/31/2015 18:45    Assessment & Plan:   [name] was seen today for follow-up.  Diagnoses and all orders for this visit:  Essential hypertension  CKD (chronic kidney disease), stage III ([place])  Mixed hyperlipidemia   I am having [name] maintain his sildenafil, Saw Palmetto Berries, multivitamin, Garlic, Propylene Glycol, tamsulosin, albuterol, benazepril, metoprolol tartrate, diltiazem, furosemide, potassium chloride SA, hydrALAZINE, and dabigatran.  No orders of the defined types were placed in this encounter.   Follow-up: Return in about 1 year (around 02/25/2019) for HTN and , hyperlipidemia (fasting).  Samuel Lacy, NP

## 2018-02-28 ENCOUNTER — Other Ambulatory Visit: Payer: Self-pay

## 2018-02-28 ENCOUNTER — Inpatient Hospital Stay (HOSPITAL_COMMUNITY)
Admission: EM | Admit: 2018-02-28 | Discharge: 2018-03-03 | DRG: 378 | Disposition: A | Discharge status: Home / Self Care | Payer: Medicare Other | Attending: Family Medicine | Admitting: Family Medicine

## 2018-02-28 ENCOUNTER — Encounter (HOSPITAL_COMMUNITY): Payer: Self-pay | Admitting: Emergency Medicine

## 2018-02-28 DIAGNOSIS — D631 Anemia in chronic kidney disease: Secondary | ICD-10-CM | POA: Diagnosis present

## 2018-02-28 DIAGNOSIS — K921 Melena: Secondary | ICD-10-CM | POA: Diagnosis not present

## 2018-02-28 DIAGNOSIS — K922 Gastrointestinal hemorrhage, unspecified: Secondary | ICD-10-CM

## 2018-02-28 DIAGNOSIS — E785 Hyperlipidemia, unspecified: Secondary | ICD-10-CM | POA: Diagnosis present

## 2018-02-28 DIAGNOSIS — Z8249 Family history of ischemic heart disease and other diseases of the circulatory system: Secondary | ICD-10-CM

## 2018-02-28 DIAGNOSIS — Z859 Personal history of malignant neoplasm, unspecified: Secondary | ICD-10-CM

## 2018-02-28 DIAGNOSIS — K3182 Dieulafoy lesion (hemorrhagic) of stomach and duodenum: Secondary | ICD-10-CM | POA: Diagnosis not present

## 2018-02-28 DIAGNOSIS — Z833 Family history of diabetes mellitus: Secondary | ICD-10-CM

## 2018-02-28 DIAGNOSIS — Z825 Family history of asthma and other chronic lower respiratory diseases: Secondary | ICD-10-CM

## 2018-02-28 DIAGNOSIS — I482 Chronic atrial fibrillation, unspecified: Secondary | ICD-10-CM | POA: Diagnosis present

## 2018-02-28 DIAGNOSIS — Z881 Allergy status to other antibiotic agents status: Secondary | ICD-10-CM

## 2018-02-28 DIAGNOSIS — Z87891 Personal history of nicotine dependence: Secondary | ICD-10-CM

## 2018-02-28 DIAGNOSIS — IMO0002 Reserved for concepts with insufficient information to code with codable children: Secondary | ICD-10-CM

## 2018-02-28 DIAGNOSIS — Z125 Encounter for screening for malignant neoplasm of prostate: Secondary | ICD-10-CM

## 2018-02-28 DIAGNOSIS — E876 Hypokalemia: Secondary | ICD-10-CM | POA: Diagnosis not present

## 2018-02-28 DIAGNOSIS — N183 Chronic kidney disease, stage 3 (moderate): Secondary | ICD-10-CM | POA: Diagnosis present

## 2018-02-28 DIAGNOSIS — N400 Enlarged prostate without lower urinary tract symptoms: Secondary | ICD-10-CM | POA: Diagnosis present

## 2018-02-28 DIAGNOSIS — Z86011 Personal history of benign neoplasm of the brain: Secondary | ICD-10-CM

## 2018-02-28 DIAGNOSIS — Z888 Allergy status to other drugs, medicaments and biological substances status: Secondary | ICD-10-CM

## 2018-02-28 DIAGNOSIS — D649 Anemia, unspecified: Secondary | ICD-10-CM | POA: Diagnosis present

## 2018-02-28 DIAGNOSIS — I129 Hypertensive chronic kidney disease with stage 1 through stage 4 chronic kidney disease, or unspecified chronic kidney disease: Secondary | ICD-10-CM | POA: Diagnosis present

## 2018-02-28 DIAGNOSIS — Z7901 Long term (current) use of anticoagulants: Secondary | ICD-10-CM

## 2018-02-28 DIAGNOSIS — L309 Dermatitis, unspecified: Secondary | ICD-10-CM | POA: Diagnosis present

## 2018-02-28 DIAGNOSIS — D696 Thrombocytopenia, unspecified: Secondary | ICD-10-CM

## 2018-02-28 DIAGNOSIS — D62 Acute posthemorrhagic anemia: Secondary | ICD-10-CM | POA: Diagnosis present

## 2018-02-28 DIAGNOSIS — I10 Essential (primary) hypertension: Secondary | ICD-10-CM | POA: Diagnosis present

## 2018-02-28 DIAGNOSIS — D6959 Other secondary thrombocytopenia: Secondary | ICD-10-CM | POA: Diagnosis present

## 2018-02-28 DIAGNOSIS — R972 Elevated prostate specific antigen [PSA]: Secondary | ICD-10-CM

## 2018-02-28 HISTORY — DX: Gastrointestinal hemorrhage, unspecified: K92.2

## 2018-02-28 LAB — COMPREHENSIVE METABOLIC PANEL
ALBUMIN: 3.6 g/dL (ref 3.5–5.0)
ALT: 13 U/L (ref 0–44)
AST: 29 U/L (ref 15–41)
Alkaline Phosphatase: 60 U/L (ref 38–126)
Anion gap: 7 (ref 5–15)
BUN: 52 mg/dL — AB (ref 8–23)
CO2: 24 mmol/L (ref 22–32)
Calcium: 9 mg/dL (ref 8.9–10.3)
Chloride: 112 mmol/L — ABNORMAL HIGH (ref 98–111)
Creatinine, Ser: 1.41 mg/dL — ABNORMAL HIGH (ref 0.61–1.24)
GFR calc Af Amer: 54 mL/min — ABNORMAL LOW (ref >60)
GFR, EST NON AFRICAN AMERICAN: 46 mL/min — AB (ref >60)
Glucose, Bld: 158 mg/dL — ABNORMAL HIGH (ref 70–99)
POTASSIUM: 4.6 mmol/L (ref 3.5–5.1)
SODIUM: 143 mmol/L (ref 135–145)
Total Bilirubin: 1.8 mg/dL — ABNORMAL HIGH (ref 0.3–1.2)
Total Protein: 6.2 g/dL — ABNORMAL LOW (ref 6.5–8.1)

## 2018-02-28 LAB — CBC
HCT: 32.1 % — ABNORMAL LOW (ref 39.0–52.0)
HCT: 29.2 % — ABNORMAL LOW (ref 39.0–52.0)
HEMATOCRIT: 38.3 % — AB (ref 39.0–52.0)
HEMOGLOBIN: 9.4 g/dL — AB (ref 13.0–17.0)
Hemoglobin: 12.4 g/dL — ABNORMAL LOW (ref 13.0–17.0)
Hemoglobin: 10.5 g/dL — ABNORMAL LOW (ref 13.0–17.0)
MCH: 31.3 pg (ref 26.0–34.0)
MCH: 31.3 pg (ref 26.0–34.0)
MCH: 30.7 pg (ref 26.0–34.0)
MCHC: 32.4 g/dL (ref 30.0–36.0)
MCHC: 32.7 g/dL (ref 30.0–36.0)
MCHC: 32.2 g/dL (ref 30.0–36.0)
MCV: 96.7 fL (ref 78.0–100.0)
MCV: 95.8 fL (ref 78.0–100.0)
MCV: 95.4 fL (ref 78.0–100.0)
PLATELETS: 130 10*3/uL — AB (ref 150–400)
Platelets: 151 10*3/uL (ref 150–400)
Platelets: 140 10*3/uL — ABNORMAL LOW (ref 150–400)
RBC: 3.96 MIL/uL — ABNORMAL LOW (ref 4.22–5.81)
RBC: 3.35 MIL/uL — ABNORMAL LOW (ref 4.22–5.81)
RBC: 3.06 MIL/uL — ABNORMAL LOW (ref 4.22–5.81)
RDW: 14.3 % (ref 11.5–15.5)
RDW: 14.4 % (ref 11.5–15.5)
RDW: 14.5 % (ref 11.5–15.5)
WBC: 10.8 10*3/uL — AB (ref 4.0–10.5)
WBC: 10.1 10*3/uL (ref 4.0–10.5)
WBC: 11.7 10*3/uL — ABNORMAL HIGH (ref 4.0–10.5)

## 2018-02-28 LAB — PROTIME-INR
INR: 1.82
Prothrombin Time: 20.9 seconds — ABNORMAL HIGH (ref 11.4–15.2)

## 2018-02-28 LAB — URINALYSIS, ROUTINE W REFLEX MICROSCOPIC
BILIRUBIN URINE: NEGATIVE
GLUCOSE, UA: NEGATIVE mg/dL
HGB URINE DIPSTICK: NEGATIVE
Ketones, ur: 5 mg/dL — AB
Leukocytes, UA: NEGATIVE
Nitrite: NEGATIVE
PH: 5 (ref 5.0–8.0)
Protein, ur: NEGATIVE mg/dL
SPECIFIC GRAVITY, URINE: 1.013 (ref 1.005–1.030)

## 2018-02-28 LAB — ABO/RH: ABO/RH(D): O POS

## 2018-02-28 LAB — POC OCCULT BLOOD, ED: FECAL OCCULT BLD: POSITIVE — AB

## 2018-02-28 MED ORDER — ACETAMINOPHEN 650 MG RE SUPP: 650.0000 mg | Freq: Four times a day (QID) | RECTAL | Status: DC | PRN

## 2018-02-28 MED ORDER — HYDROCODONE-ACETAMINOPHEN 5-325 MG PO TABS: 1.0000 | ORAL_TABLET | ORAL | Status: DC | PRN

## 2018-02-28 MED ORDER — SENNOSIDES-DOCUSATE SODIUM 8.6-50 MG PO TABS: 1.0000 | ORAL_TABLET | Freq: Every evening | ORAL | Status: DC | PRN

## 2018-02-28 MED ORDER — ONDANSETRON HCL 4 MG PO TABS: 4.0000 mg | ORAL_TABLET | Freq: Four times a day (QID) | ORAL | Status: DC | PRN

## 2018-02-28 MED ORDER — DILTIAZEM HCL ER COATED BEADS 120 MG PO CP24
240.0000 mg | ORAL_CAPSULE | Freq: Every day | ORAL | Status: DC
  Administered 2018-03-01 – 2018-03-03 (×3): 240 mg via ORAL
  Filled 2018-02-28 (×3): qty 2

## 2018-02-28 MED ORDER — ALBUTEROL SULFATE (2.5 MG/3ML) 0.083% IN NEBU: 2.5000 mg | INHALATION_SOLUTION | Freq: Four times a day (QID) | RESPIRATORY_TRACT | Status: DC | PRN

## 2018-02-28 MED ORDER — PANTOPRAZOLE SODIUM 40 MG IV SOLR
40.0000 mg | Freq: Two times a day (BID) | INTRAVENOUS | Status: DC
  Administered 2018-02-28 – 2018-03-01 (×2): 40 mg via INTRAVENOUS
  Filled 2018-02-28 (×2): qty 40

## 2018-02-28 MED ORDER — HYDRALAZINE HCL 25 MG PO TABS
25.0000 mg | ORAL_TABLET | Freq: Three times a day (TID) | ORAL | Status: DC
  Administered 2018-03-01 – 2018-03-03 (×5): 25 mg via ORAL
  Filled 2018-02-28 (×7): qty 1

## 2018-02-28 MED ORDER — SODIUM CHLORIDE 0.9 % IV SOLN
INTRAVENOUS | Status: DC
  Administered 2018-02-28 (×2): via INTRAVENOUS

## 2018-02-28 MED ORDER — FUROSEMIDE 20 MG PO TABS
20.0000 mg | ORAL_TABLET | Freq: Every day | ORAL | Status: DC
  Filled 2018-02-28: qty 1

## 2018-02-28 MED ORDER — BISACODYL 10 MG RE SUPP: 10.0000 mg | Freq: Every day | RECTAL | Status: DC | PRN

## 2018-02-28 MED ORDER — ALBUTEROL SULFATE HFA 108 (90 BASE) MCG/ACT IN AERS: 1.0000 | INHALATION_SPRAY | Freq: Four times a day (QID) | RESPIRATORY_TRACT | Status: DC | PRN

## 2018-02-28 MED ORDER — ACETAMINOPHEN 325 MG PO TABS: 650.0000 mg | ORAL_TABLET | Freq: Four times a day (QID) | ORAL | Status: DC | PRN

## 2018-02-28 MED ORDER — METOPROLOL TARTRATE 50 MG PO TABS
50.0000 mg | ORAL_TABLET | Freq: Two times a day (BID) | ORAL | Status: DC
  Administered 2018-02-28 – 2018-03-03 (×7): 50 mg via ORAL
  Filled 2018-02-28: qty 1
  Filled 2018-02-28: qty 2
  Filled 2018-02-28 (×5): qty 1

## 2018-02-28 MED ORDER — PANTOPRAZOLE SODIUM 40 MG IV SOLR
40.0000 mg | Freq: Once | INTRAVENOUS | Status: AC
  Administered 2018-02-28: 40 mg via INTRAVENOUS
  Filled 2018-02-28: qty 40

## 2018-02-28 MED ORDER — ONDANSETRON HCL 4 MG/2ML IJ SOLN: 4.0000 mg | Freq: Four times a day (QID) | INTRAMUSCULAR | Status: DC | PRN

## 2018-02-28 MED ORDER — SODIUM CHLORIDE 0.9 % IV BOLUS
1000.0000 mL | Freq: Once | INTRAVENOUS | Status: AC
  Administered 2018-02-28: 1000 mL via INTRAVENOUS

## 2018-02-28 NOTE — ED Notes (Signed)
Care handoff to Almyra Free, [place]

## 2018-02-28 NOTE — Consult Note (Signed)
[facility] CONSULT Reason for consult: Hematemesis Referring Physician: Emergency room.  PCP: Charlene Brooke NP Cardiologist: Dr. Peter Martinique  [name] is an 77 y.o. male.  HPI: He sees Dr. Peter Martinique for chronic atrial fibrillation.  He is on chronic Pradaxa.  Yesterday he had some cramping abdominal pain stools and had some bright blood wiping.  He then had a couple of dark stools and felt better.  Later yesterday evening he felt nauseated and vomited up some bright red blood.  He previously had felt nauseated and vomited up some food that he did eat.  He did take his Pradaxa last night but did not take it this morning since she has had issues with bleeding and came to the emergency room.  Hemoglobin 12.4.  He has had previous surgery for meningioma and has hypertension.  He has had prior cardiac.  Past Medical History:  Diagnosis Date  . Chronic atrial fibrillation ([place])   . Chronic dermatitis   . CKD (chronic kidney disease), stage III ([place])    a. Probable based on historical Cr. H/o ARF. Previously saw Dr. Florene Glen.  . Erectile dysfunction   . Hypertension   . Hypokalemia   . Meningioma (HCC)    Probable 13 x 12 mm meningioma in right frontal region  . Thrombocytopenia ([place])    improved    Past Surgical History:  Procedure Laterality Date  . EYE SURGERY     spot removed from cornea  . TEE WITH CARDIOVERSION  12/14/2004   Successful TEE guided electrical cardioversion. -- showed mild mitral insufficiency and trace pulmonic insufficiency   . TOENAIL EXCISION     ingrown toenail removal  . TONSILLECTOMY      Family History  Problem Relation Age of Onset  . Heart disease Mother        with pacemaker  . Diabetes Mother   . Asthma Mother   . Heart failure Mother   . Hypertension Father   . Cancer Father        pancreatic cancer  . Diabetes Brother   . Hypertension Sister   . Diabetes Sister     Social History:  reports that he quit smoking about 47  years ago. His smoking use included cigarettes. He has a 10.00 pack-year smoking history. He has never used smokeless tobacco. He reports that he does not drink alcohol or use drugs.  Allergies:  Allergies  Allergen Reactions  . Hctz [Hydrochlorothiazide] Photosensitivity  . Cipro Iv [Ciprofloxacin] Hives and Rash    Caused red streaks up the arm    Medications; Prior to Admission medications   Medication Sig Start Date End Date Taking? Authorizing Provider  albuterol (PROAIR HFA) 108 (90 Base) MCG/ACT inhaler Inhale 1-2 puffs into the lungs every 6 (six) hours as needed for wheezing or shortness of breath. 08/23/17  Yes Nche, Charlene Brooke, NP  benazepril (LOTENSIN) 20 MG tablet Take 1 tablet (20 mg total) by mouth 2 (two) times daily. 01/15/18  Yes Martinique, Peter M, MD  Cranberry 1000 MG CAPS Take 1,000 mg by mouth daily.   Yes [provider]  dabigatran (PRADAXA) 150 MG CAPS capsule Take 1 capsule (150 mg total) by mouth every 12 (twelve) hours. 01/15/18  Yes Martinique, Peter M, MD  diltiazem (DILT-XR) 240 MG 24 hr capsule Take 1 capsule (240 mg total) by mouth daily. 01/15/18  Yes Martinique, Peter M, MD  furosemide (LASIX) 20 MG tablet Take 20 mg daily if needed for swelling  01/15/18  Yes Martinique, Peter M, MD  Garlic 5361 MG CAPS Take 1 capsule by mouth daily.   Yes [provider]  hydrALAZINE (APRESOLINE) 25 MG tablet Take 1 tablet (25 mg total) by mouth 3 (three) times daily. 01/15/18  Yes Martinique, Peter M, MD  metoprolol tartrate (LOPRESSOR) 50 MG tablet Take 1 tablet (50 mg total) by mouth 2 (two) times daily. 01/15/18  Yes Martinique, Peter M, MD  Multiple Vitamin (MULTIVITAMIN) tablet Take 1 tablet by mouth daily.   Yes [provider]  potassium chloride SA (KLOR-CON M20) 20 MEQ tablet Take 1 tablet (20 mEq total) by mouth daily. 01/15/18  Yes Martinique, Peter M, MD  sildenafil (VIAGRA) 100 MG tablet Take 100 mg by mouth daily as needed for erectile dysfunction.    Yes  [provider]  tamsulosin (FLOMAX) 0.4 MG CAPS capsule Take 0.4 mg by mouth daily after supper.  07/03/17  Yes [provider]   . metoprolol tartrate  50 mg Oral BID  . pantoprazole  40 mg Intravenous Q12H   PRN Meds bisacodyl, HYDROcodone-acetaminophen, ondansetron **OR** ondansetron (ZOFRAN) IV, senna-docusate Results for orders placed or performed during the hospital encounter of 02/28/18 (from the past 48 hour(s))  Comprehensive metabolic panel     Status: Abnormal   Collection Time: 02/28/18 10:16 AM  Result Value Ref Range   Sodium 143 135 - 145 mmol/L   Potassium 4.6 3.5 - 5.1 mmol/L    Comment: SLIGHT HEMOLYSIS   Chloride 112 (H) 98 - 111 mmol/L   CO2 24 22 - 32 mmol/L   Glucose, Bld 158 (H) 70 - 99 mg/dL   BUN 52 (H) 8 - 23 mg/dL   Creatinine, Ser 1.41 (H) 0.61 - 1.24 mg/dL   Calcium 9.0 8.9 - 10.3 mg/dL   Total Protein 6.2 (L) 6.5 - 8.1 g/dL   Albumin 3.6 3.5 - 5.0 g/dL   AST 29 15 - 41 U/L   ALT 13 0 - 44 U/L   Alkaline Phosphatase 60 38 - 126 U/L   Total Bilirubin 1.8 (H) 0.3 - 1.2 mg/dL   GFR calc non Af Amer 46 (L) >60 mL/min   GFR calc Af Amer 54 (L) >60 mL/min    Comment: (NOTE) The eGFR has been calculated using the CKD EPI equation. This calculation has not been validated in all clinical situations. eGFR's persistently <60 mL/min signify possible Chronic Kidney Disease.    Anion gap 7 5 - 15    Comment: Performed at [facility] [address], [place], [place] [address]  CBC     Status: Abnormal   Collection Time: 02/28/18 10:16 AM  Result Value Ref Range   WBC 10.8 (H) 4.0 - 10.5 K/uL   RBC 3.96 (L) 4.22 - 5.81 MIL/uL   Hemoglobin 12.4 (L) 13.0 - 17.0 g/dL   HCT 38.3 (L) 39.0 - 52.0 %   MCV 96.7 78.0 - 100.0 fL   MCH 31.3 26.0 - 34.0 pg   MCHC 32.4 30.0 - 36.0 g/dL   RDW 14.3 11.5 - 15.5 %   Platelets 151 150 - 400 K/uL    Comment: Performed at [facility], [place] [address], [place], [place] [address]  Type and  screen [place]     Status: None   Collection Time: 02/28/18 10:16 AM  Result Value Ref Range   ABO/RH(D) O POS    Antibody Screen NEG    Sample Expiration  03/03/2018 Performed at [facility] [address], [place], [place] [address]   ABO/Rh     Status: None   Collection Time: 02/28/18 10:16 AM  Result Value Ref Range   ABO/RH(D)      O POS Performed at [facility] [address], [place], [place] [address]   POC occult blood, ED     Status: Abnormal   Collection Time: 02/28/18 11:36 AM  Result Value Ref Range   Fecal Occult Bld POSITIVE (A) NEGATIVE  Protime-INR     Status: Abnormal   Collection Time: 02/28/18 11:39 AM  Result Value Ref Range   Prothrombin Time 20.9 (H) 11.4 - 15.2 seconds   INR 1.82     Comment: Performed at [facility], [place] [address] [place], [place] [address]    No results found. ROS: Constitutional: No chronic abdominal pain HEENT: Negative Cardiovascular: Has been in A. fib in spite of medical therapy and cardioversion.  According to Dr. Doug Sou note this was permanent and he would remain on Pradaxa Respiratory: Negative GI: Discovered that he has had prior colonoscopy by Dr. Deatra Ina 9 years ago GU: Does have CKD followed by nephrologist  Musculoskeletal: Negative Neuro/Psychiatric: Negative  Endocrine/Heme: Negative            Blood pressure (!) 138/94, pulse 96, temperature 98.3 F (36.8 C), temperature source Oral, resp. rate (!) 25, height '6\' 3"'  (1.905 m), weight 98.9 kg (218 lb), SpO2 98 %.  Physical exam:   General--Pleasant male no acute distress   ENT--nonicteric Neck--without lymphadenopathy Heart--irregular Lungs--clear Abdomen--soft and nontender Psych--A+O   Assessment: 1.  Hematemesis.  Patient's hemoglobin is stable.  His treatment he was experienced earlier.  He did have some bright red blood wiping likely hemorrhoids but he may need his colon evaluated as well  since it has been 9 years since he had colonoscopy 2.  Atrial fibrillation.  This is permanent and he has failed cardioversion and medical therapy.  To be on lifetime anticoagulation 3.  History of meningioma right frontal lobe.  Plan: 1.  Patient need EGD possible colonoscopy.  He is only been off of his Pradaxa since last night.  He will likely need the EGD tomorrow since he did have a history of hematemesis.  Have discussed EGD with patient.  Patient has seen Dr. Pasty Arch past discussed with Dr. Fuller Plan. [facility] will proceed with EGD when off the pradaxa for longer period.    Nancy Fetter 02/28/2018, 2:29 PM   This note was created using voice recognition software and minor errors may Have occurred unintentionally. Pager: [phone] If no answer or after hours call [phone]

## 2018-02-28 NOTE — H&P (Signed)
History and Physical    [name] PRF:[id number] DOB: 1941/03/13 DOA: 02/28/2018   PCP: Flossie Buffy, NP   Patient coming from:  Home    Chief Complaint: Rectal bleeding  HPI: [name] is a 77 y.o. male with chronic atrial fibrillation on Pradaxa, CKD stage III, hypertension, history of meningioma, presenting to the ED with acute onset of rectal bleeding.  The patient reported severe crampy abdominal pain yesterday, followed by one episode of bright red blood per rectum, as well as generalized weakness.  After that the first episode of hematochezia, the patient noted several dark stools, which continued this morning.  In addition, he had one episode of bright red emeses yesterday (which he thought he was ketchup), and one episode of hematemesis today.  His last Pradaxa was yesterday, he did not wish to take it today, suspecting that this was related to the medicine. Denies fevers, chills, night sweats, vision changes, or mucositis. Denies any respiratory complaints. Denies any chest pain or palpitations.  He denies any syncope, presyncope or dizziness.  Denies lower extremity swelling. Denies nausea, heartburn or change in bowel habits.Appetite is normal and he denies any unexplained weight loss.  He denies any food poisoning, recent travels outside of the country, or any new vitamins, herbal products, or any other over-the-counter agents.  He did have recently stopped doxycycline, for the treatment of fatigue, about 1 months ago.. Denies any dysuria.  Denies any other bleeding issues such as epistaxis hematuria or epistaxis ambulating without difficulty.  He denies any tobacco, alcohol or recreational drug use.  He denies any history of cancer or family history of cancer.  No confusion is reported. Last colonoscopy and endoscopy was in 2010, which was negative and was done for screening purposes.  ED Course:  BP (!) 152/86   Pulse (!) 124   Temp 98.3 F (36.8 C) (Oral)   Resp (!)  21   Ht 6\' 3"  (1.905 m)   Wt 98.9 kg (218 lb)   SpO2 98%   BMI 27.25 kg/m   Sodium 143, potassium 4.6, bicarb 24, glucose 158, BUN 52, creatinine 1.41,  alkaline phosphatase normal at 60, albumin 3.6, AST 29, ALT 13, total bilirubin 1.8,  white count 10.8, hemoglobin 12.4, platelets 151. PT 20.9, INR 1.82 Hemoccult positive x2 Last 2D echo in August 2014, EF 55 to 93%, normal systolic  Review of Systems:  As per HPI otherwise all other systems reviewed and are negative  Past Medical History:  Diagnosis Date  . Chronic atrial fibrillation ([place])   . Chronic dermatitis   . CKD (chronic kidney disease), stage III ([place])    a. Probable based on historical Cr. H/o ARF. Previously saw Dr. Florene Glen.  . Erectile dysfunction   . Hypertension   . Hypokalemia   . Meningioma (HCC)    Probable 13 x 12 mm meningioma in right frontal region  . Thrombocytopenia ([place])    improved    Past Surgical History:  Procedure Laterality Date  . EYE SURGERY     spot removed from cornea  . TEE WITH CARDIOVERSION  12/14/2004   Successful TEE guided electrical cardioversion. -- showed mild mitral insufficiency and trace pulmonic insufficiency   . TOENAIL EXCISION     ingrown toenail removal  . TONSILLECTOMY      Social History Social History   Socioeconomic History  . Marital status: Married    Spouse name: Not on file  . Number of children: 2  .  Years of education: Not on file  . Highest education level: Not on file  Occupational History  . Occupation: managed salvage yard    Employer: RETIRED  Social Needs  . Financial resource strain: Not on file  . Food insecurity:    Worry: Not on file    Inability: Not on file  . Transportation needs:    Medical: Not on file    Non-medical: Not on file  Tobacco Use  . Smoking status: Former Smoker    Packs/day: 1.00    Years: 10.00    Pack years: 10.00    Types: Cigarettes    Last attempt to quit: 04/25/1970    Years since quitting: 47.8  .  Smokeless tobacco: Never Used  Substance and Sexual Activity  . Alcohol use: No  . Drug use: No  . Sexual activity: Yes  Lifestyle  . Physical activity:    Days per week: Not on file    Minutes per session: Not on file  . Stress: Not on file  Relationships  . Social connections:    Talks on phone: Not on file    Gets together: Not on file    Attends religious service: Not on file    Active member of club or organization: Not on file    Attends meetings of clubs or organizations: Not on file    Relationship status: Not on file  . Intimate partner violence:    Fear of current or ex partner: Not on file    Emotionally abused: Not on file    Physically abused: Not on file    Forced sexual activity: Not on file  Other Topics Concern  . Not on file  Social History Narrative   Keeps sister who is mentally retarded.     Allergies  Allergen Reactions  . Cipro Iv [Ciprofloxacin]   . Hctz [Hydrochlorothiazide] Photosensitivity    Family History  Problem Relation Age of Onset  . Heart disease Mother        with pacemaker  . Diabetes Mother   . Asthma Mother   . Heart failure Mother   . Hypertension Father   . Cancer Father        pancreatic cancer  . Diabetes Brother   . Hypertension Sister   . Diabetes Sister        Prior to Admission medications   Medication Sig Start Date End Date Taking? Authorizing Provider  albuterol (PROAIR HFA) 108 (90 Base) MCG/ACT inhaler Inhale 1-2 puffs into the lungs every 6 (six) hours as needed for wheezing or shortness of breath. 08/23/17   Nche, Charlene Brooke, NP  benazepril (LOTENSIN) 20 MG tablet Take 1 tablet (20 mg total) by mouth 2 (two) times daily. 01/15/18   Martinique, Peter M, MD  dabigatran (PRADAXA) 150 MG CAPS capsule Take 1 capsule (150 mg total) by mouth every 12 (twelve) hours. 01/15/18   Martinique, Peter M, MD  diltiazem (DILT-XR) 240 MG 24 hr capsule Take 1 capsule (240 mg total) by mouth daily. 01/15/18   Martinique, Peter M, MD    furosemide (LASIX) 20 MG tablet Take 20 mg daily if needed for swelling 01/15/18   Martinique, Peter M, MD  Garlic 3299 MG CAPS Take 1 capsule by mouth daily.    [provider]  hydrALAZINE (APRESOLINE) 25 MG tablet Take 1 tablet (25 mg total) by mouth 3 (three) times daily. 01/15/18   Martinique, Peter M, MD  metoprolol tartrate (LOPRESSOR) 50 MG tablet Take  1 tablet (50 mg total) by mouth 2 (two) times daily. 01/15/18   Martinique, Peter M, MD  Multiple Vitamin (MULTIVITAMIN) tablet Take 1 tablet by mouth daily.    [provider]  potassium chloride SA (KLOR-CON M20) 20 MEQ tablet Take 1 tablet (20 mEq total) by mouth daily. 01/15/18   Martinique, Peter M, MD  Propylene Glycol 0.6 % SOLN Apply 2-3 drops to eye daily as needed (for eyes).    [provider]  Saw Palmetto, Serenoa repens, (SAW PALMETTO BERRIES) 540 MG CAPS Take 1,080 mg by mouth daily.      [provider]  sildenafil (VIAGRA) 100 MG tablet Take 100 mg by mouth daily as needed for erectile dysfunction.     [provider]  tamsulosin (FLOMAX) 0.4 MG CAPS capsule  07/03/17   [provider]     Physical Exam:  Vitals:   02/28/18 1002 02/28/18 1145 02/28/18 1200 02/28/18 1232  BP: 119/79 (!) 131/98 (!) 152/86 (!) 152/86  Pulse: (!) 110 (!) 112 (!) 119 (!) 124  Resp: 18 19 (!) 21   Temp: 98.3 F (36.8 C)     TempSrc: Oral     SpO2: 98% 97% 98%   Weight: 98.9 kg (218 lb)     Height: 6\' 3"  (1.905 m)      Constitutional: NAD, calm, comfortable   Eyes: PERRL, lids and conjunctivae normal ENMT: Mucous membranes are dry, without exudate or lesions  Neck: normal, supple, no masses, no thyromegaly Respiratory: clear to auscultation bilaterally, no wheezing, no crackles. Normal respiratory effort  Cardiovascular: tachy irregularly irregular rate and rhythm, 2/6   Murmur, no rubs or gallops. No extremity edema. 2+ pedal pulses. No carotid bruits.  Abdomen: Soft, non tender, No  hepatosplenomegaly. Bowel sounds positive.  Musculoskeletal: no clubbing / cyanosis. Moves all extremities Skin: no jaundice, No lesions. Dry skin  Neurologic: Sensation intact  Strength equal in all extremities Psychiatric:   Alert and oriented x 3. Normal mood.     Labs on Admission: I have personally reviewed following labs and imaging studies  CBC: Recent Labs  Lab 02/28/18 1016  WBC 10.8*  HGB 12.4*  HCT 38.3*  MCV 96.7  PLT 751    Basic Metabolic Panel: Recent Labs  Lab 02/28/18 1016  NA 143  K 4.6  CL 112*  CO2 24  GLUCOSE 158*  BUN 52*  CREATININE 1.41*  CALCIUM 9.0    GFR: Estimated Creatinine Clearance: 51.6 mL/min (A) (by C-G formula based on SCr of 1.41 mg/dL (H)).  Liver Function Tests: Recent Labs  Lab 02/28/18 1016  AST 29  ALT 13  ALKPHOS 60  BILITOT 1.8*  PROT 6.2*  ALBUMIN 3.6   No results for input(s): LIPASE, AMYLASE in the last 168 hours. No results for input(s): AMMONIA in the last 168 hours.  Coagulation Profile: Recent Labs  Lab 02/28/18 1139  INR 1.82    Cardiac Enzymes: No results for input(s): CKTOTAL, CKMB, CKMBINDEX, TROPONINI in the last 168 hours.  BNP (last 3 results) No results for input(s): PROBNP in the last 8760 hours.  HbA1C: No results for input(s): HGBA1C in the last 72 hours.  CBG: No results for input(s): GLUCAP in the last 168 hours.  Lipid Profile: No results for input(s): CHOL, HDL, LDLCALC, TRIG, CHOLHDL, LDLDIRECT in the last 72 hours.  Thyroid Function Tests: No results for input(s): TSH, T4TOTAL, FREET4, T3FREE, THYROIDAB in the last 72 hours.  Anemia Panel: No results for  input(s): VITAMINB12, FOLATE, FERRITIN, TIBC, IRON, RETICCTPCT in the last 72 hours.  Urine analysis:    Component Value Date/Time   COLORURINE AMBER (A) 08/27/2014 1345   APPEARANCEUR CLOUDY (A) 08/27/2014 1345   LABSPEC 1.017 08/27/2014 1345   PHURINE 5.5 08/27/2014 1345   GLUCOSEU NEGATIVE 08/27/2014 1345    HGBUR NEGATIVE 08/27/2014 1345   BILIRUBINUR neg 10/23/2017 0955   KETONESUR NEGATIVE 08/27/2014 1345   PROTEINUR 2+     100mg  10/23/2017 0955   PROTEINUR NEGATIVE 08/27/2014 1345   UROBILINOGEN 1.0 10/23/2017 0955   UROBILINOGEN 0.2 08/27/2014 1345   NITRITE neg 10/23/2017 0955   NITRITE NEGATIVE 08/27/2014 1345   LEUKOCYTESUR Moderate (2+) (A) 10/23/2017 0955    Sepsis Labs: @LABRCNTIP (procalcitonin:4,lacticidven:4) )No results found for this or any previous visit (from the past 240 hour(s)).   Radiological Exams on Admission: No results found.  EKG: Independently reviewed.  Assessment/Plan Principal Problem:   GIB (gastrointestinal bleeding) Active Problems:   Squamous cell carcinoma   Essential hypertension   Chronic atrial fibrillation (HCC)   Prostate cancer screening   Chronic anticoagulation   Symptomatic anemia   Elevated PSA   Thrombocytopenia (HCC)    GI bleed in the form of hematemesis and hematochezia, with several episodes of melena, in the setting of chronic anticoagulation with Pradaxa for atrial fibrillation Per EDP on digital rectal exam stool is positive shows BUN is elevated at 52, suggestive of upper GI.  Hemoglobin is 12.8, with normal MCV indicating acute blood loss.  admit to telemetry observation Cycle CBC every 6 hours  Type and screen complete Clear liquids only, NPO after midnight Normal saline IV fluid  Protonix 40mg  IV BID pending GI eval Hold all NSAIDs including preadmission  GI consult pending, appreciate involvement   Anemia of chronic disease and GIB  Hemoglobin on admission 12.4  Hcult pos Hcult   Repeat CBC q 6 h No transfusion is indicated at this time    Atrial Fibrillation   on anticoagulation with Pradaxa Hold Praadaxa due to GIB and for endoscopy in am  Continue Metoprolol if BP allows   Hypertension BP   152/86   Pulse   124 due to  tachycardia   he received 1 dose of metoprolol IV, dropping his rate from  124-110 Continue home anti-hypertensive medications later today.   May have another dose of metoprolol if BP tolerates, for better rate control    Hyperlipidemia Continue home statins  Thrombocytopenia Likely due to  Dilution, acute blood loss  No transfusion is indicated at this time Monitor counts closely Transfuse 1 unit of platelets if count is less or equal than 10,000 or 20,000 if the patient is acutely bleeding   Chronic kidney disease stage stage 3  Current Cr is 1.14 Lab Results  Component Value Date   CREATININE 1.41 (H) 02/28/2018   CREATININE 1.29 12/23/2017   CREATININE 1.24 06/21/2017   IVF Hold ACEI Repeat BMET in am  Hold NSAIDS    Benign prostatic hypertrophy, no acute issues Continue Flomax   Elevated bilirubin, in the setting of  dehydration,  T bil 1.8 LFT normal  No jaundice noted.  Hold tylenol  IVF  WIll continue to follow   DVT prophylaxis:  scd Code Status:    Full code  Family Communication:  Discussed with patient and wife  Disposition Plan: Expect patient to be discharged to home after condition improves Consults called:    GI, Dr. Oletta Lamas, per EDP Admission status:  Tele  Obs    Sharene Butters, PA-C [facility]   Amion text  [phone]   02/28/2018, 1:06 PM

## 2018-02-28 NOTE — ED Triage Notes (Addendum)
Patient complains of bright red blood when wiping last night after bowel movement, then this morning states when he wiped his stool was black. Patient alert, oriented, and in no apparent distress at this time. Patient anticoagulated with pradaxa.

## 2018-02-28 NOTE — ED Provider Notes (Signed)
[facility] EMERGENCY DEPARTMENT Provider Note   CSN: [id number] Arrival date & time: 02/28/18  0086     History   Chief Complaint Chief Complaint  Patient presents with  . Rectal Bleeding    HPI [name] is a 77 y.o. male with a history of chronic atrial fibrillation on Pradaxa, CKD stage III, HTN, and meningioma who presents to the emergency department with a chief complaint of rectal bleeding.  The patient endorses crampy abdominal pain that was severe yesterday and has improved today with one episode of bright red blood per rectum yesterday and generalized weakness.  He reports after the initial episode of hematochezia that he had several episodes of dark black stool that has continued this morning with one episode of hematemesis with bright red blood this morning.  His wife states that he had an episode of bright red emesis yesterday, but it was after the patient had just eaten ketchup so she didn't think anything of it.   He denies fever, chills, nausea, hematuria, chest pain, dyspnea, rash, or headache.  Last colonoscopy and endoscopy in 2010.  The patient does not follow with GI; last colonoscopy and endoscopy was in 2010 with Dr. Deatra Ina.  No history of GI bleed.  The patient states that he did previously have an episode of epistaxis that required him to come to the emergency department, but no other history of uncontrollable bleeding.  Last dose of Pradaxa was last night at 9:30 PM.  He has taken his morning dose of diltiazem, benazepril, and hydralazine, but has not taken his morning metoprolol.  The history is provided by the patient. No language interpreter was used.    Past Medical History:  Diagnosis Date  . Chronic atrial fibrillation ([place])   . Chronic dermatitis   . CKD (chronic kidney disease), stage III ([place])    a. Probable based on historical Cr. H/o ARF. Previously saw Dr. Florene Glen.  . Erectile dysfunction   . Hypertension   .  Hypokalemia   . Meningioma ([facility])    Probable 13 x 12 mm meningioma in right frontal region  . Thrombocytopenia ([place])    improved    Patient Active Problem List   Diagnosis Date Noted  . Elevated PSA 06/21/2017  . Lower extremity edema 09/24/2014  . Hypokalemia   . Hypomagnesemia   . Near syncope 08/27/2014  . CKD (chronic kidney disease), stage III ([place]) 08/27/2014  . Symptomatic anemia 08/27/2014  . Chronic anticoagulation 01/18/2014  . Preventative health care 11/08/2011  . Prostate cancer screening 11/06/2010  . Squamous cell carcinoma 08/09/2008  . ERECTILE DYSFUNCTION 06/25/2008  . Essential hypertension 06/25/2008  . Chronic atrial fibrillation ([place]) 06/25/2008    Past Surgical History:  Procedure Laterality Date  . EYE SURGERY     spot removed from cornea  . TEE WITH CARDIOVERSION  12/14/2004   Successful TEE guided electrical cardioversion. -- showed mild mitral insufficiency and trace pulmonic insufficiency   . TOENAIL EXCISION     ingrown toenail removal  . TONSILLECTOMY          Home Medications    Prior to Admission medications   Medication Sig Start Date End Date Taking? Authorizing Provider  albuterol (PROAIR HFA) 108 (90 Base) MCG/ACT inhaler Inhale 1-2 puffs into the lungs every 6 (six) hours as needed for wheezing or shortness of breath. 08/23/17   Nche, Charlene Brooke, NP  benazepril (LOTENSIN) 20 MG tablet Take 1 tablet (20 mg total) by mouth  2 (two) times daily. 01/15/18   Martinique, Peter M, MD  dabigatran (PRADAXA) 150 MG CAPS capsule Take 1 capsule (150 mg total) by mouth every 12 (twelve) hours. 01/15/18   [place], Peter M, MD  diltiazem (DILT-XR) 240 MG 24 hr capsule Take 1 capsule (240 mg total) by mouth daily. 01/15/18   [place], Peter M, MD  furosemide (LASIX) 20 MG tablet Take 20 mg daily if needed for swelling 01/15/18   [place], Peter M, MD  Garlic 4401 MG CAPS Take 1 capsule by mouth daily.    [provider]  hydrALAZINE (APRESOLINE) 25  MG tablet Take 1 tablet (25 mg total) by mouth 3 (three) times daily. 01/15/18   [place], Peter M, MD  metoprolol tartrate (LOPRESSOR) 50 MG tablet Take 1 tablet (50 mg total) by mouth 2 (two) times daily. 01/15/18   [place], Peter M, MD  Multiple Vitamin (MULTIVITAMIN) tablet Take 1 tablet by mouth daily.    [provider]  potassium chloride SA (KLOR-CON M20) 20 MEQ tablet Take 1 tablet (20 mEq total) by mouth daily. 01/15/18   [place], Peter M, MD  Propylene Glycol 0.6 % SOLN Apply 2-3 drops to eye daily as needed (for eyes).    [provider]  Saw Palmetto, Serenoa repens, (SAW PALMETTO BERRIES) 540 MG CAPS Take 1,080 mg by mouth daily.      [provider]  sildenafil (VIAGRA) 100 MG tablet Take 100 mg by mouth daily as needed for erectile dysfunction.     [provider]  tamsulosin (FLOMAX) 0.4 MG CAPS capsule  07/03/17   [provider]    Family History Family History  Problem Relation Age of Onset  . Heart disease Mother        with pacemaker  . Diabetes Mother   . Asthma Mother   . Heart failure Mother   . Hypertension Father   . Cancer Father        pancreatic cancer  . Diabetes Brother   . Hypertension Sister   . Diabetes Sister     Social History Social History   Tobacco Use  . Smoking status: Former Smoker    Packs/day: 1.00    Years: 10.00    Pack years: 10.00    Types: Cigarettes    Last attempt to quit: 04/25/1970    Years since quitting: 47.8  . Smokeless tobacco: Never Used  Substance Use Topics  . Alcohol use: No  . Drug use: No    Allergies   Cipro iv [ciprofloxacin] and Hctz [hydrochlorothiazide]   Review of Systems Review of Systems  Constitutional: Negative for appetite change, chills and fever.  Eyes: Negative for visual disturbance.  Respiratory: Negative for shortness of breath.   Cardiovascular: Negative for chest pain.  Gastrointestinal: Positive for abdominal pain, anal bleeding, blood in  stool, nausea (resolved) and vomiting (hematemesis).  Genitourinary: Negative for dysuria, hematuria and urgency.  Musculoskeletal: Negative for back pain.  Skin: Negative for rash.  Allergic/Immunologic: Negative for immunocompromised state.  Neurological: Positive for weakness (generalized). Negative for numbness and headaches.  Psychiatric/Behavioral: Negative for confusion.     Physical Exam Updated Vital Signs BP (!) 152/86   Pulse (!) 124   Temp 98.3 F (36.8 C) (Oral)   Resp (!) 21   Ht 6\' 3"  (1.905 m)   Wt 98.9 kg (218 lb)   SpO2 98%   BMI 27.25 kg/m   Physical Exam  Constitutional: He appears well-developed. No distress.  Well  appearing. NAD.   HENT:  Head: Normocephalic.  Eyes: Conjunctivae are normal.  Neck: Neck supple.  Cardiovascular: Normal rate. An irregularly irregular rhythm present.  No murmur heard. Pulses:      Radial pulses are 2+ on the right side, and 2+ on the left side.       Dorsalis pedis pulses are 2+ on the right side, and 2+ on the left side.  Pulmonary/Chest: Effort normal. No stridor. No respiratory distress. He has no wheezes. He has no rales. He exhibits no tenderness.  Abdominal: Soft. He exhibits no distension and no mass. There is tenderness. There is no rebound and no guarding.  Mild tenderness to palpation throughout the abdomen.  No rebound or guarding.  Genitourinary:  Genitourinary Comments: Chaperoned exam.  Dark black stool present on rectal exam.  No external hemorrhoids.  No bright red blood per rectum.  Prostate is nontender.  Neurological: He is alert.  Skin: Skin is warm and dry. He is not diaphoretic.  Psychiatric: His behavior is normal.  Nursing note and vitals reviewed.  ED Treatments / Results  Labs (all labs ordered are listed, but only abnormal results are displayed) Labs Reviewed  COMPREHENSIVE METABOLIC PANEL - Abnormal; Notable for the following components:      Result Value   Chloride 112 (*)    Glucose,  Bld 158 (*)    BUN 52 (*)    Creatinine, Ser 1.41 (*)    Total Protein 6.2 (*)    Total Bilirubin 1.8 (*)    GFR calc non Af Amer 46 (*)    GFR calc Af Amer 54 (*)    All other components within normal limits  CBC - Abnormal; Notable for the following components:   WBC 10.8 (*)    RBC 3.96 (*)    Hemoglobin 12.4 (*)    HCT 38.3 (*)    All other components within normal limits  PROTIME-INR - Abnormal; Notable for the following components:   Prothrombin Time 20.9 (*)    All other components within normal limits  POC OCCULT BLOOD, ED - Abnormal; Notable for the following components:   Fecal Occult Bld POSITIVE (*)    All other components within normal limits  TYPE AND SCREEN  ABO/RH    EKG None  Radiology No results found.  Procedures .Critical Care Performed by: Joanne Gavel, PA-C Authorized by: Joanne Gavel, PA-C   Critical care provider statement:    Critical care time (minutes):  35   Critical care was necessary to treat or prevent imminent or life-threatening deterioration of the following conditions:  Circulatory failure   Critical care was time spent personally by me on the following activities:  Discussions with consultants, examination of patient, obtaining history from patient or surrogate, ordering and review of laboratory studies, ordering and performing treatments and interventions, re-evaluation of patient's condition, review of old charts, pulse oximetry, evaluation of patient's response to treatment and development of treatment plan with patient or surrogate   (including critical care time)  Medications Ordered in ED Medications  sodium chloride 0.9 % bolus 1,000 mL (1,000 mLs Intravenous New Bag/Given 02/28/18 1215)    And  0.9 %  sodium chloride infusion (has no administration in time range)  metoprolol tartrate (LOPRESSOR) tablet 50 mg (50 mg Oral Given 02/28/18 1232)  pantoprazole (PROTONIX) injection 40 mg (40 mg Intravenous Given 02/28/18 1215)      Initial Impression / Assessment and Plan / ED Course  I have  reviewed the triage vital signs and the nursing notes.  Pertinent labs & imaging results that were available during my care of the patient were reviewed by me and considered in my medical decision making (see chart for details).  Clinical Course as of Mar 01 1247  Fri Feb 28, 7125  5031 77 year old male on anticoagulation here with possible GI bleed.  He states he has been constipated all day yesterday and then finally had a bowel movement and noticed red when he wiped.  He continued to have that throughout the night and today he states he passed black diarrhea.  He was nauseous later and vomited and when he vomited there was a little bit of blood in the vomitus.  He is got a little bit of abdominal tenderness.  Here he is tachycardic but otherwise appears well.  He is getting some screening labs rectal exam and likely will need to come in for early serial credits and potentially endoscopy.   [MB]    Clinical Course User Index [MB] Hayden Rasmussen, MD    77 year old male with a history of chronic atrial fibrillation on Pradaxa, CKD stage III, HTN, meningioma who presents to the emergency department with multiple episodes of melena, one episode of hematochezia, and hematemesis that began yesterday.  Last dose of Pradaxa was last night.  The patient was seen and evaluated along with Dr. Melina Copa, attending physician.  Tachycardic in the 110s on arrival in the ED.  He is otherwise hemodynamically stable.  Heart rate increases into the 140s with orthostatic vitals.  Patient originally states that he took all of his morning medications, but did not take his morning dose of metoprolol.  He has no history of underlying heart failure and last EF on echo was normal.  Will also give IV fluid bolus since he has positive orthostatics.   Spoke with Dr. Oletta Lamas, gastroenterology, who will follow the patient and plan for colonoscopy and  endoscopy tomorrow.  Spoke with PA [facility], hospitalist, who will accept the patient for admission. The patient appears reasonably stabilized for admission considering the current resources, flow, and capabilities available in the ED at this time, and I doubt any other [facility] requiring further screening and/or treatment in the ED prior to admission.  Final Clinical Impressions(s) / ED Diagnoses   Final diagnoses:  Acute GI bleeding    ED Discharge Orders    None       Joanne Gavel, PA-C 02/28/18 1248    Hayden Rasmussen, MD 03/01/18 1051

## 2018-03-01 ENCOUNTER — Encounter (HOSPITAL_COMMUNITY)
Admission: EM | Disposition: A | Discharge status: Home / Self Care | Payer: Self-pay | Source: Home / Self Care | Attending: Family Medicine

## 2018-03-01 ENCOUNTER — Encounter (HOSPITAL_COMMUNITY): Payer: Self-pay

## 2018-03-01 ENCOUNTER — Observation Stay (HOSPITAL_COMMUNITY): Payer: Medicare Other | Admitting: Anesthesiology

## 2018-03-01 DIAGNOSIS — K920 Hematemesis: Secondary | ICD-10-CM | POA: Diagnosis not present

## 2018-03-01 DIAGNOSIS — K922 Gastrointestinal hemorrhage, unspecified: Secondary | ICD-10-CM | POA: Diagnosis not present

## 2018-03-01 DIAGNOSIS — K3182 Dieulafoy lesion (hemorrhagic) of stomach and duodenum: Secondary | ICD-10-CM | POA: Diagnosis present

## 2018-03-01 DIAGNOSIS — Z7901 Long term (current) use of anticoagulants: Secondary | ICD-10-CM | POA: Diagnosis not present

## 2018-03-01 DIAGNOSIS — Z888 Allergy status to other drugs, medicaments and biological substances status: Secondary | ICD-10-CM | POA: Diagnosis not present

## 2018-03-01 DIAGNOSIS — D62 Acute posthemorrhagic anemia: Secondary | ICD-10-CM

## 2018-03-01 DIAGNOSIS — Z87891 Personal history of nicotine dependence: Secondary | ICD-10-CM | POA: Diagnosis not present

## 2018-03-01 DIAGNOSIS — D696 Thrombocytopenia, unspecified: Secondary | ICD-10-CM | POA: Diagnosis not present

## 2018-03-01 DIAGNOSIS — I482 Chronic atrial fibrillation: Secondary | ICD-10-CM

## 2018-03-01 DIAGNOSIS — Z86011 Personal history of benign neoplasm of the brain: Secondary | ICD-10-CM | POA: Diagnosis not present

## 2018-03-01 DIAGNOSIS — N183 Chronic kidney disease, stage 3 (moderate): Secondary | ICD-10-CM | POA: Diagnosis present

## 2018-03-01 DIAGNOSIS — D6959 Other secondary thrombocytopenia: Secondary | ICD-10-CM | POA: Diagnosis present

## 2018-03-01 DIAGNOSIS — I129 Hypertensive chronic kidney disease with stage 1 through stage 4 chronic kidney disease, or unspecified chronic kidney disease: Secondary | ICD-10-CM | POA: Diagnosis present

## 2018-03-01 DIAGNOSIS — D649 Anemia, unspecified: Secondary | ICD-10-CM

## 2018-03-01 DIAGNOSIS — L309 Dermatitis, unspecified: Secondary | ICD-10-CM | POA: Diagnosis present

## 2018-03-01 DIAGNOSIS — D631 Anemia in chronic kidney disease: Secondary | ICD-10-CM | POA: Diagnosis present

## 2018-03-01 DIAGNOSIS — Z859 Personal history of malignant neoplasm, unspecified: Secondary | ICD-10-CM | POA: Diagnosis not present

## 2018-03-01 DIAGNOSIS — K921 Melena: Secondary | ICD-10-CM | POA: Diagnosis present

## 2018-03-01 DIAGNOSIS — E785 Hyperlipidemia, unspecified: Secondary | ICD-10-CM | POA: Diagnosis present

## 2018-03-01 DIAGNOSIS — I10 Essential (primary) hypertension: Secondary | ICD-10-CM

## 2018-03-01 DIAGNOSIS — Z825 Family history of asthma and other chronic lower respiratory diseases: Secondary | ICD-10-CM | POA: Diagnosis not present

## 2018-03-01 DIAGNOSIS — Z833 Family history of diabetes mellitus: Secondary | ICD-10-CM | POA: Diagnosis not present

## 2018-03-01 DIAGNOSIS — Z8249 Family history of ischemic heart disease and other diseases of the circulatory system: Secondary | ICD-10-CM | POA: Diagnosis not present

## 2018-03-01 DIAGNOSIS — E876 Hypokalemia: Secondary | ICD-10-CM | POA: Diagnosis not present

## 2018-03-01 DIAGNOSIS — N400 Enlarged prostate without lower urinary tract symptoms: Secondary | ICD-10-CM | POA: Diagnosis present

## 2018-03-01 DIAGNOSIS — Z881 Allergy status to other antibiotic agents status: Secondary | ICD-10-CM | POA: Diagnosis not present

## 2018-03-01 HISTORY — PX: ESOPHAGOGASTRODUODENOSCOPY (EGD) WITH PROPOFOL: SHX5813

## 2018-03-01 HISTORY — DX: Acute posthemorrhagic anemia: D62

## 2018-03-01 HISTORY — PX: SCHLEROTHERAPY: SHX5440

## 2018-03-01 LAB — CBC
HCT: 28.3 % — ABNORMAL LOW (ref 39.0–52.0)
HCT: 23.8 % — ABNORMAL LOW (ref 39.0–52.0)
HCT: 24.2 % — ABNORMAL LOW (ref 39.0–52.0)
HEMATOCRIT: 25 % — AB (ref 39.0–52.0)
HEMOGLOBIN: 8 g/dL — AB (ref 13.0–17.0)
Hemoglobin: 9.2 g/dL — ABNORMAL LOW (ref 13.0–17.0)
Hemoglobin: 7.8 g/dL — ABNORMAL LOW (ref 13.0–17.0)
Hemoglobin: 7.7 g/dL — ABNORMAL LOW (ref 13.0–17.0)
MCH: 31.3 pg (ref 26.0–34.0)
MCH: 31.5 pg (ref 26.0–34.0)
MCH: 30.8 pg (ref 26.0–34.0)
MCH: 30.5 pg (ref 26.0–34.0)
MCHC: 32.5 g/dL (ref 30.0–36.0)
MCHC: 32.8 g/dL (ref 30.0–36.0)
MCHC: 31.8 g/dL (ref 30.0–36.0)
MCHC: 32 g/dL (ref 30.0–36.0)
MCV: 96.3 fL (ref 78.0–100.0)
MCV: 96 fL (ref 78.0–100.0)
MCV: 96.8 fL (ref 78.0–100.0)
MCV: 95.4 fL (ref 78.0–100.0)
Platelets: 133 10*3/uL — ABNORMAL LOW (ref 150–400)
Platelets: 123 10*3/uL — ABNORMAL LOW (ref 150–400)
Platelets: 121 10*3/uL — ABNORMAL LOW (ref 150–400)
Platelets: 111 10*3/uL — ABNORMAL LOW (ref 150–400)
RBC: 2.94 MIL/uL — ABNORMAL LOW (ref 4.22–5.81)
RBC: 2.48 MIL/uL — ABNORMAL LOW (ref 4.22–5.81)
RBC: 2.5 MIL/uL — ABNORMAL LOW (ref 4.22–5.81)
RBC: 2.62 MIL/uL — AB (ref 4.22–5.81)
RDW: 14.6 % (ref 11.5–15.5)
RDW: 14.6 % (ref 11.5–15.5)
RDW: 14.6 % (ref 11.5–15.5)
RDW: 14.9 % (ref 11.5–15.5)
WBC: 13.7 10*3/uL — ABNORMAL HIGH (ref 4.0–10.5)
WBC: 9.6 10*3/uL (ref 4.0–10.5)
WBC: 8.9 10*3/uL (ref 4.0–10.5)
WBC: 7.5 10*3/uL (ref 4.0–10.5)

## 2018-03-01 LAB — COMPREHENSIVE METABOLIC PANEL
ALT: 11 U/L (ref 0–44)
AST: 15 U/L (ref 15–41)
Albumin: 2.7 g/dL — ABNORMAL LOW (ref 3.5–5.0)
Alkaline Phosphatase: 38 U/L (ref 38–126)
Anion gap: 4 — ABNORMAL LOW (ref 5–15)
BUN: 43 mg/dL — ABNORMAL HIGH (ref 8–23)
CO2: 24 mmol/L (ref 22–32)
CREATININE: 1.22 mg/dL (ref 0.61–1.24)
Calcium: 8.1 mg/dL — ABNORMAL LOW (ref 8.9–10.3)
Chloride: 117 mmol/L — ABNORMAL HIGH (ref 98–111)
GFR calc Af Amer: >60 mL/min (ref >60)
GFR, EST NON AFRICAN AMERICAN: 55 mL/min — AB (ref >60)
Glucose, Bld: 117 mg/dL — ABNORMAL HIGH (ref 70–99)
Potassium: 3.8 mmol/L (ref 3.5–5.1)
Sodium: 145 mmol/L (ref 135–145)
Total Bilirubin: 1 mg/dL (ref 0.3–1.2)
Total Protein: 4.6 g/dL — ABNORMAL LOW (ref 6.5–8.1)

## 2018-03-01 LAB — PREPARE RBC (CROSSMATCH)

## 2018-03-01 LAB — PROTIME-INR
INR: 1.55
PROTHROMBIN TIME: 18.5 s — AB (ref 11.4–15.2)

## 2018-03-01 SURGERY — EGD (ESOPHAGOGASTRODUODENOSCOPY)
Anesthesia: Moderate Sedation

## 2018-03-01 SURGERY — ESOPHAGOGASTRODUODENOSCOPY (EGD) WITH PROPOFOL
Anesthesia: Monitor Anesthesia Care

## 2018-03-01 MED ORDER — PROPOFOL 500 MG/50ML IV EMUL
INTRAVENOUS | Status: DC | PRN
  Administered 2018-03-01: 100 ug/kg/min via INTRAVENOUS

## 2018-03-01 MED ORDER — SODIUM CHLORIDE 0.9 % IV SOLN
8.0000 mg/h | INTRAVENOUS | Status: DC
  Administered 2018-03-01 – 2018-03-02 (×4): 8 mg/h via INTRAVENOUS
  Filled 2018-03-01 (×8): qty 80

## 2018-03-01 MED ORDER — EPINEPHRINE PF 1 MG/10ML IJ SOSY
PREFILLED_SYRINGE | INTRAMUSCULAR | Status: DC | PRN
  Administered 2018-03-01: 6 mL

## 2018-03-01 MED ORDER — PROPOFOL 10 MG/ML IV BOLUS
INTRAVENOUS | Status: DC | PRN
  Administered 2018-03-01 (×4): 20 mg via INTRAVENOUS

## 2018-03-01 MED ORDER — EPINEPHRINE PF 1 MG/10ML IJ SOSY
PREFILLED_SYRINGE | INTRAMUSCULAR | Status: AC
  Filled 2018-03-01: qty 10

## 2018-03-01 MED ORDER — SODIUM CHLORIDE 0.9% IV SOLUTION
Freq: Once | INTRAVENOUS | Status: AC
  Administered 2018-03-01: 15:00:00 via INTRAVENOUS

## 2018-03-01 MED ORDER — SODIUM CHLORIDE 0.9 % IV SOLN
80.0000 mg | Freq: Once | INTRAVENOUS | Status: AC
  Administered 2018-03-01: 80 mg via INTRAVENOUS
  Filled 2018-03-01: qty 80

## 2018-03-01 SURGICAL SUPPLY — 15 items

## 2018-03-01 NOTE — Anesthesia Postprocedure Evaluation (Signed)
Anesthesia Post Note  Patient: [name]  Procedure(s) Performed: ESOPHAGOGASTRODUODENOSCOPY (EGD) WITH PROPOFOL (N/A ) SCHLEROTHERAPY OF VARICES     Patient location during evaluation: PACU Anesthesia Type: MAC Level of consciousness: awake and alert Pain management: pain level controlled Vital Signs Assessment: post-procedure vital signs reviewed and stable Respiratory status: spontaneous breathing, nonlabored ventilation, respiratory function stable and patient connected to nasal cannula oxygen Cardiovascular status: stable and blood pressure returned to baseline Postop Assessment: no apparent nausea or vomiting Anesthetic complications: no    Last Vitals:  Vitals:   03/01/18 1249 03/01/18 1419  BP: (!) 163/84 (!) 141/86  Pulse: 100 (!) 101  Resp: (!) 22 18  Temp:  37 C  SpO2: 100% 100%    Last Pain:  Vitals:   03/01/18 1419  TempSrc: Oral  PainSc:                  Effie Berkshire

## 2018-03-01 NOTE — Progress Notes (Addendum)
Patient ambulated to the bathroom without complication.  Patient had a large, black and tarry bowel movement.  While this nurse and the nurse tech was assisting the patient with being cleaned, the patient was standing and holding the railing on the walls of the bathroom.  This nurse noticed the patient starting to sway back and forth as if he was becoming lightheaded.  This nurse quickly got behind the patient and secured the patient under his arms.  Shortly after this, the patient lost his footing and began to slide to the ground.  This nurse was able to gently guide the patient to a sitting position on the floor.  The patient did not hit the wall or any other objects on his way to the ground.  Once the patient was brought to a sitting position on the ground, he began to regain consciousness.  The patient sat on the floor with this nurse and the nurse tech at his side for approximately 15 minutes.  With the assistance of additional staff and a gait belt, the patient was then assisted from the floor to a wheelchair without complication.  Patient was then transferred from the wheelchair back to his bed without complication.    The patient denied any pain associated with the fall.  Post-fall assessment revealed no pertinent findings.  Paged [facility] provider C. Bodenheimer, who ordered a CBC to check the patient's hemoglobin levels, given multiple black and tarry bowel movements and the syncopal episode.  The patient's spouse was called and informed of the event.  Both this nurse and the patient spoke with the spouse and explained what happened.  This nurse also informed the patient's spouse of the additional steps that would be taken to assess the patient and to prevent a similar event from happening again.  Patient's spouse was appreciative of the call and denied any further questions.    Patient is now resting comfortably in bed with bed alarm activated and floor mats in place.  Patient denies any needs at this  time.  Will continue to monitor patient closely.      03/01/18 0045  What Happened  Was fall witnessed? Yes  Who witnessed fall? Kathleene Hazel RN, Elpidio Galea NT  Patients activity before fall other (comment) (standing in bathroom to be cleaned, holding rails)  Point of contact buttocks  Was patient injured? No (assisted fall, pt was slowly lowered to the ground)  Follow Up  MD notified C. Bodenheimer, NP  Time MD notified Yates notified Yes-comment (pt's spouse was called, pt and nurse spoke with spouse)  Time family notified 0100  Additional tests Yes-comment  Progress note created (see row info) Yes  Adult Fall Risk Assessment  Risk Factor Category (scoring not indicated) Fall has occurred during this admission (document High fall risk)  Patient's Fall Risk High Fall Risk (>13 points)  Adult Fall Risk Interventions  Required Bundle Interventions *See Row Information* High fall risk - low, moderate, and high requirements implemented  Additional Interventions Use of appropriate toileting equipment (bedpan, BSC, etc.)  Screening for Fall Injury Risk (To be completed on HIGH fall risk patients) - Assessing Need for Low Bed  Risk For Fall Injury- Low Bed Criteria None identified - Continue screening  Screening for Fall Injury Risk (To be completed on HIGH fall risk patients who do not meet crieteria for Low Bed) - Assessing Need for Floor Mats Only  Risk For Fall Injury- Criteria for Floor Mats Bleeding risk-anticoagulation (not prophylaxis)  Will Implement Floor Mats Yes  Pain Assessment  Pain Scale 0-10  Pain Score 0  PCA/Epidural/Spinal Assessment  Respiratory Pattern Regular;Unlabored  Neurological  Neuro (WDL) WDL  Level of Consciousness Alert  Orientation Level Oriented X4  Cognition Appropriate at baseline;Appropriate attention/concentration;Appropriate judgement;Appropriate safety awareness;Appropriate for developmental age;Follows commands;No memory  impairment  Speech Clear  Pupil Assessment  Yes  R Pupil Size (mm) 3  R Pupil Shape Round  R Pupil Reaction Brisk  L Pupil Size (mm) 3  L Pupil Shape Round  L Pupil Reaction Brisk  Additional Pupil Assessments No  Motor Function/Sensation Assessment Grip;Dorsiflexion;Plantar flexion;Sensation  R Hand Grip Present;Strong  L Hand Grip Present;Strong  R Foot Dorsiflexion Present;Strong  L Foot Dorsiflexion Present;Strong  R Foot Plantar Flexion Present;Strong  L Foot Plantar Flexion Present;Strong  RUE Sensation Full sensation;No numbness;No tingling  LUE Sensation Full sensation;No numbness;No tingling  RLE Sensation Full sensation;No numbness;No tingling  LLE Sensation Full sensation;No numbness;No tingling  Neuro Symptoms None  Neuro Additional Assessments No  Musculoskeletal  Musculoskeletal (WDL) WDL  Integumentary  Integumentary (WDL) X  Skin Color Appropriate for ethnicity  Skin Condition Dry  Skin Integrity Abrasion;MASD  Abrasion Location Foot  Abrasion Location Orientation Anterior;Left  Abrasion Intervention Other (Comment) (assessed)  Moisture Associated Skin Damage Location Scrotum  Moisture Associated Skin Damage Orientation Anterior;Posterior  Moisture Associated Skin Damage Intervention Other (Comment) (assessed)  Skin Turgor Non-tenting  Pain Screening  Clinical Progression Not changed

## 2018-03-01 NOTE — Anesthesia Preprocedure Evaluation (Addendum)
Anesthesia Evaluation  Patient identified by MRN, date of birth, ID band Patient awake    Reviewed: Allergy & Precautions, NPO status , Patient's Chart, lab work & pertinent test results  Airway Mallampati: I  TM Distance: >3 FB Neck ROM: Full    Dental  (+) Missing,    Pulmonary former smoker,    breath sounds clear to auscultation       Cardiovascular hypertension, Pt. on medications and Pt. on home beta blockers + dysrhythmias Atrial Fibrillation  Rhythm:Regular Rate:Normal     Neuro/Psych negative neurological ROS  negative psych ROS   GI/Hepatic negative GI ROS, Neg liver ROS,   Endo/Other  negative endocrine ROS  Renal/GU CRFRenal disease     Musculoskeletal negative musculoskeletal ROS (+)   Abdominal Normal abdominal exam  (+)   Peds  Hematology   Anesthesia Other Findings   Reproductive/Obstetrics                            Anesthesia Physical Anesthesia Plan  ASA: III  Anesthesia Plan: MAC   Post-op Pain Management:    Induction: Intravenous  PONV Risk Score and Plan: 1 and Propofol infusion  Airway Management Planned: Nasal Cannula  Additional Equipment: None  Intra-op Plan:   Post-operative Plan:   Informed Consent: I have reviewed the patients History and Physical, chart, labs and discussed the procedure including the risks, benefits and alternatives for the proposed anesthesia with the patient or authorized representative who has indicated his/her understanding and acceptance.     Plan Discussed with: CRNA  Anesthesia Plan Comments:         Anesthesia Quick Evaluation

## 2018-03-01 NOTE — Op Note (Signed)
[facility] Patient Name: [name] Procedure Date : 03/01/2018 MRN: [id number] Attending MD: Estill Cotta. Loletha Carrow , MD Date of Birth: 11-Dec-1940 CSN: [id number] Age: 77 Admit Type: Inpatient Procedure:                Upper GI endoscopy Indications:              Acute post hemorrhagic anemia, Hematemesis, Melena Providers:                Mallie Mussel L. Loletha Carrow, MD, Cleda Daub, RN, Elspeth Cho Tech., Technician, Clearnce Sorrel, CRNA Referring MD:              Medicines:                Monitored Anesthesia Care Complications:            No immediate complications. Estimated Blood Loss:     Estimated blood loss was minimal. Procedure:                Pre-Anesthesia Assessment:                           - Prior to the procedure, a History and Physical                            was performed, and patient medications and                            allergies were reviewed. The patient's tolerance of                            previous anesthesia was also reviewed. The risks                            and benefits of the procedure and the sedation                            options and risks were discussed with the patient.                            All questions were answered, and informed consent                            was obtained. Prior Anticoagulants: The patient has                            taken Pradaxa (dabigatran), last dose was 2 days                            prior to procedure. ASA Grade Assessment: III - A                            patient with severe systemic disease. After  reviewing the risks and benefits, the patient was                            deemed in satisfactory condition to undergo the                            procedure.                           After obtaining informed consent, the endoscope was                            passed under direct vision. Throughout the   procedure, the patient's blood pressure, pulse, and                            oxygen saturations were monitored continuously. The                            GIF-H190 ([id number]) Olympus adult EGD was introduced                            through the mouth, and advanced to the second part                            of duodenum. The upper GI endoscopy was performed                            with difficulty due to excessive bleeding. The                            patient tolerated the procedure fairly well. Scope In: Scope Out: Findings:      The esophagus was normal.      A Dieulafoy lesion with oozing bleeding and was found in the cardia.       Area was successfully injected with 6 mL of a 1:10,000 solution of       epinephrine for hemostasis. For hemostasis, two hemostatic clips were       successfully placed (MR conditional); one clip failed. There was no       bleeding at the end of the procedure.      Red blood was found in the entire examined stomach.      The exam of the stomach was otherwise normal, given limitations on       visualization from bleeding.      The examined duodenum was normal. Impression:               - Normal esophagus.                           - Dieulafoy lesion of stomach.                           - Red blood in the entire stomach.                           -  Normal examined duodenum.                           - No specimens collected. Recommendation:           - Return patient to hospital ward for ongoing care.                           - Clear liquid diet.                           - Continue present medications.                           - Serial hemoglobin and hematocrit Q 6 hrs and                            transfuse for Hgb < or = 7.5 Procedure Code(s):        --- Professional ---                           [phone], Esophagogastroduodenoscopy, flexible,                            transoral; with control of bleeding, any method Diagnosis Code(s):         --- Professional ---                           K31.82, Dieulafoy lesion (hemorrhagic) of stomach                            and duodenum                           K92.2, Gastrointestinal hemorrhage, unspecified                           D62, Acute posthemorrhagic anemia                           K92.0, Hematemesis                           K92.1, Melena (includes Hematochezia) CPT copyright 2017 [employer]. All rights reserved. The codes documented in this report are preliminary and upon coder review may  be revised to meet current compliance requirements. Henry L. Loletha Carrow, MD 03/01/2018 12:35:49 PM This report has been signed electronically. Number of Addenda: 0

## 2018-03-01 NOTE — Progress Notes (Addendum)
PROGRESS NOTE  [name]  PPJ:[id number] DOB: Dec 18, 1940 DOA: 02/28/2018 PCP: Flossie Buffy, NP   Brief Narrative: [name] is a 77 y.o. male with a history of AFib on pradaxa, stage III CKD, HTN, meningioma who presented to the ED with hematochezia and melena associated with crampy abdominal pain followed by an episode of hematemesis. Hgb found to be 12.4, platelets 151, BUN 52, Cr 1.41, INR 1.82, +FOBT x2. GI was consulted, pradaxa held, and pt admitted. Hemoglobin trended down steadily with ongoing clinical evidence of GI bleeding including a syncopal episode in the bathroom overnight to 7.7 this PM. EGD revealed actively bleeding dieulafoy gastric lesion treated with epinephrine and clipping 7/27. 1u PRBCs ordered and serial CBC to continue.  Assessment & Plan: Principal Problem:   GIB (gastrointestinal bleeding) Active Problems:   Squamous cell carcinoma   Essential hypertension   Chronic atrial fibrillation (HCC)   Prostate cancer screening   Chronic anticoagulation   Symptomatic anemia   Elevated PSA   Thrombocytopenia (HCC)  Acute blood loss anemia: Due to GI bleeding as below.  - Hgb with worrisome steady decline ~5g/dl over 24 hours in the face of ongoing bleeding and syncope.  - Will give 1u PRBCs now and monitor CBC q6h. If continues clinical bleeding, would aim for hgb >8, certainly transfuse per GI if 7.5 or less. - Has dual IVs, will transfer to SDU for close monitoring. - If red bleeding continues this PM (could expect dark blood still in GI tract in absence of ongoing bleeding) or becomes hypotensive, would repeat transfusion empirically, administer praxbind and hold 2 units ahead - PPI gtt - Of course continue to hold anticoagulation  Gastric dieulafoy lesion: s/p epinephrine injection, clipping x2 (1 failed) with achieved hemostasis at time of EGD 7/27.  - Clear liquid diet per GI.  AFib:  - Continue metoprolol, diltiazem - Hold  anticoagulation  HTN: Unsure if arterial HTN should be more aggressively managed in face of dieulafoy lesion, but fortunately not hypotensive. - Continue metoprolol, diltiazem for rate control. Hold parameter for home hydralazine for MAP < 52mmHg.  Stage III CKD: SCr 1.22, CrCl nearly 75ml/min - Avoid hypotension, hold ACE - DC IVF's with hyperchloremia this can actually decrease renal perfusion due to macula densa sensing elevated chloride. - Avoid nephrotoxins, of course including NSAIDs - Monitor metabolic panel daily  Thrombocytopenia: Chronic, revealed with fluid resuscitation.  - Monitor closely.   BPH:  - Continue flomax  Hyperlipidemia:  - Continue statin  TBili elevation: Doubt this is significant finding, resolved. Not consistent with hemolytic process.  DVT prophylaxis: SCDs Code Status: Full Family Communication: Wife at bedside Disposition Plan: SDU transfer on progressive unit. Discussed with RN.  Consultants:   GI  Procedures:   EGD 03/01/2018 by Dr. Fuller Plan: Impression:       - Normal esophagus.                           - Dieulafoy lesion of stomach.                           - Red blood in the entire stomach.                           - Normal examined duodenum.                           -  No specimens collected. Recommendation:- Return patient to hospital ward for ongoing care.                           - Clear liquid diet.                           - Continue present medications.                           - Serial hemoglobin and hematocrit Q 6 hrs and                            transfuse for Hgb < or = 7.5  Antimicrobials:  None   Subjective: Felt dizzy, brief syncopal episode in bathroom, lowered slowly to ground by RN without trauma. Had continues bleeding as of this AM. No chest pain or dyspnea.  Objective: Vitals:   03/01/18 0712 03/01/18 1120 03/01/18 1229 03/01/18 1239  BP: 135/83 136/83 (!) 156/92 (!) 170/67  Pulse: 98 95 96 (!) 107   Resp: 20 11 (!) 22 (!) 21  Temp: 97.9 F (36.6 C) 98.7 F (37.1 C) 98.3 F (36.8 C)   TempSrc: Oral Oral Oral   SpO2: 100% 99% 100% 100%  Weight:  96.7 kg (213 lb 3 oz)    Height:  6\' 3"  (1.905 m)      Intake/Output Summary (Last 24 hours) at 03/01/2018 1245 Last data filed at 03/01/2018 1220 Gross per 24 hour  Intake 3510.8 ml  Output 1476 ml  Net 2034.8 ml   Filed Weights   02/28/18 1601 03/01/18 0319 03/01/18 1120  Weight: 96.2 kg (212 lb 1.3 oz) 96.7 kg (213 lb 3 oz) 96.7 kg (213 lb 3 oz)    Gen: 77 y.o. male in no distress Pulm: Non-labored breathing room air. Clear to auscultation bilaterally.  CV: Irreg, rate in 90's. No murmur, rub, or gallop. No JVD, no pedal edema. GI: Abdomen soft, non-tender, non-distended, with normoactive bowel sounds. No organomegaly or masses felt. Ext: Warm, no deformities Skin: No rashes, lesions or ulcers Neuro: Alert and oriented. No focal neurological deficits. Psych: Judgement and insight appear normal. Mood & affect appropriate.   Data Reviewed: I have personally reviewed following labs and imaging studies  CBC: Recent Labs  Lab 02/28/18 1016 02/28/18 1451 02/28/18 2145 03/01/18 0144 03/01/18 0619  WBC 10.8* 10.1 11.7* 13.7* 9.6  HGB 12.4* 10.5* 9.4* 9.2* 7.8*  HCT 38.3* 32.1* 29.2* 28.3* 23.8*  MCV 96.7 95.8 95.4 96.3 96.0  PLT 151 140* 130* 133* 323*   Basic Metabolic Panel: Recent Labs  Lab 02/28/18 1016 03/01/18 0619  NA 143 145  K 4.6 3.8  CL 112* 117*  CO2 24 24  GLUCOSE 158* 117*  BUN 52* 43*  CREATININE 1.41* 1.22  CALCIUM 9.0 8.1*   GFR: Estimated Creatinine Clearance: 59.6 mL/min (by C-G formula based on SCr of 1.22 mg/dL). Liver Function Tests: Recent Labs  Lab 02/28/18 1016 03/01/18 0619  AST 29 15  ALT 13 11  ALKPHOS 60 38  BILITOT 1.8* 1.0  PROT 6.2* 4.6*  ALBUMIN 3.6 2.7*   Coagulation Profile: Recent Labs  Lab 02/28/18 1139 03/01/18 0711  INR 1.82 1.55   Urine analysis:     Component Value Date/Time   COLORURINE STRAW (A) 02/28/2018 1342   [employer]  02/28/2018 1342   LABSPEC 1.013 02/28/2018 1342   PHURINE 5.0 02/28/2018 1342   GLUCOSEU NEGATIVE 02/28/2018 1342   HGBUR NEGATIVE 02/28/2018 1342   [place] 02/28/2018 1342   BILIRUBINUR neg 10/23/2017 0955   KETONESUR 5 (A) 02/28/2018 1342   PROTEINUR NEGATIVE 02/28/2018 1342   UROBILINOGEN 1.0 10/23/2017 0955   UROBILINOGEN 0.2 08/27/2014 1345   NITRITE NEGATIVE 02/28/2018 1342   LEUKOCYTESUR NEGATIVE 02/28/2018 1342    Time spent: 35 minutes.  Patrecia Pour, MD [facility] [email or web address] Password [facility] 03/01/2018, 12:45 PM

## 2018-03-01 NOTE — Transfer of Care (Signed)
Immediate Anesthesia Transfer of Care Note  Patient: [name]  Procedure(s) Performed: ESOPHAGOGASTRODUODENOSCOPY (EGD) WITH PROPOFOL (N/A ) SCHLEROTHERAPY OF VARICES  Patient Location: Endoscopy Unit  Anesthesia Type:MAC  Level of Consciousness: awake  Airway & Oxygen Therapy: Patient Spontanous Breathing and Patient connected to nasal cannula oxygen  Post-op Assessment: Report given to RN and Post -op Vital signs reviewed and stable  Post vital signs: Reviewed and stable  Last Vitals:  Vitals Value Taken Time  BP    Temp    Pulse    Resp    SpO2      Last Pain:  Vitals:   03/01/18 1120  TempSrc: Oral  PainSc: 0-No pain         Complications: No apparent anesthesia complications

## 2018-03-02 LAB — CBC
HCT: 23.1 % — ABNORMAL LOW (ref 39.0–52.0)
HEMATOCRIT: 22.5 % — AB (ref 39.0–52.0)
HEMATOCRIT: 23.6 % — AB (ref 39.0–52.0)
Hemoglobin: 7.3 g/dL — ABNORMAL LOW (ref 13.0–17.0)
Hemoglobin: 7.8 g/dL — ABNORMAL LOW (ref 13.0–17.0)
Hemoglobin: 7.4 g/dL — ABNORMAL LOW (ref 13.0–17.0)
MCH: 30.9 pg (ref 26.0–34.0)
MCH: 31.3 pg (ref 26.0–34.0)
MCH: 30.7 pg (ref 26.0–34.0)
MCHC: 32.4 g/dL (ref 30.0–36.0)
MCHC: 33.1 g/dL (ref 30.0–36.0)
MCHC: 32 g/dL (ref 30.0–36.0)
MCV: 95.3 fL (ref 78.0–100.0)
MCV: 94.8 fL (ref 78.0–100.0)
MCV: 95.9 fL (ref 78.0–100.0)
Platelets: 114 10*3/uL — ABNORMAL LOW (ref 150–400)
Platelets: 113 10*3/uL — ABNORMAL LOW (ref 150–400)
Platelets: 117 10*3/uL — ABNORMAL LOW (ref 150–400)
RBC: 2.36 MIL/uL — ABNORMAL LOW (ref 4.22–5.81)
RBC: 2.49 MIL/uL — ABNORMAL LOW (ref 4.22–5.81)
RBC: 2.41 MIL/uL — ABNORMAL LOW (ref 4.22–5.81)
RDW: 15 % (ref 11.5–15.5)
RDW: 14.9 % (ref 11.5–15.5)
RDW: 14.7 % (ref 11.5–15.5)
WBC: 8 10*3/uL (ref 4.0–10.5)
WBC: 5.7 10*3/uL (ref 4.0–10.5)
WBC: 5.8 10*3/uL (ref 4.0–10.5)

## 2018-03-02 LAB — BPAM RBC
Blood Product Expiration Date: 201908302359
ISSUE DATE / TIME: 201907271432
Unit Type and Rh: 5100

## 2018-03-02 LAB — TYPE AND SCREEN
ABO/RH(D): O POS
Antibody Screen: NEGATIVE
UNIT DIVISION: 0

## 2018-03-02 MED ORDER — FERUMOXYTOL INJECTION 510 MG/17 ML
510.0000 mg | Freq: Once | INTRAVENOUS | Status: AC
  Administered 2018-03-02: 510 mg via INTRAVENOUS
  Filled 2018-03-02: qty 17

## 2018-03-02 MED ORDER — SODIUM CHLORIDE 0.9 % IV SOLN
INTRAVENOUS | Status: DC
  Administered 2018-03-02: 16:00:00 via INTRAVENOUS

## 2018-03-02 NOTE — Progress Notes (Signed)
[facility] Progress Note  Chief Complaint: Hematemesis  History:  [name] has done well since his upper endoscopy yesterday.  He has had no bowel movements or passage of black or bloody stool hematemesis in the last 24 hours.  He denies abdominal pain, and is hungry.  He has tolerated a good diet so far.  Hemoglobin has fluctuated between seven 7.3 and 8 point over the last 24 hours, he did not receive any blood since his endoscopy yesterday.  ROS: Cardiovascular:  no chest pain Respiratory: no dyspnea  Objective:  Med list reviewed  Vital signs in last 24 hrs: Vitals:   03/02/18 0850 03/02/18 1002  BP: 105/62   Pulse: 97   Resp: 15   Temp:  98.5 F (36.9 C)  SpO2: 99%     Physical Exam   HEENT: sclera anicteric, oral mucosa moist without lesions.  He is generally well-appearing, alert and conversational and in no distress.  Neck: supple, no thyromegaly, JVD or lymphadenopathy  Cardiac: RRR without murmurs, S1S2 heard, no peripheral edema  Pulm: clear to auscultation bilaterally, normal RR and effort noted  Abdomen: soft, no tenderness, with active bowel sounds. No guarding or palpable hepatosplenomegaly Skin; warm and dry, no jaundice or rash. + pale. Recent Labs:  Recent Labs  Lab 03/01/18 2101 03/02/18 0141 03/02/18 0648  WBC 7.5 8.0 5.7  HGB 8.0* 7.3* 7.8*  HCT 25.0* 22.5* 23.6*  PLT 111* 114* 113*   Recent Labs  Lab 03/01/18 0619  NA 145  K 3.8  CL 117*  CO2 24  BUN 43*  ALBUMIN 2.7*  ALKPHOS 38  ALT 11  AST 15  GLUCOSE 117*   Recent Labs  Lab 03/01/18 0711  INR 1.55    @ASSESSMENTPLANBEGIN @ Assessment:  Hematemesis  Acute blood loss anemia.  Heme globin 12.4 on admission, lowest has been 7.3.  Dieulafoys lesion bleeding on upper endoscopy yesterday, treated with submucosal epinephrine injection and 2 hemoclips with good control.  He does not appear to be rebleeding at this point.  Mild consumptive coagulopathy and expected to  resolve without intervention after control of the GI bleeding.  Atrial fibrillation, admitted on oral anticoagulation, now discontinued because of GI bleeding.  Plan: Advance to soft diet today  Every 12 hours CBC x2  Dr. Havery Moros will round on the patient tomorrow.  If he is doing well he can most likely be discharged at that point.  I spoke with the [facility] hospitalist, Dr.Grunz, and advised that this patient stay off oral anticoagulation for 2 weeks until this lesion is likely to have fully healed.  This assumes the patient does not have rebleeding in the meantime.  He will need a total of 6 weeks of twice daily oral PPI upon discharge.  He should stay on IV Protonix least until tomorrow when he is evaluated by our team.  [facility] service will also order a dose of IV iron and he will need additional oral iron after discharge.  Total time 25 minutes, over half spent face-to-face with patient, including answering all of his questions.  [name] Office: [phone]

## 2018-03-02 NOTE — Progress Notes (Addendum)
PROGRESS NOTE  [name]  BOF:[id number] DOB: [birth year] DOA: 02/28/2018 PCP: Flossie Buffy, NP   Brief Narrative: [name] is a 77 y.o. male with a history of AFib on pradaxa, stage III CKD, HTN, meningioma who presented to the ED with hematochezia and melena associated with crampy abdominal pain followed by an episode of hematemesis. Hgb found to be 12.4, platelets 151, BUN 52, Cr 1.41, INR 1.82, +FOBT x2. GI was consulted, pradaxa held, and pt admitted. Hemoglobin trended down steadily with ongoing clinical evidence of GI bleeding including a syncopal episode in the bathroom overnight to 7.7 this PM. EGD revealed actively bleeding dieulafoy gastric lesion treated with epinephrine and clipping 7/27. 1u PRBCs ordered and serial CBC to continue.  Assessment & Plan: Principal Problem:   Acute GI bleeding Active Problems:   Squamous cell carcinoma   Essential hypertension   Chronic atrial fibrillation (HCC)   Prostate cancer screening   Chronic anticoagulation   Symptomatic anemia   Elevated PSA   Thrombocytopenia (HCC)   Acute blood loss anemia   Gastric hemorrhage due to Dieulafoy lesion of stomach  Acute blood loss anemia: Due to GI bleeding as below. Does not appear to have rebled since EGD. Hgb 7.8 this AM - Continue serial CBC. Transfuse for hgb 7.5 or less or if rebleeding.  - Keep IV access x2.  - Holding anticoagulation  - Suspect with 5g/dl drop in hgb, he will need iron support for replenishing stores. Will give IV iron x1. Will ultimately start po iron but only when dark stools would be less likely to represent bleeding.  Gastric dieulafoy lesion: s/p epinephrine injection, clipping x2 (1 failed) with achieved hemostasis at time of EGD 7/27.  - Continue liquid diet per GI - Continue PPI gtt for at least 24 more hours.  AFib:  - Continue metoprolol, diltiazem - Hold anticoagulation for at least 2 weeks, consider restart on/around 8/11 if no further bleeding  per GI.  HTN: Unsure if arterial HTN should be more aggressively managed in face of dieulafoy lesion, but fortunately not hypotensive. - Continue metoprolol, diltiazem for rate control. Hold parameter for home hydralazine for MAP < 47mmHg.  Stage III CKD: SCr 1.22, CrCl nearly 103ml/min - Avoid hypotension, holding ACE - Avoid nephrotoxins, of course including NSAIDs - Monitor metabolic panel tomorrow  Thrombocytopenia: Chronic, revealed with fluid resuscitation.  - Monitor closely, outpatient follow up.   BPH:  - Continue flomax  Hyperlipidemia:  - Continue statin  TBili elevation: Doubt this is significant finding, resolved. Not consistent with hemolytic process.  DVT prophylaxis: SCDs Code Status: Full Family Communication: None at bedside this AM Disposition Plan: Stable for transfer back to telemetry  Consultants:   GI  Procedures:   EGD 03/01/2018 by Dr. Fuller Plan: Impression:       - Normal esophagus.                           - Dieulafoy lesion of stomach.                           - Red blood in the entire stomach.                           - Normal examined duodenum.                           -  No specimens collected. Recommendation:- Return patient to hospital ward for ongoing care.                           - Clear liquid diet.                           - Continue present medications.                           - Serial hemoglobin and hematocrit Q 6 hrs and                            transfuse for Hgb < or = 7.5  Antimicrobials:  None   Subjective: No further bleeding/stools. No abd pain, N/V/D. Hungry.  Objective: Vitals:   03/02/18 0017 03/02/18 0451 03/02/18 0850 03/02/18 1002  BP: 115/73 130/81 105/62   Pulse:  90 97   Resp: 14 10 15    Temp: 98.4 F (36.9 C) 98 F (36.7 C)  98.5 F (36.9 C)  TempSrc:  Oral  Oral  SpO2:  100% 99%   Weight:  92.4 kg (203 lb 11.3 oz)    Height:        Intake/Output Summary (Last 24 hours) at 03/02/2018  1342 Last data filed at 03/02/2018 0926 Gross per 24 hour  Intake 517.44 ml  Output 1675 ml  Net -1157.56 ml   Filed Weights   03/01/18 0319 03/01/18 1120 03/02/18 0451  Weight: 96.7 kg (213 lb 3 oz) 96.7 kg (213 lb 3 oz) 92.4 kg (203 lb 11.3 oz)   Gen: 77 y.o. male in no distress Pulm: Nonlabored breathing room air. Clear. CV: Irreg, rate in high 80's. No murmur, rub, or gallop. No JVD, no dependent edema. GI: Abdomen soft, non-tender, non-distended, with normoactive bowel sounds.  Ext: Warm, no deformities Skin: No rashes, lesions or ulcers on visualized skin.  Neuro: Alert and oriented. No focal neurological deficits. Psych: Judgement and insight appear fair. Mood euthymic & affect congruent. Behavior normal.  Data Reviewed: I have personally reviewed following labs and imaging studies  CBC: Recent Labs  Lab 03/01/18 0619 03/01/18 1324 03/01/18 2101 03/02/18 0141 03/02/18 0648  WBC 9.6 8.9 7.5 8.0 5.7  HGB 7.8* 7.7* 8.0* 7.3* 7.8*  HCT 23.8* 24.2* 25.0* 22.5* 23.6*  MCV 96.0 96.8 95.4 95.3 94.8  PLT 123* 121* 111* 114* 893*   Basic Metabolic Panel: Recent Labs  Lab 02/28/18 1016 03/01/18 0619  NA 143 145  K 4.6 3.8  CL 112* 117*  CO2 24 24  GLUCOSE 158* 117*  BUN 52* 43*  CREATININE 1.41* 1.22  CALCIUM 9.0 8.1*   GFR: Estimated Creatinine Clearance: 59.6 mL/min (by C-G formula based on SCr of 1.22 mg/dL). Liver Function Tests: Recent Labs  Lab 02/28/18 1016 03/01/18 0619  AST 29 15  ALT 13 11  ALKPHOS 60 38  BILITOT 1.8* 1.0  PROT 6.2* 4.6*  ALBUMIN 3.6 2.7*   Coagulation Profile: Recent Labs  Lab 02/28/18 1139 03/01/18 0711  INR 1.82 1.55   Urine analysis:    Component Value Date/Time   COLORURINE STRAW (A) 02/28/2018 1342   APPEARANCEUR CLEAR 02/28/2018 1342   LABSPEC 1.013 02/28/2018 1342   PHURINE 5.0 02/28/2018 1342   GLUCOSEU NEGATIVE 02/28/2018 1342   HGBUR NEGATIVE 02/28/2018 1342   [place]  02/28/2018 1342    BILIRUBINUR neg 10/23/2017 0955   KETONESUR 5 (A) 02/28/2018 1342   PROTEINUR NEGATIVE 02/28/2018 1342   UROBILINOGEN 1.0 10/23/2017 0955   UROBILINOGEN 0.2 08/27/2014 1345   NITRITE NEGATIVE 02/28/2018 1342   LEUKOCYTESUR NEGATIVE 02/28/2018 1342    Time spent: 35 minutes.  Patrecia Pour, MD [facility] [email or web address] Password [email or web address] 03/02/2018, 1:42 PM

## 2018-03-03 ENCOUNTER — Other Ambulatory Visit: Payer: Self-pay | Admitting: Physician Assistant

## 2018-03-03 ENCOUNTER — Encounter (HOSPITAL_COMMUNITY): Payer: Self-pay | Admitting: Gastroenterology

## 2018-03-03 DIAGNOSIS — E876 Hypokalemia: Secondary | ICD-10-CM

## 2018-03-03 DIAGNOSIS — D62 Acute posthemorrhagic anemia: Secondary | ICD-10-CM

## 2018-03-03 LAB — BASIC METABOLIC PANEL
Anion gap: 10 (ref 5–15)
BUN: 16 mg/dL (ref 8–23)
CALCIUM: 8.3 mg/dL — AB (ref 8.9–10.3)
CO2: 24 mmol/L (ref 22–32)
CREATININE: 1.26 mg/dL — AB (ref 0.61–1.24)
Chloride: 109 mmol/L (ref 98–111)
GFR calc Af Amer: >60 mL/min (ref >60)
GFR, EST NON AFRICAN AMERICAN: 53 mL/min — AB (ref >60)
GLUCOSE: 95 mg/dL (ref 70–99)
Potassium: 3.1 mmol/L — ABNORMAL LOW (ref 3.5–5.1)
Sodium: 143 mmol/L (ref 135–145)

## 2018-03-03 LAB — CBC
HCT: 22.8 % — ABNORMAL LOW (ref 39.0–52.0)
Hemoglobin: 7.6 g/dL — ABNORMAL LOW (ref 13.0–17.0)
MCH: 31.5 pg (ref 26.0–34.0)
MCHC: 33.3 g/dL (ref 30.0–36.0)
MCV: 94.6 fL (ref 78.0–100.0)
PLATELETS: 122 10*3/uL — AB (ref 150–400)
RBC: 2.41 MIL/uL — ABNORMAL LOW (ref 4.22–5.81)
RDW: 14.5 % (ref 11.5–15.5)
WBC: 5.8 10*3/uL (ref 4.0–10.5)

## 2018-03-03 MED ORDER — PANTOPRAZOLE SODIUM 40 MG PO TBEC: 40.0000 mg | DELAYED_RELEASE_TABLET | Freq: Two times a day (BID) | ORAL | 0 refills | Status: DC

## 2018-03-03 NOTE — Progress Notes (Signed)
Visited with patient and gave advanced directives info.  He is ready to be discharged and asked to take forms with him. He can go to a bank or other place with a notary available and complete these forms if he chooses.  Conard Novak, Chaplain   03/03/18 1100  Clinical Encounter Type  Visited With Patient  Visit Type Initial;Other (Comment) (AD forms for patient-he is getting ready to discharge)  Referral From Physician  Consult/Referral To Chaplain  Spiritual Encounters  Spiritual Needs Literature;Prayer  Stress Factors  Patient Stress Factors Not reviewed  Family Stress Factors Not reviewed

## 2018-03-03 NOTE — Progress Notes (Signed)
Patient was discharged home by MD order; discharged instructions review and give to patient and his wife with care notes; IV DIC; skin intact; patient will be escorted to the car by nurse tech via wheelchair.

## 2018-03-03 NOTE — Discharge Summary (Signed)
Physician Discharge Summary  ZACARY BAUER VXB:[id number] DOB: October 30, 1940 DOA: 02/28/2018  PCP: Flossie Buffy, NP  Admit date: 02/28/2018 Discharge date: 03/03/2018  Admitted From: Home Disposition: Home   Recommendations for Outpatient Follow-up:  1. Follow up with PCP in 1-2 weeks. Repeat CBC in 1 week (arranged).  2. Follow up with GI as scheduled 8/19 below. 3. Continue protonix 40mg  po BID x6 weeks, then 40mg  po daily.  4. Consider restarting anticoagulation in 2 weeks.  Home Health: None Equipment/Devices: None Discharge Condition: Stable CODE STATUS: Full Diet recommendation: Advance slowly as tolerated to heart healthy  Brief/Interim Summary: [name] is a 77 y.o. male with a history of AFib on pradaxa, stage III CKD, HTN, meningioma who presented to the ED with hematochezia and melena associated with crampy abdominal pain followed by an episode of hematemesis. Hgb found to be 12.4, platelets 151, BUN 52, Cr 1.41, INR 1.82, +FOBT x2. GI was consulted, pradaxa held, and pt admitted. Hemoglobin trended down steadily with ongoing clinical evidence of GI bleeding including a syncopal episode. EGD revealed actively bleeding dieulafoy gastric lesion treated with epinephrine and clipping 7/27. 1u PRBCs ordered and serial CBC demonstrated stable hgb and no further clinical evidence of bleeding.  Discharge Diagnoses:  Principal Problem:   Acute GI bleeding Active Problems:   Squamous cell carcinoma   Essential hypertension   Chronic atrial fibrillation (HCC)   Prostate cancer screening   Chronic anticoagulation   Symptomatic anemia   Elevated PSA   Thrombocytopenia (HCC)   Acute blood loss anemia   Gastric hemorrhage due to Dieulafoy lesion of stomach  Acute blood loss anemia: Due to GI bleeding as below. Does not appear to have rebled since EGD. Hgb 7.8 this AM - Continue serial CBC. Transfuse for hgb 7.5 or less or if rebleeding.  - Keep IV access x2.  - Holding  anticoagulation  - Suspect with 5g/dl drop in hgb, he will need iron support for replenishing stores. Gave IV iron x1. Will ultimately start po iron but only when dark stools would be less likely to represent bleeding. This was discussed with GI. They will follow up 8/19.  Gastric dieulafoy lesion: s/p epinephrine injection, clipping x2 (1 failed) with achieved hemostasis at time of EGD 7/27.  - Advance diet per GI - Continue PPI BID x6 weeks, then daily  AFib:  - Continue metoprolol, diltiazem - Hold anticoagulation for at least 2 weeks, consider restart on/around 8/19 if no further bleeding per GI.  HTN: Unsure if arterial HTN should be more aggressively managed in face of dieulafoy lesion, but fortunately not hypotensive. - Continue metoprolol, diltiazem for rate control.    Stage III CKD: SCr 1.22, CrCl stable and nearly 67ml/min - Avoid hypotension, Ok to restart ACE - Avoid nephrotoxins, of course including NSAIDs - Monitor metabolic panel tomorrow  Thrombocytopenia: Chronic, revealed with fluid resuscitation.  - Monitor closely, outpatient follow up.   BPH:  - Continue flomax  Hyperlipidemia:  - Continue statin  TBili elevation: Doubt this is significant finding, resolved. Not consistent with hemolytic process.  Discharge Instructions Discharge Instructions    Diet - low sodium heart healthy   Complete by:  As directed    Discharge instructions   Complete by:  As directed    - Stop pradaxa until you follow up with GI  - Start taking protonix 40mg  twice daily for 6 weeks, then once daily - Avoid all NSAIDs (most OTC pain relievers except for tylenol),  you may take tylenol as needed for pain. - Follow up with your PCP in the next 1-2 weeks and follow up with GI as scheduled.  - If you notice bleeding, dark stools, abdominal pain, fainting or chest pain or shortness of breath, seek medical attention right away.   Increase activity slowly   Complete by:  As  directed      Allergies as of 03/03/2018      Reactions   Hctz [hydrochlorothiazide] Photosensitivity   Cipro Iv [ciprofloxacin] Hives, Rash   Caused red streaks up the arm      Medication List    STOP taking these medications   dabigatran 150 MG Caps capsule Commonly known as:  PRADAXA     TAKE these medications   albuterol 108 (90 Base) MCG/ACT inhaler Commonly known as:  PROAIR HFA Inhale 1-2 puffs into the lungs every 6 (six) hours as needed for wheezing or shortness of breath.   benazepril 20 MG tablet Commonly known as:  LOTENSIN Take 1 tablet (20 mg total) by mouth 2 (two) times daily.   Cranberry 1000 MG Caps Take 1,000 mg by mouth daily.   diltiazem 240 MG 24 hr capsule Commonly known as:  DILT-XR Take 1 capsule (240 mg total) by mouth daily.   furosemide 20 MG tablet Commonly known as:  LASIX Take 20 mg daily if needed for swelling   Garlic 3664 MG Caps Take 1 capsule by mouth daily.   hydrALAZINE 25 MG tablet Commonly known as:  APRESOLINE Take 1 tablet (25 mg total) by mouth 3 (three) times daily.   metoprolol tartrate 50 MG tablet Commonly known as:  LOPRESSOR Take 1 tablet (50 mg total) by mouth 2 (two) times daily.   multivitamin tablet Take 1 tablet by mouth daily.   pantoprazole 40 MG tablet Commonly known as:  PROTONIX Take 1 tablet (40 mg total) by mouth 2 (two) times daily.   potassium chloride SA 20 MEQ tablet Commonly known as:  KLOR-CON M20 Take 1 tablet (20 mEq total) by mouth daily.   tamsulosin 0.4 MG Caps capsule Commonly known as:  FLOMAX Take 0.4 mg by mouth daily after supper.   VIAGRA 100 MG tablet Generic drug:  sildenafil Take 100 mg by mouth daily as needed for erectile dysfunction.      Follow-up Information    Zehr, Laban Emperor, PA-C Follow up on 03/24/2018.   Specialty:  Gastroenterology Why:  2 PM for follow up of GI bleeding.   Contact information: [place] [address] [phone]         [facility] Follow up.   Why:  please go to lab at [facility], early to mid week of 8/1.  hours are 7:30 AM to 5:30 PM Contact information: [place] [address]       Nche, Charlene Brooke, NP Follow up.   Specialty:  Internal Medicine Contact information: [address] [place]  [address] [phone]          Allergies  Allergen Reactions  . Hctz [Hydrochlorothiazide] Photosensitivity  . Cipro Iv [Ciprofloxacin] Hives and Rash    Caused red streaks up the arm    Consultations:  GI  Procedures/Studies:  EGD 03/01/2018 by Dr. Fuller Plan: Impression: - Normal esophagus. - Dieulafoy lesion of stomach. - Red blood in the entire stomach. - Normal examined duodenum. - No specimens collected. Recommendation:- Return patient to hospital ward for ongoing care. - Clear liquid diet. -  Continue present medications. - Serial hemoglobin and hematocrit Q 6 hrs and  transfuse for Hgb <or = 7.5  Subjective: No further bleeding, denies dark stools, abd pain, N/V/D. Eating well.   Discharge Exam: Vitals:   03/02/18 2110 03/03/18 0437  BP: 128/75 127/80  Pulse: (!) 58 71  Resp: 16 18  Temp: 97.9 F (36.6 C) 97.7 F (36.5 C)  SpO2: 100% 100%   General: Pt is alert, awake, not in acute distress Cardiovascular: Irreg, S1/S2 +, no rubs, no gallops Respiratory: CTA bilaterally, no wheezing, no rhonchi Abdominal: Soft, NT, ND, bowel sounds + Extremities: No edema, no cyanosis  Labs: Basic Metabolic Panel: Recent Labs  Lab 02/28/18 1016 03/01/18 0619 03/03/18 0535  NA 143 145 143  K 4.6 3.8 3.1*  CL 112* 117* 109  CO2 24 24 24   GLUCOSE 158* 117* 95  BUN 52* 43* 16  CREATININE 1.41* 1.22 1.26*  CALCIUM  9.0 8.1* 8.3*   Liver Function Tests: Recent Labs  Lab 02/28/18 1016 03/01/18 0619  AST 29 15  ALT 13 11  ALKPHOS 60 38  BILITOT 1.8* 1.0  PROT 6.2* 4.6*  ALBUMIN 3.6 2.7*   CBC: Recent Labs  Lab 03/01/18 2101 03/02/18 0141 03/02/18 0648 03/02/18 2046 03/03/18 0535  WBC 7.5 8.0 5.7 5.8 5.8  HGB 8.0* 7.3* 7.8* 7.4* 7.6*  HCT 25.0* 22.5* 23.6* 23.1* 22.8*  MCV 95.4 95.3 94.8 95.9 94.6  PLT 111* 114* 113* 117* 122*   Urinalysis    Component Value Date/Time   COLORURINE STRAW (A) 02/28/2018 1342   APPEARANCEUR CLEAR 02/28/2018 1342   LABSPEC 1.013 02/28/2018 1342   PHURINE 5.0 02/28/2018 1342   GLUCOSEU NEGATIVE 02/28/2018 1342   HGBUR NEGATIVE 02/28/2018 1342   BILIRUBINUR NEGATIVE 02/28/2018 1342   BILIRUBINUR neg 10/23/2017 0955   KETONESUR 5 (A) 02/28/2018 1342   PROTEINUR NEGATIVE 02/28/2018 1342   UROBILINOGEN 1.0 10/23/2017 0955   UROBILINOGEN 0.2 08/27/2014 1345   NITRITE NEGATIVE 02/28/2018 1342   LEUKOCYTESUR NEGATIVE 02/28/2018 1342   Time coordinating discharge: Approximately 40 minutes  Patrecia Pour, MD  [facility] 03/03/2018, 2:12 PM Pager [phone]

## 2018-03-03 NOTE — Evaluation (Signed)
Physical Therapy Evaluation Patient Details Name: [name] MRN: [id number] DOB: November 28, 1940 Today's Date: 03/03/2018   History of Present Illness  Pt is a 77 y/o male with a history of a-fib, CKD III, HTN, meningioma who presented to the ED with hematochezia, melena, abdominal pain, and hematemesis. Hg trended down from 12.4 g/dL to 7.1 g/dL and pt had a syncopal episode. EGD revealed actively bleeding dieulafoy gastric lesion treated with epinephrine and clipping 7/27.  Clinical Impression  Pt admitted with above diagnosis. Pt currently with functional limitations due to the deficits listed below (see PT Problem List). At the time of PT eval pt was able to perform transfers and ambulation with gross min guard assist for balance support and safety. Noted increase in balance deficits with addition of head turns during gait training. Recommended use of SPC and wife supervision initially upon d/c to minimize risk for falls. Of note, pt reports he has not been out of the bed since 7/26. Anticipate pt will progress well with balance and mobility the more he is OOB. Acutely, pt will benefit from skilled PT to increase their independence and safety with mobility to allow discharge to the venue listed below.     Follow Up Recommendations No PT follow up;Supervision for mobility/OOB    Equipment Recommendations  None recommended by PT    Recommendations for Other Services       Precautions / Restrictions Precautions Precautions: Fall Restrictions Weight Bearing Restrictions: No      Mobility  Bed Mobility Overal bed mobility: Independent                Transfers Overall transfer level: Needs assistance Equipment used: None Transfers: Sit to/from Stand Sit to Stand: Min guard         General transfer comment: min guard for safety upon standing due to unsteadiness  Ambulation/Gait Ambulation/Gait assistance: Min assist Gait Distance (Feet): 300 Feet Assistive device:  None Gait Pattern/deviations: Step-through pattern;Staggering right;Drifts right/left Gait velocity: decreased Gait velocity interpretation: 1.31 - 2.62 ft/sec, indicative of limited community ambulator General Gait Details: pt unsteady upon initiation of gait staggering right, requiring min assist to regain balance. Pt demonstates unsteady gate through out session, occasionally needing assistance from wall railing. Also deviated to the side head rotated to when looking L and R in the hallway (noting more drifting when head turned to the right).   Stairs Stairs: Yes Stairs assistance: Min guard Stair Management: One rail Right;Alternating pattern;Step to pattern;Forwards Number of Stairs: 5 General stair comments: Pt ascended and descended stairs with alternating pattern but due to unsteadiness cued to navigate stairs with a step to pattern.  Wheelchair Mobility    Modified Rankin (Stroke Patients Only)       Balance Overall balance assessment: Needs assistance Sitting-balance support: Feet supported;No upper extremity supported Sitting balance-Leahy Scale: Poor Sitting balance - Comments: Posterior lean noted with LE MMT   Standing balance support: No upper extremity supported;During functional activity Standing balance-Leahy Scale: Poor Standing balance comment: Dynamically noted mild balance deficits                             Pertinent Vitals/Pain Pain Assessment: No/denies pain    Home Living Family/patient expects to be discharged to:: Private residence Living Arrangements: Spouse/significant other Available Help at Discharge: Family Type of Home: House Home Access: Ramped entrance     Home Layout: Two level;Full bath on main level;Bed/bath upstairs;Able to live  on main level with bedroom/bathroom Home Equipment: Walker - 2 wheels;Cane - single point;Bedside commode;Grab bars - tub/shower;Wheelchair - Brewing technologist - built in Additional Comments:  handicap bath downstairs    Prior Function Level of Independence: Independent               Hand Dominance   Dominant Hand: Right    Extremity/Trunk Assessment   Upper Extremity Assessment Upper Extremity Assessment: Overall WFL for tasks assessed    Lower Extremity Assessment Lower Extremity Assessment: Overall WFL for tasks assessed    Cervical / Trunk Assessment Cervical / Trunk Assessment: Other exceptions Cervical / Trunk Exceptions: Forward head posture with rounded shoulders  Communication      Cognition Arousal/Alertness: Awake/alert Behavior During Therapy: WFL for tasks assessed/performed Overall Cognitive Status: Within Functional Limits for tasks assessed                                 General Comments: Mild decreased safety awareness noted.       General Comments      Exercises     Assessment/Plan    PT Assessment Patient needs continued PT services  PT Problem List Decreased activity tolerance;Decreased balance;Decreased mobility;Decreased knowledge of use of DME;Decreased safety awareness;Decreased knowledge of precautions       PT Treatment Interventions DME instruction;Gait training;Stair training;Therapeutic activities;Functional mobility training;Therapeutic exercise;Neuromuscular re-education;Patient/family education    PT Goals (Current goals can be found in the Care Plan section)  Acute Rehab PT Goals Patient Stated Goal: "I hope I can go home today." PT Goal Formulation: With patient Time For Goal Achievement: 03/10/18 Potential to Achieve Goals: Good    Frequency Min 3X/week   Barriers to discharge        Co-evaluation               AM-PAC PT "6 Clicks" Daily Activity  Outcome Measure Difficulty turning over in bed (including adjusting bedclothes, sheets and blankets)?: None Difficulty moving from lying on back to sitting on the side of the bed? : None Difficulty sitting down on and standing up from a  chair with arms (e.g., wheelchair, bedside commode, etc,.)?: A Little Help needed moving to and from a bed to chair (including a wheelchair)?: A Little Help needed walking in hospital room?: A Little Help needed climbing 3-5 steps with a railing? : A Little 6 Click Score: 20    End of Session Equipment Utilized During Treatment: Gait belt Activity Tolerance: Patient tolerated treatment well Patient left: in chair;with call bell/phone within reach Nurse Communication: Mobility status PT Visit Diagnosis: Unsteadiness on feet (R26.81)    Time: 4034-7425 PT Time Calculation (min) (ACUTE ONLY): 32 min   Charges:   PT Evaluation $PT Eval Moderate Complexity: 1 Mod PT Treatments $Gait Training: 8-22 mins        Rolinda Roan, PT, DPT Acute Rehabilitation Services Pager: [place] 03/03/2018, 2:53 PM

## 2018-03-03 NOTE — Progress Notes (Signed)
          Daily Rounding Note  03/03/2018, 8:09 AM  LOS: 2 days   SUBJECTIVE:   No stools > 24 hours.  On clears.   No n/v, emesis.  Feels well.  No abd pain.  No dizziness, SOB, chest pain.    OBJECTIVE:         Vital signs in last 24 hours:    Temp:  [97.7 F (36.5 C)-98.5 F (36.9 C)] 97.7 F (36.5 C) (07/29 0437) Pulse Rate:  [54-97] 71 (07/29 0437) Resp:  [13-18] 18 (07/29 0437) BP: (105-128)/(61-82) 127/80 (07/29 0437) SpO2:  [97 %-100 %] 100 % (07/29 0437) Weight:  [210 lb 8.6 oz (95.5 kg)] 210 lb 8.6 oz (95.5 kg) (07/29 0437) Last BM Date: 03/02/18 Filed Weights   03/01/18 1120 03/02/18 0451 03/03/18 0437  Weight: 213 lb 3 oz (96.7 kg) 203 lb 11.3 oz (92.4 kg) 210 lb 8.6 oz (95.5 kg)   General: looks well.  Alert, NAD   Heart: Irreg irreg.  Rate controlled Chest: clear bil.  No labored breathing or cough Abdomen: soft, NT, active BS, ND  Extremities: no CCE Neuro/Psych:  Alert.  Appropriate.  Oriented x 3.  Moves all 4s without obvious deficet, weakness or tremors.    Intake/Output from previous day: 07/28 0701 - 07/29 0700 In: 721.1 [I.V.:604.1; IV Piggyback:117] Out: 1975 [OXBDZ:3299]  Intake/Output this shift: Total I/O In: -  Out: 300 [Urine:300]  Lab Results: Recent Labs    03/02/18 0648 03/02/18 2046 03/03/18 0535  WBC 5.7 5.8 5.8  HGB 7.8* 7.4* 7.6*  HCT 23.6* 23.1* 22.8*  PLT 113* 117* 122*   BMET Recent Labs    02/28/18 1016 03/01/18 0619 03/03/18 0535  NA 143 145 143  K 4.6 3.8 3.1*  CL 112* 117* 109  CO2 24 24 24   GLUCOSE 158* 117* 95  BUN 52* 43* 16  CREATININE 1.41* 1.22 1.26*  CALCIUM 9.0 8.1* 8.3*   LFT Recent Labs    02/28/18 1016 03/01/18 0619  PROT 6.2* 4.6*  ALBUMIN 3.6 2.7*  AST 29 15  ALT 13 11  ALKPHOS 60 38  BILITOT 1.8* 1.0   PT/INR Recent Labs    02/28/18 1139 03/01/18 0711  LABPROT 20.9* 18.5*  INR 1.82 1.55   Hepatitis Panel No results for  input(s): HEPBSAG, HCVAB, HEPAIGM, HEPBIGM in the last 72 hours.  Studies/Results: No results found.  ASSESMENT:   *   Hematemesis.  Resolved.   7/27 EGD: Dieulafoy's lesion, inected with epi and endoclipped x 2.    *   ABL anemia. S/p 1 U PRBC.   Feraheme x 1 7/28.  Hgb stable.  Remains anemic but tolerating current Hgb.     *   Thrombocytopenia.    *  A fib.  Pradaxa off for now.   *   Hypokalemia.     PLAN   *  ROV with PA Zehr on 8/19.    *  Ordered CBC for next week at office lab.     *  Protonix 40 BID x 6 weeks, then 1 x daily.    *  If Hgb stable and no contraindications, resume Pradaxa 2 weeks ~ 7/11    Azucena Freed  03/03/2018, 8:09 AM Phone [phone]

## 2018-03-04 ENCOUNTER — Encounter: Payer: Self-pay | Admitting: Internal Medicine

## 2018-03-04 ENCOUNTER — Telehealth: Payer: Self-pay | Admitting: Behavioral Health

## 2018-03-04 NOTE — Telephone Encounter (Signed)
Spoke with patient post hospital discharge. He voiced feeling pretty good after being admitted for an internal bleed. Patient stated, that he loss a lot of blood, but hemoglobin levels have come back up. RN offered to schedule patient a hospital follow-up visit with Wilfred Lacy, NP. The patient opted to hold off on scheduling at this time; he would like to review his discharge summary & then give the office a call back.

## 2018-03-13 ENCOUNTER — Ambulatory Visit (INDEPENDENT_AMBULATORY_CARE_PROVIDER_SITE_OTHER): Payer: Medicare Other | Admitting: Nurse Practitioner

## 2018-03-13 ENCOUNTER — Encounter: Payer: Self-pay | Admitting: Nurse Practitioner

## 2018-03-13 VITALS — BP 120/78 | HR 59 | Temp 97.8°F | Ht 75.0 in | Wt 222.0 lb

## 2018-03-13 DIAGNOSIS — D696 Thrombocytopenia, unspecified: Secondary | ICD-10-CM

## 2018-03-13 DIAGNOSIS — I10 Essential (primary) hypertension: Secondary | ICD-10-CM | POA: Diagnosis not present

## 2018-03-13 DIAGNOSIS — N183 Chronic kidney disease, stage 3 unspecified: Secondary | ICD-10-CM

## 2018-03-13 DIAGNOSIS — K3182 Dieulafoy lesion (hemorrhagic) of stomach and duodenum: Secondary | ICD-10-CM

## 2018-03-13 DIAGNOSIS — Z7901 Long term (current) use of anticoagulants: Secondary | ICD-10-CM

## 2018-03-13 LAB — BASIC METABOLIC PANEL
BUN: 31 mg/dL — ABNORMAL HIGH (ref 6–23)
CALCIUM: 9 mg/dL (ref 8.4–10.5)
CO2: 24 meq/L (ref 19–32)
Chloride: 108 mEq/L (ref 96–112)
Creatinine, Ser: 1.32 mg/dL (ref 0.40–1.50)
GFR: 55.73 mL/min — ABNORMAL LOW (ref >60.00)
GLUCOSE: 99 mg/dL (ref 70–99)
Potassium: 4 mEq/L (ref 3.5–5.1)
SODIUM: 140 meq/L (ref 135–145)

## 2018-03-13 LAB — CBC
HEMATOCRIT: 28.6 % — AB (ref 39.0–52.0)
Hemoglobin: 9.6 g/dL — ABNORMAL LOW (ref 13.0–17.0)
MCHC: 33.4 g/dL (ref 30.0–36.0)
MCV: 97.2 fl (ref 78.0–100.0)
Platelets: 197 10*3/uL (ref 150.0–400.0)
RBC: 2.94 Mil/uL — AB (ref 4.22–5.81)
RDW: 17.9 % — AB (ref 11.5–15.5)
WBC: 5.3 10*3/uL (ref 4.0–10.5)

## 2018-03-13 NOTE — Patient Instructions (Addendum)
Resume furosemide and potassium once a day x 1week.  Improved BMP and cbc.  I will correspond with Dr. Martinique about time to resume pradaxa.  Maintain appt with GI 03/15/2018.

## 2018-03-13 NOTE — Progress Notes (Signed)
Subjective:  Patient ID: [name], male    DOB: 1941-05-07  Age: 77 y.o. MRN: [id number]  CC: Hospitalization Follow-up (ED follow up/GI bleeding/ patient is getting better. patient has an appt with GI on 03/25/18)  HPI  accompanied by wife. TCM call done 03/04/2018. Admission: 02/28/2018 Discharge: 03/03/2018 Appt with GI: 03/25/2018.  [name] was admitted with acute GI bleed, Onset with bloating, rectal urgency, nausea with hematemesis, diarrhea with melena, and hematochezia. During admission endoscopy was performed. Bleeding was found to be caused by dieulafoy gastric lesion. This was clipped and injected with epinephrine. 1PRBC was also transfused due to drop in Hgb/Hct (12.4 to 7.3/38.3 to 22.8). He was discharge with H/H of 7.6/22.8. Wife states they were instructed to hold pradaxa for 6 weeks Discharge summary indicates pradaxa to be heald for 2weeks which will be till 03/15/2018. Reviewed labs and endoscopy report from hospital.  He denies any acute complains today. Reports feeling better. BM normal, appetite normal, normal urination, no syncope or dizziness, no palpitations, no chest pain, no SOB, no fatigue. No orthopnea   I noted 12lbs weight gain since hospital discharge. He reports he has not been taking furosemide and has been eating more that usual.  Reviewed past Medical, Social and Family history today.  Medication list reconciled with patient and his wife.  Outpatient Medications Prior to Visit  Medication Sig Dispense Refill  . albuterol (PROAIR HFA) 108 (90 Base) MCG/ACT inhaler Inhale 1-2 puffs into the lungs every 6 (six) hours as needed for wheezing or shortness of breath. 1 Inhaler 0  . benazepril (LOTENSIN) 20 MG tablet Take 1 tablet (20 mg total) by mouth 2 (two) times daily. 180 tablet 3  . Cranberry 1000 MG CAPS Take 1,000 mg by mouth daily.    Marland Kitchen diltiazem (DILT-XR) 240 MG 24 hr capsule Take 1 capsule (240 mg total) by mouth daily. 90 capsule 3  .  furosemide (LASIX) 20 MG tablet Take 20 mg daily if needed for swelling 90 tablet 3  . Garlic 5400 MG CAPS Take 1 capsule by mouth daily.    . hydrALAZINE (APRESOLINE) 25 MG tablet Take 1 tablet (25 mg total) by mouth 3 (three) times daily. 270 tablet 3  . metoprolol tartrate (LOPRESSOR) 50 MG tablet Take 1 tablet (50 mg total) by mouth 2 (two) times daily. 180 tablet 3  . Multiple Vitamin (MULTIVITAMIN) tablet Take 1 tablet by mouth daily.    . pantoprazole (PROTONIX) 40 MG tablet Take 1 tablet (40 mg total) by mouth 2 (two) times daily. 60 tablet 0  . potassium chloride SA (KLOR-CON M20) 20 MEQ tablet Take 1 tablet (20 mEq total) by mouth daily. 90 tablet 3  . sildenafil (VIAGRA) 100 MG tablet Take 100 mg by mouth daily as needed for erectile dysfunction.     . tamsulosin (FLOMAX) 0.4 MG CAPS capsule Take 0.4 mg by mouth daily after supper.   0   No facility-administered medications prior to visit.     ROS See HPI  Objective:  BP 120/78   Pulse (!) 59   Temp 97.8 F (36.6 C) (Oral)   Ht 6\' 3"  (1.905 m)   Wt 222 lb (100.7 kg)   SpO2 99%   BMI 27.75 kg/m   BP Readings from Last 3 Encounters:  03/13/18 120/78  03/03/18 98/63  02/24/18 130/80    Wt Readings from Last 3 Encounters:  03/13/18 222 lb (100.7 kg)  03/03/18 210 lb 8.6 oz (95.5 kg)  02/24/18 218 lb (98.9 kg)    Physical Exam  Constitutional: He is oriented to person, place, and time. He appears well-developed and well-nourished.  Cardiovascular: Normal rate, normal heart sounds and intact distal pulses. An irregularly irregular rhythm present.  No murmur heard. Pulmonary/Chest: Effort normal and breath sounds normal. No respiratory distress. He has no rales.  Abdominal: Soft. Bowel sounds are normal. He exhibits no distension. There is no tenderness.  Musculoskeletal: He exhibits edema.  Neurological: He is alert and oriented to person, place, and time.  Skin: No rash noted. No erythema.  Psychiatric: He has a  normal mood and affect. His behavior is normal. Thought content normal.  Vitals reviewed.   Lab Results  Component Value Date   WBC 5.3 03/13/2018   HGB 9.6 (L) 03/13/2018   HCT 28.6 (L) 03/13/2018   PLT 197.0 03/13/2018   GLUCOSE 99 03/13/2018   CHOL 107 12/23/2017   TRIG 104.0 12/23/2017   HDL 41.40 12/23/2017   LDLCALC 45 12/23/2017   ALT 11 03/01/2018   AST 15 03/01/2018   NA 140 03/13/2018   K 4.0 03/13/2018   CL 108 03/13/2018   CREATININE 1.32 03/13/2018   BUN 31 (H) 03/13/2018   CO2 24 03/13/2018   TSH 1.63 05/20/2015   PSA 3.88 12/23/2017   INR 1.55 03/01/2018     Assessment & Plan:   Aarin was seen today for hospitalization follow-up.  Diagnoses and all orders for this visit:  Gastric hemorrhage due to Dieulafoy lesion of stomach -     CBC  Chronic anticoagulation -     CBC  Thrombocytopenia (HCC) -     CBC  Essential hypertension -     Basic metabolic panel  CKD (chronic kidney disease), stage III ([place]) -     Basic metabolic panel   I am having Charls R. Maple maintain his sildenafil, multivitamin, Garlic, tamsulosin, albuterol, benazepril, metoprolol tartrate, diltiazem, furosemide, potassium chloride SA, hydrALAZINE, Cranberry, and pantoprazole.  No orders of the defined types were placed in this encounter.   Follow-up: Return if symptoms worsen or fail to improve.  Wilfred Lacy, NP

## 2018-03-14 ENCOUNTER — Telehealth: Payer: Self-pay | Admitting: Nurse Practitioner

## 2018-03-14 ENCOUNTER — Encounter: Payer: Self-pay | Admitting: Nurse Practitioner

## 2018-03-14 NOTE — Telephone Encounter (Signed)
Pt is aware of message below. He will consult about this med during visit with the GI.

## 2018-03-14 NOTE — Telephone Encounter (Signed)
-----   Message from Peter M Martinique, MD sent at 03/14/2018 10:56 AM EDT ----- I think it is safe to hold for as long as GI feels is necessary.  Peter Martinique MD, [facility]  ----- Message ----- From: Flossie Buffy, NP Sent: 03/13/2018  12:29 PM EDT To: Peter M Martinique, MD  Hello Dr. Martinique, [name] was admitted to the hospital 02/28/2018 with acute GI bleed. It was found to be due to actively bleeding dieulafoy gastric lesion. It was injected with epinephrine and clipped. Pradaxa has been on hold since 03/01/2018. CBC is now stable and no sign of active GI bleed. He has f/up appt with Dr. Loletha Carrow 03/25/2018. Dr. Loletha Carrow recommended for Pradaxa to be held for at least 2weeks, but patient's wife reports she was instructed to hold for 6weeks. [name] CHADs score is 3. Will it be prudent to hold pradaxa for 6weeks?  Thank you in advance for your collaboration. Samuel Lacy, NP

## 2018-03-24 ENCOUNTER — Ambulatory Visit: Payer: Medicare Other | Admitting: Gastroenterology

## 2018-03-25 ENCOUNTER — Encounter: Payer: Self-pay | Admitting: Gastroenterology

## 2018-03-25 ENCOUNTER — Ambulatory Visit: Payer: Medicare Other | Admitting: Gastroenterology

## 2018-03-25 VITALS — BP 134/86 | HR 60 | Ht 73.33 in | Wt 218.4 lb

## 2018-03-25 DIAGNOSIS — K3182 Dieulafoy lesion (hemorrhagic) of stomach and duodenum: Secondary | ICD-10-CM | POA: Diagnosis not present

## 2018-03-25 DIAGNOSIS — D62 Acute posthemorrhagic anemia: Secondary | ICD-10-CM | POA: Diagnosis not present

## 2018-03-25 MED ORDER — PANTOPRAZOLE SODIUM 40 MG PO TBEC: 40.0000 mg | DELAYED_RELEASE_TABLET | Freq: Two times a day (BID) | ORAL | 2 refills | Status: DC

## 2018-03-25 NOTE — Progress Notes (Signed)
03/25/2018 SHRAVAN SALAHUDDIN [id number] 1940/11/24   HISTORY OF PRESENT ILLNESS:  This is a 77 year old male who was previously a patient of Dr. Kelby Fam, care being assumed by Dr. Loletha Carrow.  He was recently hospitalized for UGIB due to Dieulafoy lesion and blood loss anemia.  Had EGD on 7/27 and was treated with epi injection and hemoclip x 2.  Received one unit PRBC's and feraheme x 1.  Hgb is trending up and patient feels well without sign of bleeding.  Plan was to continue pantoprazole 40 mg BID for a total of 6 weeks then decrease to once daily.  Was on pradaxa for atrial fibrillation, recommended to hold for 2 weeks.  He feels good. Says that the past few days he has felt the best since his hospitalization and is about back to his regular activities.  Past Medical History:  Diagnosis Date  . Basal cell carcinoma   . Chronic atrial fibrillation ([place])   . Chronic dermatitis   . CKD (chronic kidney disease), stage III ([place])    a. Probable based on historical Cr. H/o ARF. Previously saw Dr. Florene Glen.  . Erectile dysfunction   . GI bleed   . Hypertension   . Hypokalemia   . Meningioma (HCC)    Probable 13 x 12 mm meningioma in right frontal region  . Thrombocytopenia ([place])    improved   Past Surgical History:  Procedure Laterality Date  . ESOPHAGOGASTRODUODENOSCOPY (EGD) WITH PROPOFOL N/A 03/01/2018   Procedure: ESOPHAGOGASTRODUODENOSCOPY (EGD) WITH PROPOFOL;  Surgeon: Doran Stabler, MD;  Location: [facility];  Service: Gastroenterology;  Laterality: N/A;  . EYE SURGERY     spot removed from cornea  . SCHLEROTHERAPY  03/01/2018   Procedure: SCHLEROTHERAPY OF VARICES;  Surgeon: Doran Stabler, MD;  Location: [facility];  Service: Gastroenterology;;  epinephrine 1:10,000 injection / hemostasis clips  . TEE WITH CARDIOVERSION  12/14/2004   Successful TEE guided electrical cardioversion. -- showed mild mitral insufficiency and trace pulmonic insufficiency   . TOENAIL EXCISION       ingrown toenail removal  . TONSILLECTOMY      reports that he quit smoking about 47 years ago. His smoking use included cigarettes. He has a 10.00 pack-year smoking history. He has never used smokeless tobacco. He reports that he does not drink alcohol or use drugs. family history includes Asthma in his mother; Bladder Cancer in his mother; Diabetes in his brother, mother, and sister; Heart disease in his mother; Heart failure in his mother; Hypertension in his father and sister; Pancreatic cancer in his father. Allergies  Allergen Reactions  . Hctz [Hydrochlorothiazide] Photosensitivity  . Cipro Iv [Ciprofloxacin] Hives and Rash    Caused red streaks up the arm      Outpatient Encounter Medications as of 03/25/2018  Medication Sig  . albuterol (PROAIR HFA) 108 (90 Base) MCG/ACT inhaler Inhale 1-2 puffs into the lungs every 6 (six) hours as needed for wheezing or shortness of breath.  . benazepril (LOTENSIN) 20 MG tablet Take 1 tablet (20 mg total) by mouth 2 (two) times daily.  . Cranberry 1000 MG CAPS Take 1,000 mg by mouth daily.  Marland Kitchen diltiazem (DILT-XR) 240 MG 24 hr capsule Take 1 capsule (240 mg total) by mouth daily.  . furosemide (LASIX) 20 MG tablet Take 20 mg daily if needed for swelling  . Garlic 2355 MG CAPS Take 1 capsule by mouth daily.  . hydrALAZINE (APRESOLINE) 25 MG tablet Take  1 tablet (25 mg total) by mouth 3 (three) times daily.  . metoprolol tartrate (LOPRESSOR) 50 MG tablet Take 1 tablet (50 mg total) by mouth 2 (two) times daily.  . Multiple Vitamin (MULTIVITAMIN) tablet Take 1 tablet by mouth daily.  . pantoprazole (PROTONIX) 40 MG tablet Take 1 tablet (40 mg total) by mouth 2 (two) times daily.  . potassium chloride SA (KLOR-CON M20) 20 MEQ tablet Take 1 tablet (20 mEq total) by mouth daily.  . sildenafil (VIAGRA) 100 MG tablet Take 100 mg by mouth daily as needed for erectile dysfunction.   . tamsulosin (FLOMAX) 0.4 MG CAPS capsule Take 0.4 mg by mouth daily after  supper.    No facility-administered encounter medications on file as of 03/25/2018.      REVIEW OF SYSTEMS  : All other systems reviewed and negative except where noted in the History of Present Illness.   PHYSICAL EXAM: BP 134/86 (BP Location: Left Arm, Patient Position: Sitting, Cuff Size: Normal)   Pulse 60   Ht 6' 1.33" (1.863 m) Comment: height measured without shoes  Wt 218 lb 6 oz (99.1 kg)   BMI 28.55 kg/m  General: Well developed white male in no acute distress Head: Normocephalic and atraumatic Eyes:  Sclerae anicteric, conjunctiva pink. Ears: Normal auditory acuity Lungs: Clear throughout to auscultation; no increased WOB. Heart: Regular rate and rhythm; no M/R/G. Abdomen: Soft, non-distended.  BS present.  Non-tender. Musculoskeletal: Symmetrical with no gross deformities  Skin: No lesions on visible extremities Extremities: No edema  Neurological: Alert oriented x 4, grossly non-focal Psychological:  Alert and cooperative. Normal mood and affect  ASSESSMENT AND PLAN: *UGIB due to Dieulafoy lesion and blood loss anemia:  Treated with epi injection and hemoclip x 2.  Received one unit PRBC's and feraheme x 1.  Hgb is trending up and patient feels well without sign of bleeding.  Plan is to continue pantoprazole 40 mg BID for a total of 6 weeks then decrease to once daily.  He is ok from GI standpoint to restart Pradaxa for atrial fib if deemed high risk and necessary.     CC:  Nche, Charlene Brooke, NP  CC:  Dr. Martinique

## 2018-03-25 NOTE — Patient Instructions (Addendum)
We have sent the following medications to your pharmacy for you to pick up at your convenience: Pantoprazole 40 mg twice a day, after September 7th decrease to once a day.   Ok to restart Pradaxa.

## 2018-03-25 NOTE — Progress Notes (Signed)
Thank you for sending this case to me. I have reviewed the entire note and agree with the outlined plan.   Wilfrid Lund, MD

## 2018-06-13 ENCOUNTER — Telehealth: Payer: Self-pay | Admitting: Cardiology

## 2018-06-13 NOTE — Telephone Encounter (Signed)
Spoke with patient and he needs Pradaxa Rx printed and faxed to patient assistance [phone] for another 90 days before program expires this year. Will forward to Malachy Mood so she can have Dr Martinique sign Rx when he returns.

## 2018-06-13 NOTE — Telephone Encounter (Signed)
Patient needs to speak with nurse, he did not want to go into detail with me. He said it was about his medication.

## 2018-06-23 ENCOUNTER — Ambulatory Visit: Payer: Medicare Other | Admitting: *Deleted

## 2018-06-24 ENCOUNTER — Other Ambulatory Visit: Payer: Self-pay

## 2018-06-24 MED ORDER — DABIGATRAN ETEXILATE MESYLATE 150 MG PO CAPS: 150.0000 mg | ORAL_CAPSULE | Freq: Two times a day (BID) | ORAL | 3 refills | Status: DC

## 2018-06-26 NOTE — Telephone Encounter (Signed)
Returned call to patient left message on personal voice mail Pradaxa prescription faxed to patient assistance at fax # [phone].

## 2018-08-18 ENCOUNTER — Telehealth: Payer: Self-pay | Admitting: Cardiology

## 2018-08-18 MED ORDER — DABIGATRAN ETEXILATE MESYLATE 150 MG PO CAPS: 150.0000 mg | ORAL_CAPSULE | Freq: Two times a day (BID) | ORAL | 3 refills | Status: DC

## 2018-08-18 NOTE — Telephone Encounter (Signed)
Returned call to patient.He stated he needs a 90 day Pradaxa prescription to send to patient assistance.Advised I will leave at front desk for pick up on 08/21/18.

## 2018-08-18 NOTE — Telephone Encounter (Signed)
New Message   Pt c/o medication issue:  1. Name of Medication: dabigatran (PRADAXA) 150 MG CAPS capsule   2. How are you currently taking this medication (dosage and times per day)?   3. Are you having a reaction (difficulty breathing--STAT)?   4. What is your medication issue? Patient is calling because he is needing assistance in enrolling in the patient assistance program for his medication. Please call to discuss.

## 2018-08-18 NOTE — Telephone Encounter (Signed)
Rqst fwd to [facility]

## 2018-10-07 ENCOUNTER — Encounter: Payer: Self-pay | Admitting: Gastroenterology

## 2019-01-12 ENCOUNTER — Telehealth: Payer: Self-pay | Admitting: Cardiology

## 2019-01-12 NOTE — Telephone Encounter (Signed)
Landline only, pre-reg complete, mychart active, verbal consent given 01/16/2019 MS

## 2019-01-13 ENCOUNTER — Other Ambulatory Visit: Payer: Self-pay | Admitting: Cardiology

## 2019-01-13 DIAGNOSIS — I482 Chronic atrial fibrillation, unspecified: Secondary | ICD-10-CM

## 2019-01-13 NOTE — Progress Notes (Signed)
Virtual Visit via Telephone Note   This visit type was conducted due to national recommendations for restrictions regarding the COVID-19 Pandemic (e.g. social distancing) in an effort to limit this patient's exposure and mitigate transmission in our community.  Due to his co-morbid illnesses, this patient is at least at moderate risk for complications without adequate follow up.  This format is felt to be most appropriate for this patient at this time.  The patient did not have access to video technology/had technical difficulties with video requiring transitioning to audio format only (telephone).  All issues noted in this document were discussed and addressed.  No physical exam could be performed with this format.  Please refer to the patient's chart for his  consent to telehealth for [facility].   Date:  01/16/2019   ID:  [name], DOB [birth year], MRN [id number]  Patient Location: Home Provider Location: Home  PCP:  Flossie Buffy, NP  Cardiologist:  Christinea Brizuela Martinique MD Electrophysiologist:  None   Evaluation Performed:  Follow-Up Visit  Chief Complaint:  Follow up AFib  History of Present Illness:    [name] is a 78 y.o. male with history of atrial fibrillation and HTN. He has a history of chronic atrial fibrillation. He failed medical therapy with Sotalol so he has been managed with rate control and anticoagulation with Pradaxa.   He did have an upper GI bleed in July 2019. Was transfused one unit PRBC. Upper EGD showed a Dieulafoy lesion in the stomach. His pradaxa was later resumed and he has no further bleeding.   He just turned 78. Feels well. No palpitations, dizziness, chest pain, SOB or edema.    The patient does not have symptoms concerning for COVID-19 infection (fever, chills, cough, or new shortness of breath).    Past Medical History:  Diagnosis Date  . Basal cell carcinoma   . Chronic atrial fibrillation ([place])   . Chronic dermatitis   . CKD  (chronic kidney disease), stage III ([place])    a. Probable based on historical Cr. H/o ARF. Previously saw Dr. Florene Glen.  . Erectile dysfunction   . GI bleed   . Hypertension   . Hypokalemia   . Meningioma (HCC)    Probable 13 x 12 mm meningioma in right frontal region  . Thrombocytopenia ([place])    improved   Past Surgical History:  Procedure Laterality Date  . ESOPHAGOGASTRODUODENOSCOPY (EGD) WITH PROPOFOL N/A 03/01/2018   Procedure: ESOPHAGOGASTRODUODENOSCOPY (EGD) WITH PROPOFOL;  Surgeon: Doran Stabler, MD;  Location: [facility];  Service: Gastroenterology;  Laterality: N/A;  . EYE SURGERY     spot removed from cornea  . SCHLEROTHERAPY  03/01/2018   Procedure: SCHLEROTHERAPY OF VARICES;  Surgeon: Doran Stabler, MD;  Location: [facility];  Service: Gastroenterology;;  epinephrine 1:10,000 injection / hemostasis clips  . TEE WITH CARDIOVERSION  12/14/2004   Successful TEE guided electrical cardioversion. -- showed mild mitral insufficiency and trace pulmonic insufficiency   . TOENAIL EXCISION     ingrown toenail removal  . TONSILLECTOMY       Current Meds  Medication Sig  . albuterol (PROAIR HFA) 108 (90 Base) MCG/ACT inhaler Inhale 1-2 puffs into the lungs every 6 (six) hours as needed for wheezing or shortness of breath.  . benazepril (LOTENSIN) 20 MG tablet Take 1 tablet (20 mg total) by mouth 2 (two) times daily.  . Cranberry 1000 MG CAPS Take 1,000 mg by mouth daily.  . dabigatran (  PRADAXA) 150 MG CAPS capsule Take 1 capsule (150 mg total) by mouth 2 (two) times daily.  Marland Kitchen diltiazem (DILT-XR) 240 MG 24 hr capsule Take 1 capsule (240 mg total) by mouth daily.  . furosemide (LASIX) 20 MG tablet Take 20 mg daily if needed for swelling  . Garlic 3664 MG CAPS Take 1 capsule by mouth daily.  . hydrALAZINE (APRESOLINE) 25 MG tablet Take 1 tablet (25 mg total) by mouth 3 (three) times daily.  . metoprolol tartrate (LOPRESSOR) 50 MG tablet Take 1 tablet by mouth twice daily  .  Multiple Vitamin (MULTIVITAMIN) tablet Take 1 tablet by mouth daily.  . pantoprazole (PROTONIX) 40 MG tablet Take 1 tablet (40 mg total) by mouth 2 (two) times daily before a meal. (Patient taking differently: Take 40 mg by mouth daily. )  . potassium chloride SA (KLOR-CON M20) 20 MEQ tablet Take 1 tablet (20 mEq total) by mouth daily.  . sildenafil (VIAGRA) 100 MG tablet Take 100 mg by mouth daily as needed for erectile dysfunction.   . tamsulosin (FLOMAX) 0.4 MG CAPS capsule Take 0.4 mg by mouth daily after supper.      Allergies:   Hctz [hydrochlorothiazide] and Cipro iv [ciprofloxacin]   Social History   Tobacco Use  . Smoking status: Former Smoker    Packs/day: 1.00    Years: 10.00    Pack years: 10.00    Types: Cigarettes    Quit date: 04/25/1970    Years since quitting: 48.7  . Smokeless tobacco: Never Used  Substance Use Topics  . Alcohol use: No  . Drug use: No     Family Hx: The patient's family history includes Asthma in his mother; Bladder Cancer in his mother; Diabetes in his brother, mother, and sister; Heart disease in his mother; Heart failure in his mother; Hypertension in his father and sister; Pancreatic cancer in his father.  ROS:   Please see the history of present illness.    All other systems reviewed and are negative.   Prior CV studies:   The following studies were reviewed today:  none  Labs/Other Tests and Data Reviewed:    EKG:  No ECG reviewed.  Recent Labs: 03/01/2018: ALT 11 03/13/2018: BUN 31; Creatinine, Ser 1.32; Hemoglobin 9.6; Platelets 197.0; Potassium 4.0; Sodium 140   Recent Lipid Panel Lab Results  Component Value Date/Time   CHOL 107 12/23/2017 11:18 AM   TRIG 104.0 12/23/2017 11:18 AM   HDL 41.40 12/23/2017 11:18 AM   CHOLHDL 3 12/23/2017 11:18 AM   LDLCALC 45 12/23/2017 11:18 AM    Wt Readings from Last 3 Encounters:  01/16/19 210 lb (95.3 kg)  03/25/18 218 lb 6 oz (99.1 kg)  03/13/18 222 lb (100.7 kg)     Objective:     Vital Signs:  BP 123/67   Pulse 69   Ht 6\' 1"  (1.854 m)   Wt 210 lb (95.3 kg)   BMI 27.71 kg/m    VITAL SIGNS:  reviewed  ASSESSMENT & PLAN:    1. Atrial fibrillation.  permanent. He is asymptomatic. Rate is well controlled. Continue Pradaxa.    2. Hypertension. Blood pressure is well controlled.   3. History of upper GI bleed without recurrence.   COVID-19 Education: The signs and symptoms of COVID-19 were discussed with the patient and how to seek care for testing (follow up with PCP or arrange E-visit).  The importance of social distancing was discussed today.  Time:   Today, I have  spent 10 minutes with the patient with telehealth technology discussing the above problems.     Medication Adjustments/Labs and Tests Ordered: Current medicines are reviewed at length with the patient today.  Concerns regarding medicines are outlined above.   Tests Ordered: No orders of the defined types were placed in this encounter.   Medication Changes: No orders of the defined types were placed in this encounter.   Disposition:  Follow up in 1 year(s)  Signed, Arzell Mcgeehan Martinique, MD  01/16/2019 10:34 AM    [facility]

## 2019-01-16 ENCOUNTER — Telehealth (INDEPENDENT_AMBULATORY_CARE_PROVIDER_SITE_OTHER): Payer: Medicare Other | Admitting: Cardiology

## 2019-01-16 VITALS — BP 123/67 | HR 69 | Ht 73.0 in | Wt 210.0 lb

## 2019-01-16 DIAGNOSIS — I482 Chronic atrial fibrillation, unspecified: Secondary | ICD-10-CM | POA: Diagnosis not present

## 2019-01-16 DIAGNOSIS — I10 Essential (primary) hypertension: Secondary | ICD-10-CM

## 2019-01-16 DIAGNOSIS — Z7901 Long term (current) use of anticoagulants: Secondary | ICD-10-CM

## 2019-01-16 NOTE — Patient Instructions (Signed)
Medication Instructions:  Continue same medications If you need a refill on your cardiac medications before your next appointment, please call your pharmacy.   Lab work: None ordered  Testing/Procedures: None ordered  Follow-Up: At [facility], you and your health needs are our priority.  As part of our continuing mission to provide you with exceptional heart care, we have created designated Provider Care Teams.  These Care Teams include your primary Cardiologist (physician) and Advanced Practice Providers (APPs -  Physician Assistants and Nurse Practitioners) who all work together to provide you with the care you need, when you need it. . Schedule follow up appointment in 1 year    Call in 3 months to schedule

## 2019-01-29 ENCOUNTER — Telehealth: Payer: Self-pay | Admitting: Nurse Practitioner

## 2019-01-29 ENCOUNTER — Other Ambulatory Visit: Payer: Self-pay | Admitting: Cardiology

## 2019-01-29 NOTE — Telephone Encounter (Signed)
Questions for Screening COVID-19  Symptom onset: n/a  Travel or Contacts: no  During this illness, did/does the patient experience any of the following symptoms? Fever >100.13F []   Yes [x]   No []   Unknown Subjective fever (felt feverish) []   Yes [x]   No []   Unknown Chills []   Yes [x]   No []   Unknown Muscle aches (myalgia) []   Yes [x]   No []   Unknown Runny nose (rhinorrhea) []   Yes [x]   No []   Unknown Sore throat []   Yes [x]   No []   Unknown Cough (new onset or worsening of chronic cough) []   Yes [x]   No []   Unknown Shortness of breath (dyspnea) []   Yes [x]   No []   Unknown Nausea or vomiting []   Yes [x]   No []   Unknown Headache []   Yes [x]   No []   Unknown Abdominal pain  []   Yes [x]   No []   Unknown Diarrhea (?3 loose/looser than normal stools/24hr period) []   Yes []   No []   Unknown Other, specify:  Patient risk factors: Smoker? []   Current []   Former []   Never If male, currently pregnant? []   Yes []   No  Patient Active Problem List   Diagnosis Date Noted  . Acute blood loss anemia 03/01/2018  . Gastric hemorrhage due to Dieulafoy lesion of stomach   . Thrombocytopenia ([place]) 02/28/2018  . Acute GI bleeding 02/28/2018  . Elevated PSA 06/21/2017  . Lower extremity edema 09/24/2014  . Hypokalemia   . Hypomagnesemia   . Near syncope 08/27/2014  . CKD (chronic kidney disease), stage III ([place]) 08/27/2014  . Symptomatic anemia 08/27/2014  . Chronic anticoagulation 01/18/2014  . Preventative health care 11/08/2011  . Prostate cancer screening 11/06/2010  . Squamous cell carcinoma 08/09/2008  . ERECTILE DYSFUNCTION 06/25/2008  . Essential hypertension 06/25/2008  . Chronic atrial fibrillation ([place]) 06/25/2008    Plan:  []   High risk for COVID-19 with red flags go to ED (with CP, SOB, weak/lightheaded, or fever > 101.5). Call ahead.  []   High risk for COVID-19 but stable. Inform provider and coordinate time for [facility] visit.   []   No red flags but URI signs or symptoms okay for  [facility] visit.

## 2019-01-30 ENCOUNTER — Other Ambulatory Visit: Payer: Self-pay

## 2019-01-30 ENCOUNTER — Ambulatory Visit (INDEPENDENT_AMBULATORY_CARE_PROVIDER_SITE_OTHER): Payer: Medicare Other | Admitting: Nurse Practitioner

## 2019-01-30 ENCOUNTER — Encounter: Payer: Self-pay | Admitting: Nurse Practitioner

## 2019-01-30 VITALS — BP 120/76 | HR 66 | Temp 97.9°F | Ht 73.0 in | Wt 216.6 lb

## 2019-01-30 DIAGNOSIS — I10 Essential (primary) hypertension: Secondary | ICD-10-CM

## 2019-01-30 DIAGNOSIS — E782 Mixed hyperlipidemia: Secondary | ICD-10-CM | POA: Diagnosis not present

## 2019-01-30 DIAGNOSIS — N183 Chronic kidney disease, stage 3 unspecified: Secondary | ICD-10-CM

## 2019-01-30 DIAGNOSIS — D696 Thrombocytopenia, unspecified: Secondary | ICD-10-CM | POA: Diagnosis not present

## 2019-01-30 DIAGNOSIS — Z7901 Long term (current) use of anticoagulants: Secondary | ICD-10-CM

## 2019-01-30 NOTE — Progress Notes (Signed)
Subjective:  Patient ID: [name], male    DOB: [birth year]  Age: 78 y.o. MRN: [id number]  CC: Follow-up (HTN, Hyperlipidemia)  HPI  HTN: Controlled with benzapril,  hydralazine and furosemide Managed by Dr. Martinique BP Readings from Last 3 Encounters:  01/30/19 120/76  01/16/19 123/67  03/25/18 134/86   A-fib: Rate controlled with metoprolol and diltiazem. Anticoagulation with pradaxa. Protonix used due to previous GI bleed. Echo last done 2014  CKD-stage 3: Stable with creatinine at 1.3 Stale electrolytes, no edema Managed by Dr.Jordan  Hyperlipidemia: LDL at goal with diet. No DM, no tobacco use No known CAD.  Reviewed past Medical, Social and Family history today.  Outpatient Medications Prior to Visit  Medication Sig Dispense Refill  . albuterol (PROAIR HFA) 108 (90 Base) MCG/ACT inhaler Inhale 1-2 puffs into the lungs every 6 (six) hours as needed for wheezing or shortness of breath. 1 Inhaler 0  . benazepril (LOTENSIN) 20 MG tablet Take 1 tablet (20 mg total) by mouth 2 (two) times daily. 180 tablet 3  . Cranberry 1000 MG CAPS Take 1,000 mg by mouth daily.    . dabigatran (PRADAXA) 150 MG CAPS capsule Take 1 capsule (150 mg total) by mouth 2 (two) times daily. 180 capsule 3  . diltiazem (DILT-XR) 240 MG 24 hr capsule Take 1 capsule (240 mg total) by mouth daily. 90 capsule 3  . furosemide (LASIX) 20 MG tablet Take 20 mg daily if needed for swelling 90 tablet 3  . Garlic 7062 MG CAPS Take 1 capsule by mouth daily.    . hydrALAZINE (APRESOLINE) 25 MG tablet Take 1 tablet (25 mg total) by mouth 3 (three) times daily. 270 tablet 3  . metoprolol tartrate (LOPRESSOR) 50 MG tablet Take 1 tablet by mouth twice daily 180 tablet 0  . Multiple Vitamin (MULTIVITAMIN) tablet Take 1 tablet by mouth daily.    . pantoprazole (PROTONIX) 40 MG tablet Take 1 tablet (40 mg total) by mouth 2 (two) times daily before a meal. (Patient taking differently: Take 40 mg by mouth daily. )  60 tablet 2  . potassium chloride SA (K-DUR) 20 MEQ tablet Take 1 tablet by mouth once daily 90 tablet 3  . sildenafil (VIAGRA) 100 MG tablet Take 100 mg by mouth daily as needed for erectile dysfunction.     . tamsulosin (FLOMAX) 0.4 MG CAPS capsule Take 0.4 mg by mouth daily after supper.   0   No facility-administered medications prior to visit.     ROS Review of Systems  Constitutional: Negative.   Respiratory: Negative.   Cardiovascular: Negative.   Neurological: Negative.   Psychiatric/Behavioral: Negative.     Objective:  BP 120/76   Pulse 66   Temp 97.9 F (36.6 C) (Oral)   Ht 6\' 1"  (1.854 m)   Wt 216 lb 9.6 oz (98.2 kg)   SpO2 95%   BMI 28.58 kg/m   BP Readings from Last 3 Encounters:  01/30/19 120/76  01/16/19 123/67  03/25/18 134/86    Wt Readings from Last 3 Encounters:  01/30/19 216 lb 9.6 oz (98.2 kg)  01/16/19 210 lb (95.3 kg)  03/25/18 218 lb 6 oz (99.1 kg)    Physical Exam Vitals signs reviewed.  Constitutional:      Appearance: He is not ill-appearing.  HENT:     Right Ear: Tympanic membrane, ear canal and external ear normal.     Left Ear: Tympanic membrane, ear canal and external ear normal.  Eyes:  Extraocular Movements: Extraocular movements intact.     Conjunctiva/sclera: Conjunctivae normal.  Neck:     Musculoskeletal: Normal range of motion and neck supple.  Cardiovascular:     Rate and Rhythm: Normal rate and regular rhythm.     Pulses: Normal pulses.     Heart sounds: Normal heart sounds.  Pulmonary:     Effort: Pulmonary effort is normal.     Breath sounds: Normal breath sounds.  Abdominal:     General: Bowel sounds are normal.     Palpations: Abdomen is soft.  Musculoskeletal:     Right lower leg: No edema.     Left lower leg: No edema.  Skin:    Findings: No erythema or rash.  Neurological:     Mental Status: He is alert and oriented to person, place, and time.  Psychiatric:        Mood and Affect: Mood normal.         Behavior: Behavior normal.        Thought Content: Thought content normal.        Judgment: Judgment normal.     Lab Results  Component Value Date   WBC 5.3 03/13/2018   HGB 9.6 (L) 03/13/2018   HCT 28.6 (L) 03/13/2018   PLT 197.0 03/13/2018   GLUCOSE 99 03/13/2018   CHOL 107 12/23/2017   TRIG 104.0 12/23/2017   HDL 41.40 12/23/2017   LDLCALC 45 12/23/2017   ALT 11 03/01/2018   AST 15 03/01/2018   NA 140 03/13/2018   K 4.0 03/13/2018   CL 108 03/13/2018   CREATININE 1.32 03/13/2018   BUN 31 (H) 03/13/2018   CO2 24 03/13/2018   TSH 1.63 05/20/2015   PSA 3.88 12/23/2017   INR 1.55 03/01/2018    Assessment & Plan:   Kamdin was seen today for follow-up.  Diagnoses and all orders for this visit:  Essential hypertension -     Cancel: TSH -     Cancel: Comprehensive metabolic panel -     Comprehensive metabolic panel -     TSH  CKD (chronic kidney disease), stage III (HCC) -     Cancel: Comprehensive metabolic panel -     Comprehensive metabolic panel  Thrombocytopenia (HCC) -     Cancel: CBC w/Diff -     CBC w/Diff  Mixed hyperlipidemia -     Cancel: Lipid panel -     Lipid panel   I am having Sadrac R. Ethington maintain his sildenafil, multivitamin, Garlic, tamsulosin, albuterol, benazepril, diltiazem, furosemide, hydrALAZINE, Cranberry, pantoprazole, dabigatran, metoprolol tartrate, and potassium chloride SA.  No orders of the defined types were placed in this encounter.   Problem List Items Addressed This Visit      Cardiovascular and Mediastinum   Essential hypertension - Primary   Relevant Orders   Comprehensive metabolic panel   TSH     Genitourinary   CKD (chronic kidney disease), stage III ([place])   Relevant Orders   Comprehensive metabolic panel     Other   Thrombocytopenia ([place])   Relevant Orders   CBC w/Diff    Other Visit Diagnoses    Mixed hyperlipidemia       Relevant Orders   Lipid panel       Follow-up: Return in about 6  months (around 08/01/2019) for HTN and, hyperlipidemia (fasting).  Wilfred Lacy, NP

## 2019-01-30 NOTE — Patient Instructions (Signed)
Go to lab for blood draw. You will be contacted with lab result.

## 2019-01-31 LAB — LIPID PANEL
Cholesterol: 111 mg/dL (ref <200)
HDL: 43 mg/dL (ref >=40)
LDL Cholesterol (Calc): 52 mg/dL (calc)
Non-HDL Cholesterol (Calc): 68 mg/dL (calc) (ref <130)
Total CHOL/HDL Ratio: 2.6 (calc) (ref <5.0)
Triglycerides: 80 mg/dL (ref <150)

## 2019-01-31 LAB — CBC WITH DIFFERENTIAL/PLATELET
Absolute Monocytes: 467 cells/uL (ref 200–950)
Basophils Absolute: 40 cells/uL (ref 0–200)
Basophils Relative: 0.7 %
Eosinophils Absolute: 108 cells/uL (ref 15–500)
Eosinophils Relative: 1.9 %
HCT: 42.6 % (ref 38.5–50.0)
Hemoglobin: 14.3 g/dL (ref 13.2–17.1)
Lymphs Abs: 1419 cells/uL (ref 850–3900)
MCH: 30.8 pg (ref 27.0–33.0)
MCHC: 33.6 g/dL (ref 32.0–36.0)
MCV: 91.6 fL (ref 80.0–100.0)
MPV: 12.2 fL (ref 7.5–12.5)
Monocytes Relative: 8.2 %
Neutro Abs: 3665 cells/uL (ref 1500–7800)
Neutrophils Relative %: 64.3 %
Platelets: 164 10*3/uL (ref 140–400)
RBC: 4.65 10*6/uL (ref 4.20–5.80)
RDW: 12.7 % (ref 11.0–15.0)
Total Lymphocyte: 24.9 %
WBC: 5.7 10*3/uL (ref 3.8–10.8)

## 2019-01-31 LAB — COMPREHENSIVE METABOLIC PANEL
AG Ratio: 2 (calc) (ref 1.0–2.5)
ALT: 14 U/L (ref 9–46)
AST: 19 U/L (ref 10–35)
Albumin: 4.6 g/dL (ref 3.6–5.1)
Alkaline phosphatase (APISO): 79 U/L (ref 35–144)
BUN/Creatinine Ratio: 21 (calc) (ref 6–22)
BUN: 27 mg/dL — ABNORMAL HIGH (ref 7–25)
CO2: 26 mmol/L (ref 20–32)
Calcium: 9.7 mg/dL (ref 8.6–10.3)
Chloride: 107 mmol/L (ref 98–110)
Creat: 1.3 mg/dL — ABNORMAL HIGH (ref 0.70–1.18)
Globulin: 2.3 g/dL (calc) (ref 1.9–3.7)
Glucose, Bld: 96 mg/dL (ref 65–99)
Potassium: 4.7 mmol/L (ref 3.5–5.3)
Sodium: 142 mmol/L (ref 135–146)
Total Bilirubin: 1 mg/dL (ref 0.2–1.2)
Total Protein: 6.9 g/dL (ref 6.1–8.1)

## 2019-01-31 LAB — TSH: TSH: 2.31 mIU/L (ref 0.40–4.50)

## 2019-02-02 ENCOUNTER — Encounter: Payer: Self-pay | Admitting: Nurse Practitioner

## 2019-02-02 ENCOUNTER — Other Ambulatory Visit: Payer: Self-pay | Admitting: Cardiology

## 2019-02-02 ENCOUNTER — Ambulatory Visit: Payer: Medicare Other | Admitting: Nurse Practitioner

## 2019-02-02 MED ORDER — PANTOPRAZOLE SODIUM 20 MG PO TBEC: 20.0000 mg | DELAYED_RELEASE_TABLET | Freq: Every day | ORAL | 1 refills | Status: DC

## 2019-02-20 ENCOUNTER — Other Ambulatory Visit: Payer: Self-pay | Admitting: Cardiology

## 2019-02-20 DIAGNOSIS — I10 Essential (primary) hypertension: Secondary | ICD-10-CM

## 2019-02-26 ENCOUNTER — Ambulatory Visit: Payer: Medicare Other | Admitting: Nurse Practitioner

## 2019-03-26 ENCOUNTER — Telehealth: Payer: Self-pay | Admitting: Nurse Practitioner

## 2019-03-26 NOTE — Telephone Encounter (Signed)
I called and left message on voicemail  To cal office and schedule follow up appointment with [facility].

## 2019-04-14 ENCOUNTER — Other Ambulatory Visit: Payer: Self-pay | Admitting: Cardiology

## 2019-04-14 DIAGNOSIS — I482 Chronic atrial fibrillation, unspecified: Secondary | ICD-10-CM

## 2019-06-09 ENCOUNTER — Other Ambulatory Visit: Payer: Self-pay

## 2019-06-09 MED ORDER — BENAZEPRIL HCL 20 MG PO TABS: 20.0000 mg | ORAL_TABLET | Freq: Two times a day (BID) | ORAL | 2 refills | Status: DC

## 2019-06-09 NOTE — Telephone Encounter (Signed)
Pt would like 90 day supply of his medication.

## 2019-06-24 ENCOUNTER — Other Ambulatory Visit: Payer: Self-pay

## 2019-06-24 MED ORDER — BENAZEPRIL HCL 20 MG PO TABS: 20.0000 mg | ORAL_TABLET | Freq: Two times a day (BID) | ORAL | 2 refills | Status: DC

## 2019-08-04 ENCOUNTER — Telehealth: Payer: Self-pay | Admitting: Cardiology

## 2019-08-04 NOTE — Telephone Encounter (Signed)
Patient needs to speak to nurse. He said it's about some paperwork.

## 2019-08-04 NOTE — Telephone Encounter (Signed)
Returned call to patient of Dr. Martinique. He reports he has been getting assistance for Pradaxa and he will be bringing the paperwork by the office tomorrow. He would like to pick up the paperwork and will submit himself.

## 2019-08-13 NOTE — Telephone Encounter (Signed)
Called patient [name].

## 2019-08-13 NOTE — Telephone Encounter (Signed)
Spoke to patient Dr.Jordan received patient assistance form for Pradaxa you mailed him.Stated he will bring his portion of paper work to office today.I will fax all forms together.

## 2019-08-17 ENCOUNTER — Telehealth: Payer: Self-pay | Admitting: Cardiology

## 2019-08-17 NOTE — Telephone Encounter (Signed)
We are recommending the COVID-19 vaccine to all of our patients. Cardiac medications (including blood thinners) should not deter anyone from being vaccinated and there is no need to hold any of those medications prior to vaccine administration.     Currently, there is a hotline to call (active 08/14/19) to schedule vaccination appointments as no walk-ins will be accepted.   Number: [phone]    If you have further questions or concerns about the vaccine process, please visit [email or web address] or contact your primary care physician.  Patient verbalized understanding.  

## 2019-08-26 NOTE — Telephone Encounter (Signed)
Patient assistance form for Pradaxa completed and faxed to [employer] at fax # [phone] o 08/13/19.

## 2019-09-21 ENCOUNTER — Other Ambulatory Visit: Payer: Self-pay | Admitting: Nurse Practitioner

## 2019-09-21 DIAGNOSIS — Z7901 Long term (current) use of anticoagulants: Secondary | ICD-10-CM

## 2019-11-17 NOTE — Progress Notes (Signed)
[name] Date of Birth: 1941/06/29   History of Present Illness: [name] is seen today for followup of  atrial fibrillation and HTN. He has a history of chronic atrial fibrillation. He failed medical therapy with Sotalol so he has been managed with rate control and anticoagulation with Pradaxa.   He did have an upper GI bleed in July 2019. Was transfused one unit PRBC. Upper EGD showed a Dieulafoy lesion in the stomach. His pradaxa was later resumed and he has no further bleeding.   On follow up today he feels great. He denies any chest pain, SOB, palpitations, dizziness, or edema. Energy level is good. He stays active. No bleeding problems.   Current Outpatient Medications on File Prior to Visit  Medication Sig Dispense Refill  . albuterol (PROAIR HFA) 108 (90 Base) MCG/ACT inhaler Inhale 1-2 puffs into the lungs every 6 (six) hours as needed for wheezing or shortness of breath. 1 Inhaler 0  . benazepril (LOTENSIN) 20 MG tablet Take 1 tablet (20 mg total) by mouth 2 (two) times daily. 180 tablet 2  . Cranberry 1000 MG CAPS Take 1,000 mg by mouth daily.    . dabigatran (PRADAXA) 150 MG CAPS capsule Take 1 capsule (150 mg total) by mouth 2 (two) times daily. 180 capsule 3  . DILT-XR 240 MG 24 hr capsule Take 1 capsule by mouth once daily 90 capsule 3  . furosemide (LASIX) 20 MG tablet Take 20 mg daily if needed for swelling 90 tablet 3  . Garlic 123XX123 MG CAPS Take 1 capsule by mouth daily.    . hydrALAZINE (APRESOLINE) 25 MG tablet TAKE 1 TABLET BY MOUTH THREE TIMES DAILY 270 tablet 3  . metoprolol tartrate (LOPRESSOR) 50 MG tablet Take 1 tablet by mouth twice daily 180 tablet 2  . Multiple Vitamin (MULTIVITAMIN) tablet Take 1 tablet by mouth daily.    . pantoprazole (PROTONIX) 20 MG tablet Take 1 tablet by mouth once daily 90 tablet 0  . potassium chloride SA (K-DUR) 20 MEQ tablet Take 1 tablet by mouth once daily 90 tablet 3  . sildenafil (VIAGRA) 100 MG tablet Take 100 mg by mouth  daily as needed for erectile dysfunction.     . tamsulosin (FLOMAX) 0.4 MG CAPS capsule Take 0.4 mg by mouth daily after supper.   0   No current facility-administered medications on file prior to visit.    Allergies  Allergen Reactions  . Hctz [Hydrochlorothiazide] Photosensitivity  . Cipro Iv [Ciprofloxacin] Hives and Rash    Caused red streaks up the arm    Past Medical History:  Diagnosis Date  . Basal cell carcinoma   . Chronic atrial fibrillation ([place])   . Chronic dermatitis   . CKD (chronic kidney disease), stage III    a. Probable based on historical Cr. H/o ARF. Previously saw Dr. Florene Glen.  . Erectile dysfunction   . GI bleed   . Hypertension   . Hypokalemia   . Meningioma (HCC)    Probable 13 x 12 mm meningioma in right frontal region  . Thrombocytopenia ([place])    improved    Past Surgical History:  Procedure Laterality Date  . ESOPHAGOGASTRODUODENOSCOPY (EGD) WITH PROPOFOL N/A 03/01/2018   Procedure: ESOPHAGOGASTRODUODENOSCOPY (EGD) WITH PROPOFOL;  Surgeon: Doran Stabler, MD;  Location: [place];  Service: Gastroenterology;  Laterality: N/A;  . EYE SURGERY     spot removed from cornea  . SCHLEROTHERAPY  03/01/2018   Procedure: SCHLEROTHERAPY OF VARICES;  Surgeon: Doran Stabler, MD;  Location: [place];  Service: Gastroenterology;;  epinephrine 1:10,000 injection / hemostasis clips  . TEE WITH CARDIOVERSION  12/14/2004   Successful TEE guided electrical cardioversion. -- showed mild mitral insufficiency and trace pulmonic insufficiency   . TOENAIL EXCISION     ingrown toenail removal  . TONSILLECTOMY      Social History   Tobacco Use  Smoking Status Former Smoker  . Packs/day: 1.00  . Years: 10.00  . Pack years: 10.00  . Types: Cigarettes  . Quit date: 04/25/1970  . Years since quitting: 49.6  Smokeless Tobacco Never Used    Social History   Substance and Sexual Activity  Alcohol Use No    Family History  Problem Relation Age of  Onset  . Heart disease Mother        with pacemaker  . Diabetes Mother   . Asthma Mother   . Heart failure Mother   . Bladder Cancer Mother   . Hypertension Father   . Pancreatic cancer Father   . Diabetes Brother   . Hypertension Sister   . Diabetes Sister     Review of Systems: As noted in history of present illness.  All other systems were reviewed and are negative.  Physical Exam: BP 138/84   Pulse 66   Ht 6\' 2"  (1.88 m)   Wt 208 lb (94.3 kg)   SpO2 95%   BMI 26.71 kg/m  GENERAL:  Well appearing HEENT:  PERRL, EOMI, sclera are clear. Oropharynx is clear. NECK:  No jugular venous distention, carotid upstroke brisk and symmetric, no bruits, no thyromegaly or adenopathy LUNGS:  Clear to auscultation bilaterally CHEST:  Unremarkable HEART:  IRRR,  PMI not displaced or sustained,S1 and S2 within normal limits, no S3, no S4: no clicks, no rubs, no murmurs ABD:  Soft, nontender. BS +, no masses or bruits. No hepatomegaly, no splenomegaly EXT:  2 + pulses throughout, no edema, no cyanosis no clubbing SKIN:  Warm and dry.  No rashes NEURO:  Alert and oriented x 3. Cranial nerves II through XII intact. PSYCH:  Cognitively intact    LABORATORY DATA: Lab Results  Component Value Date   WBC 5.7 01/30/2019   HGB 14.3 01/30/2019   HCT 42.6 01/30/2019   PLT 164 01/30/2019   GLUCOSE 96 01/30/2019   CHOL 111 01/30/2019   TRIG 80 01/30/2019   HDL 43 01/30/2019   LDLCALC 52 01/30/2019   ALT 14 01/30/2019   AST 19 01/30/2019   NA 142 01/30/2019   K 4.7 01/30/2019   CL 107 01/30/2019   CREATININE 1.30 (H) 01/30/2019   BUN 27 (H) 01/30/2019   CO2 26 01/30/2019   TSH 2.31 01/30/2019   PSA 3.88 12/23/2017   INR 1.55 03/01/2018    Ecg today shows AFib with rate 66. Mild nonspecific ST abnormality. I have personally reviewed and interpreted this study.    Assessment / Plan: 1. Atrial fibrillation.  permanent. He is asymptomatic. Rate is well controlled. Continue Pradaxa.   We will continue a long-term strategy of rate control and anticoagulation.   2. Hypertension. Blood pressure is well controlled.   Will follow up lab work today  I will follow up in 1 year.

## 2019-11-19 ENCOUNTER — Other Ambulatory Visit: Payer: Self-pay

## 2019-11-19 ENCOUNTER — Ambulatory Visit: Payer: Medicare Other | Admitting: Cardiology

## 2019-11-19 ENCOUNTER — Encounter: Payer: Self-pay | Admitting: Cardiology

## 2019-11-19 VITALS — BP 138/84 | HR 66 | Ht 74.0 in | Wt 208.0 lb

## 2019-11-19 DIAGNOSIS — I10 Essential (primary) hypertension: Secondary | ICD-10-CM | POA: Diagnosis not present

## 2019-11-19 DIAGNOSIS — I482 Chronic atrial fibrillation, unspecified: Secondary | ICD-10-CM

## 2019-11-19 DIAGNOSIS — Z7901 Long term (current) use of anticoagulants: Secondary | ICD-10-CM | POA: Diagnosis not present

## 2019-11-19 NOTE — Addendum Note (Signed)
Addended by: Kathyrn Lass on: 11/19/2019 03:21 PM   Modules accepted: Orders

## 2019-11-20 LAB — CBC WITH DIFFERENTIAL/PLATELET
Basophils Absolute: 0 10*3/uL (ref 0.0–0.2)
Basos: 1 %
EOS (ABSOLUTE): 0 10*3/uL (ref 0.0–0.4)
Eos: 1 %
Hematocrit: 41.7 % (ref 37.5–51.0)
Hemoglobin: 14.2 g/dL (ref 13.0–17.7)
Immature Grans (Abs): 0 10*3/uL (ref 0.0–0.1)
Immature Granulocytes: 0 %
Lymphocytes Absolute: 0.9 10*3/uL (ref 0.7–3.1)
Lymphs: 14 %
MCH: 31.3 pg (ref 26.6–33.0)
MCHC: 34.1 g/dL (ref 31.5–35.7)
MCV: 92 fL (ref 79–97)
Monocytes Absolute: 0.5 10*3/uL (ref 0.1–0.9)
Monocytes: 7 %
Neutrophils Absolute: 5 10*3/uL (ref 1.4–7.0)
Neutrophils: 77 %
Platelets: 160 10*3/uL (ref 150–450)
RBC: 4.54 x10E6/uL (ref 4.14–5.80)
RDW: 13.1 % (ref 11.6–15.4)
WBC: 6.4 10*3/uL (ref 3.4–10.8)

## 2019-11-20 LAB — TSH: TSH: 1.4 u[IU]/mL (ref 0.450–4.500)

## 2019-11-20 LAB — LIPID PANEL
Chol/HDL Ratio: 2.4 ratio (ref 0.0–5.0)
Cholesterol, Total: 109 mg/dL (ref 100–199)
HDL: 46 mg/dL (ref >39)
LDL Chol Calc (NIH): 44 mg/dL (ref 0–99)
Triglycerides: 99 mg/dL (ref 0–149)
VLDL Cholesterol Cal: 19 mg/dL (ref 5–40)

## 2019-12-13 ENCOUNTER — Other Ambulatory Visit: Payer: Self-pay | Admitting: Nurse Practitioner

## 2019-12-13 DIAGNOSIS — Z7901 Long term (current) use of anticoagulants: Secondary | ICD-10-CM

## 2020-01-08 ENCOUNTER — Other Ambulatory Visit: Payer: Self-pay | Admitting: Cardiology

## 2020-01-08 DIAGNOSIS — I482 Chronic atrial fibrillation, unspecified: Secondary | ICD-10-CM

## 2020-02-01 ENCOUNTER — Other Ambulatory Visit: Payer: Self-pay | Admitting: Cardiovascular Disease

## 2020-02-04 ENCOUNTER — Other Ambulatory Visit: Payer: Self-pay | Admitting: Cardiology

## 2020-02-07 ENCOUNTER — Other Ambulatory Visit: Payer: Self-pay | Admitting: Cardiology

## 2020-02-07 DIAGNOSIS — I10 Essential (primary) hypertension: Secondary | ICD-10-CM

## 2020-03-14 ENCOUNTER — Other Ambulatory Visit: Payer: Self-pay | Admitting: Nurse Practitioner

## 2020-03-14 DIAGNOSIS — Z7901 Long term (current) use of anticoagulants: Secondary | ICD-10-CM

## 2020-03-17 ENCOUNTER — Other Ambulatory Visit: Payer: Self-pay | Admitting: Nurse Practitioner

## 2020-03-17 DIAGNOSIS — Z7901 Long term (current) use of anticoagulants: Secondary | ICD-10-CM

## 2020-04-21 ENCOUNTER — Other Ambulatory Visit: Payer: Self-pay

## 2020-04-22 ENCOUNTER — Encounter: Payer: Self-pay | Admitting: Nurse Practitioner

## 2020-04-22 ENCOUNTER — Ambulatory Visit (INDEPENDENT_AMBULATORY_CARE_PROVIDER_SITE_OTHER): Payer: Medicare Other | Admitting: Nurse Practitioner

## 2020-04-22 VITALS — BP 128/84 | HR 78 | Temp 97.1°F | Ht 74.0 in | Wt 205.8 lb

## 2020-04-22 DIAGNOSIS — D696 Thrombocytopenia, unspecified: Secondary | ICD-10-CM

## 2020-04-22 DIAGNOSIS — R972 Elevated prostate specific antigen [PSA]: Secondary | ICD-10-CM | POA: Diagnosis not present

## 2020-04-22 DIAGNOSIS — N1831 Chronic kidney disease, stage 3a: Secondary | ICD-10-CM

## 2020-04-22 DIAGNOSIS — I10 Essential (primary) hypertension: Secondary | ICD-10-CM

## 2020-04-22 DIAGNOSIS — G250 Essential tremor: Secondary | ICD-10-CM

## 2020-04-22 NOTE — Assessment & Plan Note (Signed)
Stable renal function Repeat CMP today

## 2020-04-22 NOTE — Progress Notes (Signed)
Subjective:  Patient ID: [name], male    DOB: 07-08-1941  Age: 79 y.o. MRN: [id number]  CC: Follow-up (Pt states he is here just for a routine follow up/No refills needed. )  HPI  Essential tremor FHx of essential tremor-mother. Mild tremor of chin, left hand with kinetic tremor Normal speech and voice tone. No cogwheel rigidity noted, no resting or postural tremor denies any change in gait, or handwriting or stiffness or voice. Denies any falls.  Declined referral to neurology because it does not interfere with ADLs at this time.  Elevated PSA No nocturia or urinary frequency or incontinence or urgency or hematuria Repeat PSA today  Essential hypertension BP at goal with hydralazine, and metoprolol Under care of Dr. Martinique (cardiology) Wt Readings from Last 3 Encounters:  04/22/20 205 lb 12.8 oz (93.4 kg)  11/19/19 208 lb (94.3 kg)  01/30/19 216 lb 9.6 oz (98.2 kg)   Maintain current medications Repeat CMP  CKD (chronic kidney disease), stage III Stable renal function Repeat CMP today  Wt Readings from Last 3 Encounters:  04/22/20 205 lb 12.8 oz (93.4 kg)  11/19/19 208 lb (94.3 kg)  01/30/19 216 lb 9.6 oz (98.2 kg)   Reviewed past Medical, Social and Family history today.  Outpatient Medications Prior to Visit  Medication Sig Dispense Refill  . Acetaminophen (TYLENOL ARTHRITIS PAIN PO) Take by mouth.    . benazepril (LOTENSIN) 20 MG tablet Take 1 tablet (20 mg total) by mouth 2 (two) times daily. 180 tablet 2  . Cranberry 1000 MG CAPS Take 1,000 mg by mouth daily.    . dabigatran (PRADAXA) 150 MG CAPS capsule Take 1 capsule (150 mg total) by mouth 2 (two) times daily. 180 capsule 3  . DILT-XR 240 MG 24 hr capsule Take 1 capsule by mouth once daily 90 capsule 3  . furosemide (LASIX) 20 MG tablet Take 20 mg daily if needed for swelling 90 tablet 3  . Garlic 9935 MG CAPS Take 1 capsule by mouth daily.    . hydrALAZINE (APRESOLINE) 25 MG tablet TAKE 1 TABLET  BY MOUTH THREE TIMES DAILY 270 tablet 3  . metoprolol tartrate (LOPRESSOR) 50 MG tablet Take 1 tablet by mouth twice daily 180 tablet 3  . Multiple Vitamin (MULTIVITAMIN) tablet Take 1 tablet by mouth daily.    . pantoprazole (PROTONIX) 20 MG tablet Take 1 tablet by mouth once daily 90 tablet 0  . potassium chloride SA (KLOR-CON) 20 MEQ tablet TAKE 1  BY MOUTH ONCE DAILY 90 tablet 2  . sildenafil (VIAGRA) 100 MG tablet Take 100 mg by mouth daily as needed for erectile dysfunction.     . tamsulosin (FLOMAX) 0.4 MG CAPS capsule Take 0.4 mg by mouth daily after supper.   0  . albuterol (PROAIR HFA) 108 (90 Base) MCG/ACT inhaler Inhale 1-2 puffs into the lungs every 6 (six) hours as needed for wheezing or shortness of breath. (Patient not taking: Reported on 04/22/2020) 1 Inhaler 0   No facility-administered medications prior to visit.    ROS Review of Systems  Constitutional: Negative for fever, malaise/fatigue and weight loss.  HENT: Negative for congestion, hearing loss, sore throat and tinnitus.   Eyes:       Negative for visual changes  Respiratory: Negative for cough and shortness of breath.   Cardiovascular: Negative for chest pain, palpitations and leg swelling.  Gastrointestinal: Negative for blood in stool, constipation, diarrhea, heartburn and melena.  Genitourinary: Negative for dysuria, frequency  and urgency.  Musculoskeletal: Negative for falls, joint pain and myalgias.  Skin: Negative for rash.  Neurological: Positive for tremors. Negative for dizziness, tingling, sensory change, focal weakness, weakness and headaches.  Endo/Heme/Allergies: Does not bruise/bleed easily.  Psychiatric/Behavioral: Negative for depression, substance abuse and suicidal ideas. The patient is not nervous/anxious and does not have insomnia.     Objective:  BP 128/84 (BP Location: Left Arm, Patient Position: Sitting, Cuff Size: Normal)   Pulse 78   Temp (!) 97.1 F (36.2 C) (Temporal)   Ht 6\' 2"   (1.88 m)   Wt 205 lb 12.8 oz (93.4 kg)   SpO2 98%   BMI 26.42 kg/m   Physical Exam Vitals reviewed.  HENT:     Right Ear: Tympanic membrane, ear canal and external ear normal.     Left Ear: Tympanic membrane, ear canal and external ear normal.  Eyes:     Extraocular Movements: Extraocular movements intact.     Conjunctiva/sclera: Conjunctivae normal.  Cardiovascular:     Rate and Rhythm: Normal rate and regular rhythm.     Pulses: Normal pulses.     Heart sounds: Normal heart sounds.  Pulmonary:     Effort: Pulmonary effort is normal.     Breath sounds: Normal breath sounds.  Abdominal:     General: Bowel sounds are normal.     Palpations: Abdomen is soft.  Musculoskeletal:     Cervical back: Normal range of motion and neck supple.  Skin:    Findings: Lesion present.       Neurological:     Mental Status: He is alert and oriented to person, place, and time.     Motor: Tremor present. No weakness, atrophy or abnormal muscle tone.     Coordination: Coordination is intact.     Gait: Gait is intact.     Comments: Mild tremor of chin, left hand with kinetic tremor Normal speech and voice tone. No cogwheel rigidity noted, no resting or postural tremor  Psychiatric:        Mood and Affect: Mood normal.        Behavior: Behavior normal.        Thought Content: Thought content normal.     Assessment & Plan:  This visit occurred during the SARS-CoV-2 public health emergency.  Safety protocols were in place, including screening questions prior to the visit, additional usage of staff PPE, and extensive cleaning of exam room while observing appropriate contact time as indicated for disinfecting solutions.   [name] was seen today for follow-up.  Diagnoses and all orders for this visit:  Essential hypertension -     Cancel: Comprehensive metabolic panel -     Comprehensive metabolic panel  Stage 3a chronic kidney disease -     Cancel: Comprehensive metabolic panel -      Comprehensive metabolic panel  Elevated PSA -     Cancel: PSA -     PSA  Thrombocytopenia (HCC) -     Cancel: CBC with Differential/Platelet -     CBC with Differential/Platelet  Essential tremor    Problem List Items Addressed This Visit      Cardiovascular and Mediastinum   Essential hypertension - Primary    BP at goal with hydralazine, and metoprolol Under care of Dr. Martinique (cardiology) Wt Readings from Last 3 Encounters:  04/22/20 205 lb 12.8 oz (93.4 kg)  11/19/19 208 lb (94.3 kg)  01/30/19 216 lb 9.6 oz (98.2 kg)   Maintain current medications  Repeat CMP      Relevant Orders   Comprehensive metabolic panel     Nervous and Auditory   Essential tremor    FHx of essential tremor-mother. Mild tremor of chin, left hand with kinetic tremor Normal speech and voice tone. No cogwheel rigidity noted, no resting or postural tremor denies any change in gait, or handwriting or stiffness or voice. Denies any falls.  Declined referral to neurology because it does not interfere with ADLs at this time.        Genitourinary   CKD (chronic kidney disease), stage III    Stable renal function Repeat CMP today      Relevant Orders   Comprehensive metabolic panel     Other   Elevated PSA    No nocturia or urinary frequency or incontinence or urgency or hematuria Repeat PSA today      Relevant Orders   PSA   Thrombocytopenia ([place])   Relevant Orders   CBC with Differential/Platelet      Follow-up: Return in about 1 year (around 04/22/2021) for AWV with wellness coach and CPE with me.  Wilfred Lacy, NP

## 2020-04-22 NOTE — Assessment & Plan Note (Addendum)
FHx of essential tremor-mother. Mild tremor of chin, left hand with kinetic tremor Normal speech and voice tone. No cogwheel rigidity noted, no resting or postural tremor denies any change in gait, or handwriting or stiffness or voice. Denies any falls.  Declined referral to neurology because it does not interfere with ADLs at this time.

## 2020-04-22 NOTE — Assessment & Plan Note (Signed)
No nocturia or urinary frequency or incontinence or urgency or hematuria Repeat PSA today

## 2020-04-22 NOTE — Assessment & Plan Note (Signed)
BP at goal with hydralazine, and metoprolol Under care of Dr. Martinique (cardiology) Wt Readings from Last 3 Encounters:  04/22/20 205 lb 12.8 oz (93.4 kg)  11/19/19 208 lb (94.3 kg)  01/30/19 216 lb 9.6 oz (98.2 kg)   Maintain current medications Repeat CMP

## 2020-04-22 NOTE — Patient Instructions (Signed)
Go to lab for blood draw  Schedule appt with dermatologist

## 2020-04-23 LAB — COMPREHENSIVE METABOLIC PANEL
ALT: 12 IU/L (ref 0–44)
AST: 17 IU/L (ref 0–40)
Albumin/Globulin Ratio: 2.4 — ABNORMAL HIGH (ref 1.2–2.2)
Albumin: 4.7 g/dL (ref 3.7–4.7)
Alkaline Phosphatase: 95 IU/L (ref 44–121)
BUN/Creatinine Ratio: 25 — ABNORMAL HIGH (ref 10–24)
BUN: 32 mg/dL — ABNORMAL HIGH (ref 8–27)
Bilirubin Total: 1 mg/dL (ref 0.0–1.2)
CO2: 23 mmol/L (ref 20–29)
Calcium: 9.6 mg/dL (ref 8.6–10.2)
Chloride: 107 mmol/L — ABNORMAL HIGH (ref 96–106)
Creatinine, Ser: 1.26 mg/dL (ref 0.76–1.27)
GFR calc Af Amer: 62 mL/min/{1.73_m2} (ref >59)
GFR calc non Af Amer: 54 mL/min/{1.73_m2} — ABNORMAL LOW (ref >59)
Globulin, Total: 2 g/dL (ref 1.5–4.5)
Glucose: 96 mg/dL (ref 65–99)
Potassium: 4.9 mmol/L (ref 3.5–5.2)
Sodium: 145 mmol/L — ABNORMAL HIGH (ref 134–144)
Total Protein: 6.7 g/dL (ref 6.0–8.5)

## 2020-04-23 LAB — CBC WITH DIFFERENTIAL/PLATELET
Basophils Absolute: 0 10*3/uL (ref 0.0–0.2)
Basos: 1 %
EOS (ABSOLUTE): 0.1 10*3/uL (ref 0.0–0.4)
Eos: 2 %
Hematocrit: 40.5 % (ref 37.5–51.0)
Hemoglobin: 13.8 g/dL (ref 13.0–17.7)
Immature Grans (Abs): 0 10*3/uL (ref 0.0–0.1)
Immature Granulocytes: 0 %
Lymphocytes Absolute: 1.8 10*3/uL (ref 0.7–3.1)
Lymphs: 31 %
MCH: 31.2 pg (ref 26.6–33.0)
MCHC: 34.1 g/dL (ref 31.5–35.7)
MCV: 92 fL (ref 79–97)
Monocytes Absolute: 0.4 10*3/uL (ref 0.1–0.9)
Monocytes: 7 %
Neutrophils Absolute: 3.4 10*3/uL (ref 1.4–7.0)
Neutrophils: 59 %
Platelets: 162 10*3/uL (ref 150–450)
RBC: 4.42 x10E6/uL (ref 4.14–5.80)
RDW: 13 % (ref 11.6–15.4)
WBC: 5.7 10*3/uL (ref 3.4–10.8)

## 2020-04-23 LAB — PSA: Prostate Specific Ag, Serum: 3.9 ng/mL (ref 0.0–4.0)

## 2020-06-13 ENCOUNTER — Telehealth: Payer: Self-pay | Admitting: Nurse Practitioner

## 2020-06-13 NOTE — Telephone Encounter (Signed)
Left message for patient to schedule Annual Wellness Visit.  Please schedule with Nurse Health Advisor Martha Stanley, RN at [facility]  °

## 2020-06-16 DIAGNOSIS — L820 Inflamed seborrheic keratosis: Secondary | ICD-10-CM | POA: Diagnosis not present

## 2020-06-16 DIAGNOSIS — C4441 Basal cell carcinoma of skin of scalp and neck: Secondary | ICD-10-CM | POA: Diagnosis not present

## 2020-06-16 DIAGNOSIS — C44219 Basal cell carcinoma of skin of left ear and external auricular canal: Secondary | ICD-10-CM | POA: Diagnosis not present

## 2020-06-16 DIAGNOSIS — L821 Other seborrheic keratosis: Secondary | ICD-10-CM | POA: Diagnosis not present

## 2020-06-16 DIAGNOSIS — Z85828 Personal history of other malignant neoplasm of skin: Secondary | ICD-10-CM | POA: Diagnosis not present

## 2020-06-16 DIAGNOSIS — D485 Neoplasm of uncertain behavior of skin: Secondary | ICD-10-CM | POA: Diagnosis not present

## 2020-06-22 ENCOUNTER — Other Ambulatory Visit: Payer: Self-pay | Admitting: Nurse Practitioner

## 2020-06-22 DIAGNOSIS — Z7901 Long term (current) use of anticoagulants: Secondary | ICD-10-CM

## 2020-07-04 ENCOUNTER — Other Ambulatory Visit: Payer: Self-pay | Admitting: Cardiology

## 2020-07-11 DIAGNOSIS — C44212 Basal cell carcinoma of skin of right ear and external auricular canal: Secondary | ICD-10-CM | POA: Diagnosis not present

## 2020-08-30 ENCOUNTER — Telehealth: Payer: Self-pay | Admitting: Cardiology

## 2020-08-30 NOTE — Telephone Encounter (Signed)
Routed to primary nurse 

## 2020-08-30 NOTE — Telephone Encounter (Signed)
Patient is calling requesting to speak to Malachy Mood about forms that he needs Dr. Martinique to fill out for him. Please advise.

## 2020-08-30 NOTE — Telephone Encounter (Signed)
Spoke to patient he stated he has Pradaxa patient assistance forms for Dr.Jorda to complete and sign.He will bring to office tomorrow 08/31/20.He requested when completed he will pick up.

## 2020-08-31 MED ORDER — DABIGATRAN ETEXILATE MESYLATE 150 MG PO CAPS: 150.0000 mg | ORAL_CAPSULE | Freq: Two times a day (BID) | ORAL | 3 refills | Status: DC

## 2020-08-31 NOTE — Telephone Encounter (Signed)
Received patient assistance forms for Pradaxa.Forms completed and faxed to [employer] fax # [phone].

## 2020-09-15 ENCOUNTER — Other Ambulatory Visit: Payer: Self-pay | Admitting: Nurse Practitioner

## 2020-09-15 DIAGNOSIS — Z7901 Long term (current) use of anticoagulants: Secondary | ICD-10-CM

## 2020-09-19 NOTE — Telephone Encounter (Signed)
Spoke to patient Pradaxa patient assistance approved.

## 2020-09-30 ENCOUNTER — Other Ambulatory Visit: Payer: Self-pay | Admitting: Cardiology

## 2020-10-04 ENCOUNTER — Other Ambulatory Visit: Payer: Self-pay | Admitting: Cardiology

## 2020-10-29 ENCOUNTER — Other Ambulatory Visit: Payer: Self-pay | Admitting: Cardiovascular Disease

## 2020-12-01 NOTE — Progress Notes (Signed)
[name] Date of Birth: Aug 16, 1940   History of Present Illness: [name] is seen today for followup of  atrial fibrillation and HTN. He has a history of chronic atrial fibrillation. He failed medical therapy with Sotalol so he has been managed with rate control and anticoagulation with Pradaxa.   He did have an upper GI bleed in July 2019. Was transfused one unit PRBC. Upper EGD showed a Dieulafoy lesion in the stomach. His pradaxa was later resumed and he has no further bleeding.   On follow up today he feels great. He denies any chest pain, SOB, palpitations, dizziness, or edema. Energy level is good. He stays active. No bleeding problems. He rarely uses lasix for swelling.   Current Outpatient Medications on File Prior to Visit  Medication Sig Dispense Refill  . Acetaminophen (TYLENOL ARTHRITIS PAIN PO) Take by mouth.    . benazepril (LOTENSIN) 20 MG tablet Take 1 tablet by mouth twice daily 180 tablet 0  . Cranberry 1000 MG CAPS Take 1,000 mg by mouth daily.    . dabigatran (PRADAXA) 150 MG CAPS capsule Take 1 capsule (150 mg total) by mouth 2 (two) times daily. 180 capsule 3  . DILT-XR 240 MG 24 hr capsule Take 1 capsule by mouth once daily 90 capsule 3  . furosemide (LASIX) 20 MG tablet Take 20 mg daily if needed for swelling 90 tablet 3  . Garlic 1610 MG CAPS Take 1 capsule by mouth daily.    . hydrALAZINE (APRESOLINE) 25 MG tablet TAKE 1 TABLET BY MOUTH THREE TIMES DAILY 270 tablet 3  . MAGNESIUM PO Take by mouth.    . metoprolol tartrate (LOPRESSOR) 50 MG tablet Take 1 tablet by mouth twice daily 180 tablet 3  . Multiple Vitamin (MULTIVITAMIN) tablet Take 1 tablet by mouth daily.    . pantoprazole (PROTONIX) 20 MG tablet Take 1 tablet by mouth once daily 90 tablet 0  . potassium chloride SA (KLOR-CON) 20 MEQ tablet TAKE 1  BY MOUTH ONCE DAILY 90 tablet 1  . sildenafil (VIAGRA) 100 MG tablet Take 100 mg by mouth daily as needed for erectile dysfunction.    . tamsulosin  (FLOMAX) 0.4 MG CAPS capsule Take 0.4 mg by mouth daily after supper.   0   No current facility-administered medications on file prior to visit.    Allergies  Allergen Reactions  . Hctz [Hydrochlorothiazide] Photosensitivity  . Cipro Iv [Ciprofloxacin] Hives and Rash    Caused red streaks up the arm    Past Medical History:  Diagnosis Date  . Acute blood loss anemia 03/01/2018  . Acute GI bleeding 02/28/2018  . Basal cell carcinoma   . Chronic atrial fibrillation ([place])   . Chronic dermatitis   . CKD (chronic kidney disease), stage III ([place])    a. Probable based on historical Cr. H/o ARF. Previously saw Dr. Florene Glen.  . Erectile dysfunction   . GI bleed   . Hypertension   . Hypokalemia   . Meningioma (HCC)    Probable 13 x 12 mm meningioma in right frontal region  . Thrombocytopenia ([place])    improved    Past Surgical History:  Procedure Laterality Date  . ESOPHAGOGASTRODUODENOSCOPY (EGD) WITH PROPOFOL N/A 03/01/2018   Procedure: ESOPHAGOGASTRODUODENOSCOPY (EGD) WITH PROPOFOL;  Surgeon: Doran Stabler, MD;  Location: [place];  Service: Gastroenterology;  Laterality: N/A;  . EYE SURGERY     spot removed from cornea  . SCHLEROTHERAPY  03/01/2018   Procedure:  SCHLEROTHERAPY OF VARICES;  Surgeon: Doran Stabler, MD;  Location: [facility];  Service: Gastroenterology;;  epinephrine 1:10,000 injection / hemostasis clips  . TEE WITH CARDIOVERSION  12/14/2004   Successful TEE guided electrical cardioversion. -- showed mild mitral insufficiency and trace pulmonic insufficiency   . TOENAIL EXCISION     ingrown toenail removal  . TONSILLECTOMY      Social History   Tobacco Use  Smoking Status Former Smoker  . Packs/day: 1.00  . Years: 10.00  . Pack years: 10.00  . Types: Cigarettes  . Quit date: 04/25/1970  . Years since quitting: 50.6  Smokeless Tobacco Never Used    Social History   Substance and Sexual Activity  Alcohol Use No    Family History  Problem  Relation Age of Onset  . Heart disease Mother        with pacemaker  . Diabetes Mother   . Asthma Mother   . Heart failure Mother   . Bladder Cancer Mother   . Hypertension Father   . Pancreatic cancer Father   . Diabetes Brother   . Hypertension Sister   . Diabetes Sister     Review of Systems: As noted in history of present illness.  All other systems were reviewed and are negative.  Physical Exam: BP (!) 142/80 (BP Location: Left Arm, Patient Position: Sitting, Cuff Size: Normal)   Pulse 66   Ht 6\' 2"  (1.88 m)   Wt 209 lb (94.8 kg)   BMI 26.83 kg/m  GENERAL:  Well appearing HEENT:  PERRL, EOMI, sclera are clear. Oropharynx is clear. NECK:  No jugular venous distention, carotid upstroke brisk and symmetric, no bruits, no thyromegaly or adenopathy LUNGS:  Clear to auscultation bilaterally CHEST:  Unremarkable HEART:  IRRR,  PMI not displaced or sustained,S1 and S2 within normal limits, no S3, no S4: no clicks, no rubs, no murmurs ABD:  Soft, nontender. BS +, no masses or bruits. No hepatomegaly, no splenomegaly EXT:  2 + pulses throughout, no edema, no cyanosis no clubbing SKIN:  Warm and dry.  No rashes NEURO:  Alert and oriented x 3. Cranial nerves II through XII intact. PSYCH:  Cognitively intact    LABORATORY DATA: Lab Results  Component Value Date   WBC 5.7 04/22/2020   HGB 13.8 04/22/2020   HCT 40.5 04/22/2020   PLT 162 04/22/2020   GLUCOSE 96 04/22/2020   CHOL 109 11/19/2019   TRIG 99 11/19/2019   HDL 46 11/19/2019   LDLCALC 44 11/19/2019   ALT 12 04/22/2020   AST 17 04/22/2020   NA 145 (H) 04/22/2020   K 4.9 04/22/2020   CL 107 (H) 04/22/2020   CREATININE 1.26 04/22/2020   BUN 32 (H) 04/22/2020   CO2 23 04/22/2020   TSH 1.400 11/19/2019   PSA 3.88 12/23/2017   INR 1.55 03/01/2018    Ecg today shows AFib with rate 66. Otherwise normal. I have personally reviewed and interpreted this study.    Assessment / Plan: 1. Atrial fibrillation.   permanent. He is asymptomatic. Rate is well controlled. Continue Pradaxa.  We will continue a long-term strategy of rate control and anticoagulation.   2. Hypertension. Blood pressure is well controlled.     I will follow up in 1 year.

## 2020-12-05 ENCOUNTER — Encounter: Payer: Self-pay | Admitting: Cardiology

## 2020-12-05 ENCOUNTER — Other Ambulatory Visit: Payer: Self-pay

## 2020-12-05 ENCOUNTER — Ambulatory Visit: Payer: Medicare Other | Admitting: Cardiology

## 2020-12-05 VITALS — BP 142/80 | HR 66 | Ht 74.0 in | Wt 209.0 lb

## 2020-12-05 DIAGNOSIS — I482 Chronic atrial fibrillation, unspecified: Secondary | ICD-10-CM

## 2020-12-05 DIAGNOSIS — Z7901 Long term (current) use of anticoagulants: Secondary | ICD-10-CM | POA: Diagnosis not present

## 2020-12-05 DIAGNOSIS — I10 Essential (primary) hypertension: Secondary | ICD-10-CM

## 2020-12-22 ENCOUNTER — Other Ambulatory Visit: Payer: Self-pay | Admitting: Family

## 2020-12-22 DIAGNOSIS — Z7901 Long term (current) use of anticoagulants: Secondary | ICD-10-CM

## 2020-12-27 ENCOUNTER — Ambulatory Visit (INDEPENDENT_AMBULATORY_CARE_PROVIDER_SITE_OTHER): Payer: Medicare Other

## 2020-12-27 VITALS — Ht 74.0 in | Wt 209.0 lb

## 2020-12-27 DIAGNOSIS — Z0000 Encounter for general adult medical examination without abnormal findings: Secondary | ICD-10-CM | POA: Diagnosis not present

## 2020-12-27 NOTE — Patient Instructions (Signed)
[name] , Thank you for taking time to complete your Medicare Wellness Visit. I appreciate your ongoing commitment to your health goals. Please review the following plan we discussed and let me know if I can assist you in the future.   Screening recommendations/referrals: Colonoscopy: No longer required Recommended yearly ophthalmology/optometry visit for glaucoma screening and checkup Recommended yearly dental visit for hygiene and checkup  Vaccinations: Influenza vaccine: Up to date Pneumococcal vaccine: Completed vaccines Tdap vaccine: Up to date-Due-04/25/2026 Shingles vaccine: Discuss with pharmacy   Covid-19: Up to date  Advanced directives: Information mailed  Conditions/risks identified: See problem list  Next appointment: Follow up in one year for your annual wellness visit. 01/02/2022 @ 12:45  Preventive Care 81 Years and Older, Male Preventive care refers to lifestyle choices and visits with your health care provider that can promote health and wellness. What does preventive care include?  A yearly physical exam. This is also called an annual well check.  Dental exams once or twice a year.  Routine eye exams. Ask your health care provider how often you should have your eyes checked.  Personal lifestyle choices, including:  Daily care of your teeth and gums.  Regular physical activity.  Eating a healthy diet.  Avoiding tobacco and drug use.  Limiting alcohol use.  Practicing safe sex.  Taking low doses of aspirin every day.  Taking vitamin and mineral supplements as recommended by your health care provider. What happens during an annual well check? The services and screenings done by your health care provider during your annual well check will depend on your age, overall health, lifestyle risk factors, and family history of disease. Counseling  Your health care provider may ask you questions about your:  Alcohol use.  Tobacco use.  Drug  use.  Emotional well-being.  Home and relationship well-being.  Sexual activity.  Eating habits.  History of falls.  Memory and ability to understand (cognition).  Work and work Statistician. Screening  You may have the following tests or measurements:  Height, weight, and BMI.  Blood pressure.  Lipid and cholesterol levels. These may be checked every 5 years, or more frequently if you are over 81 years old.  Skin check.  Lung cancer screening. You may have this screening every year starting at age 81 if you have a 30-pack-year history of smoking and currently smoke or have quit within the past 15 years.  Fecal occult blood test (FOBT) of the stool. You may have this test every year starting at age 81.  Flexible sigmoidoscopy or colonoscopy. You may have a sigmoidoscopy every 5 years or a colonoscopy every 10 years starting at age 81.  Prostate cancer screening. Recommendations will vary depending on your family history and other risks.  Hepatitis C blood test.  Hepatitis B blood test.  Sexually transmitted disease (STD) testing.  Diabetes screening. This is done by checking your blood sugar (glucose) after you have not eaten for a while (fasting). You may have this done every 1-3 years.  Abdominal aortic aneurysm (AAA) screening. You may need this if you are a current or former smoker.  Osteoporosis. You may be screened starting at age 48 if you are at high risk. Talk with your health care provider about your test results, treatment options, and if necessary, the need for more tests. Vaccines  Your health care provider may recommend certain vaccines, such as:  Influenza vaccine. This is recommended every year.  Tetanus, diphtheria, and acellular pertussis (Tdap, Td) vaccine.  You may need a Td booster every 10 years.  Zoster vaccine. You may need this after age 81.  Pneumococcal 13-valent conjugate (PCV13) vaccine. One dose is recommended after age  81.  Pneumococcal polysaccharide (PPSV23) vaccine. One dose is recommended after age 81. Talk to your health care provider about which screenings and vaccines you need and how often you need them. This information is not intended to replace advice given to you by your health care provider. Make sure you discuss any questions you have with your health care provider. Document Released: 08/19/2015 Document Revised: 04/11/2016 Document Reviewed: 05/24/2015 Elsevier Interactive Patient Education  2017 [place] Prevention in the Home Falls can cause injuries. They can happen to people of all ages. There are many things you can do to make your home safe and to help prevent falls. What can I do on the outside of my home?  Regularly fix the edges of walkways and driveways and fix any cracks.  Remove anything that might make you trip as you walk through a door, such as a raised step or threshold.  Trim any bushes or trees on the path to your home.  Use bright outdoor lighting.  Clear any walking paths of anything that might make someone trip, such as rocks or tools.  Regularly check to see if handrails are loose or broken. Make sure that both sides of any steps have handrails.  Any raised decks and porches should have guardrails on the edges.  Have any leaves, snow, or ice cleared regularly.  Use sand or salt on walking paths during winter.  Clean up any spills in your garage right away. This includes oil or grease spills. What can I do in the bathroom?  Use night lights.  Install grab bars by the toilet and in the tub and shower. Do not use towel bars as grab bars.  Use non-skid mats or decals in the tub or shower.  If you need to sit down in the shower, use a plastic, non-slip stool.  Keep the floor dry. Clean up any water that spills on the floor as soon as it happens.  Remove soap buildup in the tub or shower regularly.  Attach bath mats securely with double-sided  non-slip rug tape.  Do not have throw rugs and other things on the floor that can make you trip. What can I do in the bedroom?  Use night lights.  Make sure that you have a light by your bed that is easy to reach.  Do not use any sheets or blankets that are too big for your bed. They should not hang down onto the floor.  Have a firm chair that has side arms. You can use this for support while you get dressed.  Do not have throw rugs and other things on the floor that can make you trip. What can I do in the kitchen?  Clean up any spills right away.  Avoid walking on wet floors.  Keep items that you use a lot in easy-to-reach places.  If you need to reach something above you, use a strong step stool that has a grab bar.  Keep electrical cords out of the way.  Do not use floor polish or wax that makes floors slippery. If you must use wax, use non-skid floor wax.  Do not have throw rugs and other things on the floor that can make you trip. What can I do with my stairs?  Do not leave any  items on the stairs.  Make sure that there are handrails on both sides of the stairs and use them. Fix handrails that are broken or loose. Make sure that handrails are as long as the stairways.  Check any carpeting to make sure that it is firmly attached to the stairs. Fix any carpet that is loose or worn.  Avoid having throw rugs at the top or bottom of the stairs. If you do have throw rugs, attach them to the floor with carpet tape.  Make sure that you have a light switch at the top of the stairs and the bottom of the stairs. If you do not have them, ask someone to add them for you. What else can I do to help prevent falls?  Wear shoes that:  Do not have high heels.  Have rubber bottoms.  Are comfortable and fit you well.  Are closed at the toe. Do not wear sandals.  If you use a stepladder:  Make sure that it is fully opened. Do not climb a closed stepladder.  Make sure that both  sides of the stepladder are locked into place.  Ask someone to hold it for you, if possible.  Clearly mark and make sure that you can see:  Any grab bars or handrails.  First and last steps.  Where the edge of each step is.  Use tools that help you move around (mobility aids) if they are needed. These include:  Canes.  Walkers.  Scooters.  Crutches.  Turn on the lights when you go into a dark area. Replace any light bulbs as soon as they burn out.  Set up your furniture so you have a clear path. Avoid moving your furniture around.  If any of your floors are uneven, fix them.  If there are any pets around you, be aware of where they are.  Review your medicines with your doctor. Some medicines can make you feel dizzy. This can increase your chance of falling. Ask your doctor what other things that you can do to help prevent falls. This information is not intended to replace advice given to you by your health care provider. Make sure you discuss any questions you have with your health care provider. Document Released: 05/19/2009 Document Revised: 12/29/2015 Document Reviewed: 08/27/2014 Elsevier Interactive Patient Education  2017 [employer].

## 2020-12-27 NOTE — Progress Notes (Signed)
Subjective:   [name] is a 80 y.o. male who presents for Medicare Annual/Subsequent preventive examination.  I connected with Brenin today by telephone and verified that I am speaking with the correct person using two identifiers. Location patient: home Location provider: work Persons participating in the virtual visit: patient, Marine scientist.    I discussed the limitations, risks, security and privacy concerns of performing an evaluation and management service by telephone and the availability of in person appointments. I also discussed with the patient that there may be a patient responsible charge related to this service. The patient expressed understanding and verbally consented to this telephonic visit.    Interactive audio and video telecommunications were attempted between this provider and patient, however failed, due to patient having technical difficulties OR patient did not have access to video capability.  We continued and completed visit with audio only.  Some vital signs may be absent or patient reported.   Time Spent with patient on telephone encounter: 20 minutes   Review of Systems     Cardiac Risk Factors include: advanced age (>73men, >40 women);hypertension;male gender     Objective:    Today's Vitals   12/27/20 1138  Weight: 209 lb (94.8 kg)  Height: 6\' 2"  (1.88 m)   Body mass index is 26.83 kg/m.  Advanced Directives 12/27/2020 02/28/2018 06/21/2017 06/12/2016 10/31/2015 08/27/2014 08/27/2014  Does Patient Have a Medical Advance Directive? No No No No No No No  Would patient like information on creating a medical advance directive? Yes (MAU/Ambulatory/Procedural Areas - Information given) Yes (Inpatient - patient requests chaplain consult to create a medical advance directive) Yes (MAU/Ambulatory/Procedural Areas - Information given) Yes - Educational materials given - No - patient declined information No - patient declined information    Current Medications  (verified) Outpatient Encounter Medications as of 12/27/2020  Medication Sig  . Acetaminophen (TYLENOL ARTHRITIS PAIN PO) Take by mouth.  . benazepril (LOTENSIN) 20 MG tablet Take 1 tablet by mouth twice daily  . Cranberry 1000 MG CAPS Take 1,000 mg by mouth daily.  . dabigatran (PRADAXA) 150 MG CAPS capsule Take 1 capsule (150 mg total) by mouth 2 (two) times daily.  Marland Kitchen DILT-XR 240 MG 24 hr capsule Take 1 capsule by mouth once daily  . furosemide (LASIX) 20 MG tablet Take 20 mg daily if needed for swelling  . Garlic 6387 MG CAPS Take 1 capsule by mouth daily.  . hydrALAZINE (APRESOLINE) 25 MG tablet TAKE 1 TABLET BY MOUTH THREE TIMES DAILY  . MAGNESIUM PO Take by mouth.  . metoprolol tartrate (LOPRESSOR) 50 MG tablet Take 1 tablet by mouth twice daily  . Multiple Vitamin (MULTIVITAMIN) tablet Take 1 tablet by mouth daily.  . pantoprazole (PROTONIX) 20 MG tablet Take 1 tablet by mouth once daily  . potassium chloride SA (KLOR-CON) 20 MEQ tablet TAKE 1  BY MOUTH ONCE DAILY  . sildenafil (VIAGRA) 100 MG tablet Take 100 mg by mouth daily as needed for erectile dysfunction.  . tamsulosin (FLOMAX) 0.4 MG CAPS capsule Take 0.4 mg by mouth daily after supper.    No facility-administered encounter medications on file as of 12/27/2020.    Allergies (verified) Hctz [hydrochlorothiazide] and Cipro iv [ciprofloxacin]   History: Past Medical History:  Diagnosis Date  . Acute blood loss anemia 03/01/2018  . Acute GI bleeding 02/28/2018  . Basal cell carcinoma   . Chronic atrial fibrillation ([place])   . Chronic dermatitis   . CKD (chronic kidney disease), stage  III ([place])    a. Probable based on historical Cr. H/o ARF. Previously saw Dr. Florene Glen.  . Erectile dysfunction   . GI bleed   . Hypertension   . Hypokalemia   . Meningioma ([facility])    Probable 13 x 12 mm meningioma in right frontal region  . Thrombocytopenia ([place])    improved   Past Surgical History:  Procedure Laterality Date  .  ESOPHAGOGASTRODUODENOSCOPY (EGD) WITH PROPOFOL N/A 03/01/2018   Procedure: ESOPHAGOGASTRODUODENOSCOPY (EGD) WITH PROPOFOL;  Surgeon: Doran Stabler, MD;  Location: [facility];  Service: Gastroenterology;  Laterality: N/A;  . EYE SURGERY     spot removed from cornea  . SCHLEROTHERAPY  03/01/2018   Procedure: SCHLEROTHERAPY OF VARICES;  Surgeon: Doran Stabler, MD;  Location: [facility];  Service: Gastroenterology;;  epinephrine 1:10,000 injection / hemostasis clips  . TEE WITH CARDIOVERSION  12/14/2004   Successful TEE guided electrical cardioversion. -- showed mild mitral insufficiency and trace pulmonic insufficiency   . TOENAIL EXCISION     ingrown toenail removal  . TONSILLECTOMY     Family History  Problem Relation Age of Onset  . Heart disease Mother        with pacemaker  . Diabetes Mother   . Asthma Mother   . Heart failure Mother   . Bladder Cancer Mother   . Hypertension Father   . Pancreatic cancer Father   . Diabetes Brother   . Hypertension Sister   . Diabetes Sister    Social History   Socioeconomic History  . Marital status: Married    Spouse name: Not on file  . Number of children: 2  . Years of education: Not on file  . Highest education level: Not on file  Occupational History  . Occupation: managed salvage yard    Employer: RETIRED  Tobacco Use  . Smoking status: Former Smoker    Packs/day: 1.00    Years: 10.00    Pack years: 10.00    Types: Cigarettes    Quit date: 04/25/1970    Years since quitting: 50.7  . Smokeless tobacco: Never Used  Vaping Use  . Vaping Use: Never used  Substance and Sexual Activity  . Alcohol use: No  . Drug use: No  . Sexual activity: Yes  Other Topics Concern  . Not on file  Social History Narrative   Keeps sister who is mentally retarded.   Social Determinants of Health   Financial Resource Strain: Low Risk   . Difficulty of Paying Living Expenses: Not hard at all  Food Insecurity: No Food Insecurity   . Worried About Charity fundraiser in the Last Year: Never true  . Ran Out of Food in the Last Year: Never true  Transportation Needs: No Transportation Needs  . Lack of Transportation (Medical): No  . Lack of Transportation (Non-Medical): No  Physical Activity: Inactive  . Days of Exercise per Week: 0 days  . Minutes of Exercise per Session: 0 min  Stress: No Stress Concern Present  . Feeling of Stress : Not at all  Social Connections: Moderately Integrated  . Frequency of Communication with Friends and Family: More than three times a week  . Frequency of Social Gatherings with Friends and Family: More than three times a week  . Attends Religious Services: More than 4 times per year  . Active Member of Clubs or Organizations: No  . Attends Archivist Meetings: Never  . Marital Status: Married  Tobacco Counseling Counseling given: Not Answered   Clinical Intake:  Pre-visit preparation completed: Yes  Pain : No/denies pain     Nutritional Status: BMI 25 -29 Overweight Nutritional Risks: None Diabetes: No  How often do you need to have someone help you when you read instructions, pamphlets, or other written materials from your doctor or pharmacy?: 1 - Never  Diabetic?No  Interpreter Needed?: No  Information entered by :: Caroleen Hamman LPN   Activities of Daily Living In your present state of health, do you have any difficulty performing the following activities: 12/27/2020  Hearing? N  Vision? N  Difficulty concentrating or making decisions? N  Walking or climbing stairs? N  Dressing or bathing? N  Doing errands, shopping? N  Preparing Food and eating ? N  Using the Toilet? N  In the past six months, have you accidently leaked urine? N  Do you have problems with loss of bowel control? N  Managing your Medications? N  Managing your Finances? N  Housekeeping or managing your Housekeeping? N  Some recent data might be hidden    Patient Care  Team: Nche, Charlene Brooke, NP as PCP - General (Internal Medicine) Martinique, Peter M, MD as Consulting Physician (Cardiology) Rana Snare, MD (Inactive) as Consulting Physician (Urology) Darleen Crocker, MD as Consulting Physician (Ophthalmology) Rolm Bookbinder, MD as Consulting Physician (Dermatology)  Indicate any recent Medical Services you may have received from other than [facility] providers in the past year (date may be approximate).     Assessment:   This is a routine wellness examination for Makell.  Hearing/Vision screen  Hearing Screening   125Hz  250Hz  500Hz  1000Hz  2000Hz  3000Hz  4000Hz  6000Hz  8000Hz   Right ear:           Left ear:           Comments: No issues  Vision Screening Comments: Reading glasses Last eye exam-unsure of date  Dietary issues and exercise activities discussed: Current Exercise Habits: The patient does not participate in regular exercise at present, Exercise limited by: None identified  Goals Addressed            This Visit's Progress   . Patient Stated       Drink more water      Depression Screen PHQ 2/9 Scores 12/27/2020 01/30/2019 12/23/2017 06/21/2017 06/12/2016 05/20/2015 05/18/2014  PHQ - 2 Score 0 0 0 0 0 0 0    Fall Risk Fall Risk  12/27/2020 01/30/2019 06/21/2017 06/12/2016 05/20/2015  Falls in the past year? 0 0 No Yes No  Number falls in past yr: 0 - - (No Data) -  Comment - - - patient report tripping over something but didn't have injuries -  Injury with Fall? 0 - - No -  Follow up Falls prevention discussed - - - -    FALL RISK PREVENTION PERTAINING TO THE HOME:  Any stairs in or around the home? Yes  If so, are there any without handrails? No  Home free of loose throw rugs in walkways, pet beds, electrical cords, etc? Yes  Adequate lighting in your home to reduce risk of falls? Yes   ASSISTIVE DEVICES UTILIZED TO PREVENT FALLS:  Life alert? No  Use of a cane, walker or w/c? No  Grab bars in the bathroom? Yes  Shower chair  or bench in shower? No  Elevated toilet seat or a handicapped toilet? No   TIMED UP AND GO:  Was the test performed? No . Phone visit  Cognitive Function:Normal cognitive status assessed by this Nurse Health Advisor. No abnormalities found.   MMSE - Mini Mental State Exam 06/21/2017 06/12/2016 06/12/2016  Orientation to time 5 5 5   Orientation to Place 5 5 -  Registration 3 3 -  Attention/ Calculation 5 5 -  Recall 3 3 -  Language- name 2 objects 2 2 -  Language- repeat 1 1 -  Language- follow 3 step command 3 3 -  Language- read & follow direction 1 1 -  Write a sentence 1 1 -  Copy design 1 1 -  Total score 30 30 -        Immunizations Immunization History  Administered Date(s) Administered  . Influenza Split 05/13/2012  . Influenza Whole 04/06/2010  . Influenza, High Dose Seasonal PF 06/12/2016, 04/18/2017  . Influenza,inj,Quad PF,6+ Mos 05/13/2013, 05/18/2014, 05/20/2015  . Influenza-Unspecified 04/18/2020  . PFIZER(Purple Top)SARS-COV-2 Vaccination 08/26/2019, 09/16/2019  . Pneumococcal Conjugate-13 05/18/2014  . Pneumococcal Polysaccharide-23 05/20/2006  . Zoster 08/09/2008    TDAP status: Up to date  Flu Vaccine status: Up to date  Pneumococcal vaccine status: Up to date  Covid-19 vaccine status: Completed vaccines  Qualifies for Shingles Vaccine? Yes   Zostavax completed Yes   Shingrix Completed?: No.    Education has been provided regarding the importance of this vaccine. Patient has been advised to call insurance company to determine out of pocket expense if they have not yet received this vaccine. Advised may also receive vaccine at local pharmacy or Health Dept. Verbalized acceptance and understanding.  Screening Tests Health Maintenance  Topic Date Due  . COVID-19 Vaccine (3 - Pfizer risk 4-dose series) 10/14/2019  . INFLUENZA VACCINE  03/06/2021  . TETANUS/TDAP  04/25/2026  . PNA vac Low Risk Adult  Completed  . HPV VACCINES  Aged Out     Health Maintenance  Health Maintenance Due  Topic Date Due  . COVID-19 Vaccine (3 - Pfizer risk 4-dose series) 10/14/2019    Colorectal cancer screening: No longer required.   Lung Cancer Screening: (Low Dose CT Chest recommended if Age 58-80 years, 30 pack-year currently smoking OR have quit w/in 15years.) does not qualify.     Additional Screening:  Hepatitis C Screening: does not qualify  Vision Screening: Recommended annual ophthalmology exams for early detection of glaucoma and other disorders of the eye. Is the patient up to date with their annual eye exam?  No  Who is the provider or what is the name of the office in which the patient attends annual eye exams? Dr. Tamala Julian Patient plans to make an appt soon  Dental Screening: Recommended annual dental exams for proper oral hygiene  Community Resource Referral / Chronic Care Management: CRR required this visit?  No   CCM required this visit?  No      Plan:     I have personally reviewed and noted the following in the patient's chart:   . Medical and social history . Use of alcohol, tobacco or illicit drugs  . Current medications and supplements including opioid prescriptions. Patient is not currently taking opioid prescriptions. . Functional ability and status . Nutritional status . Physical activity . Advanced directives . List of other physicians . Hospitalizations, surgeries, and ER visits in previous 12 months . Vitals . Screenings to include cognitive, depression, and falls . Referrals and appointments  In addition, I have reviewed and discussed with patient certain preventive protocols, quality metrics, and best practice recommendations. A written personalized care plan for  preventive services as well as general preventive health recommendations were provided to patient.   Due to this being a telephonic visit, the after visit summary with patients personalized plan was offered to patient via mail or  my-chart.Patient would like to access on my-chart.   Marta Antu, LPN   2/75/1700  Nurse Health Advisor  Nurse Notes: None

## 2020-12-28 ENCOUNTER — Telehealth: Payer: Self-pay | Admitting: Nurse Practitioner

## 2020-12-28 DIAGNOSIS — Z7901 Long term (current) use of anticoagulants: Secondary | ICD-10-CM

## 2020-12-28 NOTE — Telephone Encounter (Signed)
What is the name of the medication? pantoprazole (PROTONIX) 20 MG tablet [Pharmacy Med Name: Pantoprazole Sodium 20 MG Oral Tablet Delayed Release] [[id number]]    Have you contacted your pharmacy to request a refill? Yes, he is needing a new script.  Which pharmacy would you like this sent to? [facility] [place] - [address]  [address] [place]) [place] [address]  Phone:  [phone] Fax:  [phone]      Patient notified that their request is being sent to the clinical staff for review and that they should receive a call once it is complete. If they do not receive a call within 72 hours they can check with their pharmacy or our office.

## 2020-12-29 NOTE — Telephone Encounter (Signed)
Ok to send rx with 1refill

## 2020-12-29 NOTE — Telephone Encounter (Signed)
Patient is calling to check the status of his refill request. He said that the pharmacy gave him 3 pills until refill is sent in and he only has one left. Please advise.

## 2020-12-30 MED ORDER — PANTOPRAZOLE SODIUM 20 MG PO TBEC: 20.0000 mg | DELAYED_RELEASE_TABLET | Freq: Every day | ORAL | 1 refills | Status: DC

## 2020-12-30 NOTE — Telephone Encounter (Signed)
Rx sent to pharmacy   

## 2021-01-05 ENCOUNTER — Other Ambulatory Visit: Payer: Self-pay | Admitting: Cardiology

## 2021-01-05 DIAGNOSIS — I482 Chronic atrial fibrillation, unspecified: Secondary | ICD-10-CM

## 2021-01-30 ENCOUNTER — Other Ambulatory Visit: Payer: Self-pay | Admitting: Cardiology

## 2021-02-10 ENCOUNTER — Other Ambulatory Visit: Payer: Self-pay | Admitting: Cardiology

## 2021-02-10 DIAGNOSIS — I10 Essential (primary) hypertension: Secondary | ICD-10-CM

## 2021-03-07 DIAGNOSIS — L821 Other seborrheic keratosis: Secondary | ICD-10-CM | POA: Diagnosis not present

## 2021-03-07 DIAGNOSIS — L570 Actinic keratosis: Secondary | ICD-10-CM | POA: Diagnosis not present

## 2021-03-07 DIAGNOSIS — D0472 Carcinoma in situ of skin of left lower limb, including hip: Secondary | ICD-10-CM | POA: Diagnosis not present

## 2021-03-07 DIAGNOSIS — D225 Melanocytic nevi of trunk: Secondary | ICD-10-CM | POA: Diagnosis not present

## 2021-03-07 DIAGNOSIS — Z85828 Personal history of other malignant neoplasm of skin: Secondary | ICD-10-CM | POA: Diagnosis not present

## 2021-03-07 DIAGNOSIS — C44719 Basal cell carcinoma of skin of left lower limb, including hip: Secondary | ICD-10-CM | POA: Diagnosis not present

## 2021-03-21 ENCOUNTER — Other Ambulatory Visit: Payer: Self-pay | Admitting: Cardiology

## 2021-03-22 NOTE — Telephone Encounter (Signed)
Rx(s) sent to pharmacy electronically.  

## 2021-04-24 ENCOUNTER — Ambulatory Visit (INDEPENDENT_AMBULATORY_CARE_PROVIDER_SITE_OTHER): Payer: Medicare Other | Admitting: Nurse Practitioner

## 2021-04-24 ENCOUNTER — Other Ambulatory Visit: Payer: Self-pay

## 2021-04-24 ENCOUNTER — Encounter: Payer: Self-pay | Admitting: Nurse Practitioner

## 2021-04-24 ENCOUNTER — Other Ambulatory Visit: Payer: Self-pay | Admitting: Cardiology

## 2021-04-24 VITALS — BP 138/86 | HR 86 | Temp 97.5°F | Ht 74.0 in | Wt 207.4 lb

## 2021-04-24 DIAGNOSIS — Z136 Encounter for screening for cardiovascular disorders: Secondary | ICD-10-CM | POA: Diagnosis not present

## 2021-04-24 DIAGNOSIS — N1831 Chronic kidney disease, stage 3a: Secondary | ICD-10-CM

## 2021-04-24 DIAGNOSIS — D696 Thrombocytopenia, unspecified: Secondary | ICD-10-CM | POA: Diagnosis not present

## 2021-04-24 DIAGNOSIS — Z125 Encounter for screening for malignant neoplasm of prostate: Secondary | ICD-10-CM

## 2021-04-24 DIAGNOSIS — R972 Elevated prostate specific antigen [PSA]: Secondary | ICD-10-CM

## 2021-04-24 DIAGNOSIS — I10 Essential (primary) hypertension: Secondary | ICD-10-CM

## 2021-04-24 DIAGNOSIS — G250 Essential tremor: Secondary | ICD-10-CM | POA: Diagnosis not present

## 2021-04-24 DIAGNOSIS — Z13220 Encounter for screening for lipoid disorders: Secondary | ICD-10-CM | POA: Diagnosis not present

## 2021-04-24 DIAGNOSIS — Z0000 Encounter for general adult medical examination without abnormal findings: Secondary | ICD-10-CM | POA: Diagnosis not present

## 2021-04-24 DIAGNOSIS — Z7189 Other specified counseling: Secondary | ICD-10-CM | POA: Diagnosis not present

## 2021-04-24 DIAGNOSIS — Z23 Encounter for immunization: Secondary | ICD-10-CM

## 2021-04-24 LAB — COMPREHENSIVE METABOLIC PANEL
ALT: 14 U/L (ref 0–53)
AST: 18 U/L (ref 0–37)
Albumin: 4.6 g/dL (ref 3.5–5.2)
Alkaline Phosphatase: 89 U/L (ref 39–117)
BUN: 25 mg/dL — ABNORMAL HIGH (ref 6–23)
CO2: 25 mEq/L (ref 19–32)
Calcium: 9.7 mg/dL (ref 8.4–10.5)
Chloride: 106 mEq/L (ref 96–112)
Creatinine, Ser: 1.21 mg/dL (ref 0.40–1.50)
GFR: 56.24 mL/min — ABNORMAL LOW (ref >60.00)
Glucose, Bld: 96 mg/dL (ref 70–99)
Potassium: 4.1 mEq/L (ref 3.5–5.1)
Sodium: 142 mEq/L (ref 135–145)
Total Bilirubin: 1.3 mg/dL — ABNORMAL HIGH (ref 0.2–1.2)
Total Protein: 7.4 g/dL (ref 6.0–8.3)

## 2021-04-24 LAB — CBC
HCT: 42.7 % (ref 39.0–52.0)
Hemoglobin: 14.2 g/dL (ref 13.0–17.0)
MCHC: 33.3 g/dL (ref 30.0–36.0)
MCV: 93.7 fl (ref 78.0–100.0)
Platelets: 158 10*3/uL (ref 150.0–400.0)
RBC: 4.55 Mil/uL (ref 4.22–5.81)
RDW: 13.9 % (ref 11.5–15.5)
WBC: 6.2 10*3/uL (ref 4.0–10.5)

## 2021-04-24 LAB — LIPID PANEL
Cholesterol: 109 mg/dL (ref 0–200)
HDL: 46 mg/dL (ref >39.00)
LDL Cholesterol: 47 mg/dL (ref 0–99)
NonHDL: 63.34
Total CHOL/HDL Ratio: 2
Triglycerides: 81 mg/dL (ref 0.0–149.0)
VLDL: 16.2 mg/dL (ref 0.0–40.0)

## 2021-04-24 LAB — PSA, MEDICARE: PSA: 4.03 ng/ml — ABNORMAL HIGH (ref 0.10–4.00)

## 2021-04-24 NOTE — Patient Instructions (Addendum)
Have a copy of DNR form posted on wall at home  Go to lab for blood draw.

## 2021-04-24 NOTE — Assessment & Plan Note (Signed)
No LUTs Repeat PSA

## 2021-04-24 NOTE — Progress Notes (Signed)
Subjective:  Patient ID: [name], male    DOB: [birth year]  Age: 80 y.o. MRN: [id number]  CC: Annual Exam (Wellness check)  HPI  Had annual visit of dermatology: atypical lesions removed from forehead and left LE.  Had annual appt with urology. No psa checked. He denies any urinary symptoms.  He has Upcoming appt for eye exam  He has upper partial dentures, but has not been to dentist in several years.  Bilateral hearing loss: denies referral to audiology  Depression screen [name] 2/9 04/24/2021 12/27/2020 01/30/2019  Decreased Interest 0 0 0  Down, Depressed, Hopeless 0 0 0  PHQ - 2 Score 0 0 0    MMSE - Mini Mental State Exam 04/24/2021 06/21/2017 06/12/2016  Orientation to time '5 5 5  '$ Orientation to Place '5 5 5  '$ Registration '3 3 3  '$ Attention/ Calculation '5 5 5  '$ Recall '3 3 3  '$ Language- name 2 objects '2 2 2  '$ Language- repeat '1 1 1  '$ Language- follow 3 step command '3 3 3  '$ Language- read & follow direction '1 1 1  '$ Write a sentence '1 1 1  '$ Copy design '1 1 1  '$ Total score '30 30 30    '$ Fall Risk  04/24/2021 12/27/2020 01/30/2019 06/21/2017 06/12/2016  Falls in the past year? 0 0 0 No Yes  Number falls in past yr: 0 0 - - (No Data)  Comment - - - - patient report tripping over something but didn't have injuries  Injury with Fall? 0 0 - - No  Risk for fall due to : No Fall Risks - - - -  Follow up Falls evaluation completed;Falls prevention discussed;Education provided Falls prevention discussed - - -      Essential tremor Denies any change. Normal neuro exam except action tremor in left UE.  Essential hypertension BP at goal Managed by cardiology BP Readings from Last 3 Encounters:  04/24/21 138/86  12/05/20 (!) 142/80  04/22/20 128/84    Elevated PSA No LUTs Repeat PSA  DNR (do not resuscitate) discussion Orange form provided  Thrombocytopenia ([place]) Repeat cbc  CKD (chronic kidney disease), stage III Repeat CMP  Reviewed past Medical, Social and Family history  today.  Outpatient Medications Prior to Visit  Medication Sig Dispense Refill   Acetaminophen (TYLENOL ARTHRITIS PAIN PO) Take by mouth.     benazepril (LOTENSIN) 20 MG tablet Take 1 tablet by mouth twice daily 180 tablet 2   Cranberry 1000 MG CAPS Take 1,000 mg by mouth daily.     dabigatran (PRADAXA) 150 MG CAPS capsule Take 1 capsule (150 mg total) by mouth 2 (two) times daily. 180 capsule 3   DILT-XR 240 MG 24 hr capsule Take 1 capsule by mouth once daily 90 capsule 0   furosemide (LASIX) 20 MG tablet Take 20 mg daily if needed for swelling 90 tablet 3   Garlic 123XX123 MG CAPS Take 1 capsule by mouth daily.     hydrALAZINE (APRESOLINE) 25 MG tablet TAKE 1 TABLET BY MOUTH THREE TIMES DAILY 270 tablet 0   MAGNESIUM PO Take by mouth.     metoprolol tartrate (LOPRESSOR) 50 MG tablet Take 1 tablet by mouth twice daily 180 tablet 3   Multiple Vitamin (MULTIVITAMIN) tablet Take 1 tablet by mouth daily.     pantoprazole (PROTONIX) 20 MG tablet Take 1 tablet (20 mg total) by mouth daily. 90 tablet 1   potassium chloride SA (KLOR-CON) 20 MEQ tablet TAKE 1  BY  MOUTH ONCE DAILY 90 tablet 1   sildenafil (VIAGRA) 100 MG tablet Take 100 mg by mouth daily as needed for erectile dysfunction.     tamsulosin (FLOMAX) 0.4 MG CAPS capsule Take 0.4 mg by mouth daily after supper.   0   No facility-administered medications prior to visit.    ROS See HPI  Objective:  BP 138/86 (BP Location: Left Arm, Patient Position: Sitting, Cuff Size: Large)   Pulse 86   Temp (!) 97.5 F (36.4 C) (Temporal)   Ht '6\' 2"'$  (1.88 m)   Wt 207 lb 6.4 oz (94.1 kg)   SpO2 98%   BMI 26.63 kg/m   Physical Exam Vitals reviewed.  Constitutional:      General: He is not in acute distress.    Appearance: He is well-developed.  HENT:     Right Ear: Tympanic membrane, ear canal and external ear normal.     Left Ear: Tympanic membrane, ear canal and external ear normal.  Eyes:     Extraocular Movements: Extraocular movements  intact.     Conjunctiva/sclera: Conjunctivae normal.     Pupils: Pupils are equal, round, and reactive to light.  Neck:     Thyroid: No thyroid mass, thyromegaly or thyroid tenderness.  Cardiovascular:     Rate and Rhythm: Normal rate and regular rhythm.     Pulses: Normal pulses.     Heart sounds: Normal heart sounds.  Pulmonary:     Effort: Pulmonary effort is normal. No respiratory distress.     Breath sounds: Normal breath sounds.  Chest:     Chest wall: No tenderness.  Abdominal:     General: Bowel sounds are normal.     Palpations: Abdomen is soft.  Musculoskeletal:        General: Normal range of motion.     Cervical back: Normal range of motion and neck supple.     Right lower leg: No edema.     Left lower leg: No edema.  Lymphadenopathy:     Cervical: No cervical adenopathy.  Skin:    General: Skin is warm and dry.  Neurological:     Mental Status: He is alert and oriented to person, place, and time.     Cranial Nerves: No cranial nerve deficit.     Motor: No weakness.     Gait: Gait normal.     Deep Tendon Reflexes: Reflexes are normal and symmetric.  Psychiatric:        Mood and Affect: Mood normal.        Behavior: Behavior normal.        Thought Content: Thought content normal.    Assessment & Plan:  This visit occurred during the SARS-CoV-2 public health emergency.  Safety protocols were in place, including screening questions prior to the visit, additional usage of staff PPE, and extensive cleaning of exam room while observing appropriate contact time as indicated for disinfecting solutions.   [name] was seen today for annual exam.  Diagnoses and all orders for this visit:  Essential hypertension -     Comprehensive metabolic panel  Flu vaccine need -     Flu Vaccine QUAD High Dose(Fluad)  Stage 3a chronic kidney disease (HCC) -     Comprehensive metabolic panel  Elevated PSA -     PSA, Medicare ( [place] only)  Thrombocytopenia  ([place]) -     CBC  Prostate cancer screening -     PSA, Medicare ( [place] only)  Encounter for lipid screening for cardiovascular disease -     Lipid panel  DNR (do not resuscitate) discussion  Essential tremor  Problem List Items Addressed This Visit       Cardiovascular and Mediastinum   Essential hypertension - Primary    BP at goal Managed by cardiology BP Readings from Last 3 Encounters:  04/24/21 138/86  12/05/20 (!) 142/80  04/22/20 128/84        Relevant Orders   Comprehensive metabolic panel (Completed)     Nervous and Auditory   Essential tremor    Denies any change. Normal neuro exam except action tremor in left UE.        Genitourinary   CKD (chronic kidney disease), stage III (HCC)    Repeat CMP      Relevant Orders   Comprehensive metabolic panel (Completed)     Other   DNR (do not resuscitate) discussion    Orange form provided      Elevated PSA    No LUTs Repeat PSA      Relevant Orders   PSA, Medicare ( [place] only) (Completed)   Prostate cancer screening   Relevant Orders   PSA, Medicare ( [place] only) (Completed)   Thrombocytopenia ([place])    Repeat cbc      Relevant Orders   CBC (Completed)   Other Visit Diagnoses     Flu vaccine need       Relevant Orders   Flu Vaccine QUAD High Dose(Fluad) (Completed)   Encounter for lipid screening for cardiovascular disease       Relevant Orders   Lipid panel (Completed)        Follow-up: Return in about 1 year (around 04/24/2022) for wellness exam.  Wilfred Lacy, NP

## 2021-04-24 NOTE — Assessment & Plan Note (Signed)
Repeat CMP

## 2021-04-24 NOTE — Assessment & Plan Note (Addendum)
BP at goal Managed by cardiology BP Readings from Last 3 Encounters:  04/24/21 138/86  12/05/20 (!) 142/80  04/22/20 128/84

## 2021-04-24 NOTE — Assessment & Plan Note (Signed)
Orange form provided

## 2021-04-24 NOTE — Assessment & Plan Note (Signed)
Repeat cbc

## 2021-04-24 NOTE — Assessment & Plan Note (Signed)
Denies any change. Normal neuro exam except action tremor in left UE.

## 2021-04-26 DIAGNOSIS — H524 Presbyopia: Secondary | ICD-10-CM | POA: Diagnosis not present

## 2021-04-27 ENCOUNTER — Telehealth: Payer: Self-pay | Admitting: Nurse Practitioner

## 2021-04-27 NOTE — Chronic Care Management (AMB) (Signed)
  Chronic Care Management   Outreach Note  04/27/2021 Name: [name] MRN: [id number] DOB: 03-30-1941  Referred by: Flossie Buffy, NP Reason for referral : No chief complaint on file.   An unsuccessful telephone outreach was attempted today. The patient was referred to the pharmacist for assistance with care management and care coordination.   Follow Up Plan:   Tatjana Dellinger Upstream Scheduler

## 2021-05-03 ENCOUNTER — Telehealth: Payer: Self-pay | Admitting: Nurse Practitioner

## 2021-05-03 NOTE — Progress Notes (Signed)
  Chronic Care Management   Note  05/03/2021 Name: [name] MRN: [id number] DOB: 03-Feb-1941  [name] is a 80 y.o. year old male who is a primary care patient of Nche, Charlene Brooke, NP. I reached out to [name] by phone today in response to a referral sent by Mr. Kelden Lavallee Norbeck's PCP, Nche, Charlene Brooke, NP.   Mr. Howington was given information about Chronic Care Management services today including:  CCM service includes personalized support from designated clinical staff supervised by his physician, including individualized plan of care and coordination with other care providers 24/7 contact phone numbers for assistance for urgent and routine care needs. Service will only be billed when office clinical staff spend 20 minutes or more in a month to coordinate care. Only one practitioner may furnish and bill the service in a calendar month. The patient may stop CCM services at any time (effective at the end of the month) by phone call to the office staff.   LAURA Duignan /WIFE verbally agreed to assistance and services provided by embedded care coordination/care management team today.  Follow up plan:   Tatjana Secretary/administrator

## 2021-05-04 ENCOUNTER — Other Ambulatory Visit: Payer: Self-pay | Admitting: Cardiology

## 2021-05-15 ENCOUNTER — Other Ambulatory Visit: Payer: Self-pay | Admitting: Cardiology

## 2021-05-15 DIAGNOSIS — I10 Essential (primary) hypertension: Secondary | ICD-10-CM

## 2021-05-17 ENCOUNTER — Telehealth: Payer: Self-pay | Admitting: Cardiology

## 2021-05-17 ENCOUNTER — Other Ambulatory Visit: Payer: Self-pay

## 2021-05-17 NOTE — Telephone Encounter (Signed)
*  STAT* If patient is at the pharmacy, call can be transferred to refill team.   1. Which medications need to be refilled? (please list name of each medication and dose if known)  DILT-XR 240 MG 24 hr capsule  2. Which pharmacy/location (including street and city if local pharmacy) is medication to be sent to? [facility] [place] - [address]  3. Do they need a 30 day or 90 day supply?  [facility] IS COMPLETELY OUT OF THIS MEDICINE

## 2021-06-09 DIAGNOSIS — D0472 Carcinoma in situ of skin of left lower limb, including hip: Secondary | ICD-10-CM | POA: Diagnosis not present

## 2021-06-09 DIAGNOSIS — L570 Actinic keratosis: Secondary | ICD-10-CM | POA: Diagnosis not present

## 2021-06-09 DIAGNOSIS — D225 Melanocytic nevi of trunk: Secondary | ICD-10-CM | POA: Diagnosis not present

## 2021-06-09 DIAGNOSIS — C44719 Basal cell carcinoma of skin of left lower limb, including hip: Secondary | ICD-10-CM | POA: Diagnosis not present

## 2021-06-09 DIAGNOSIS — L821 Other seborrheic keratosis: Secondary | ICD-10-CM | POA: Diagnosis not present

## 2021-06-09 DIAGNOSIS — Z85828 Personal history of other malignant neoplasm of skin: Secondary | ICD-10-CM | POA: Diagnosis not present

## 2021-06-20 ENCOUNTER — Other Ambulatory Visit: Payer: Self-pay | Admitting: Nurse Practitioner

## 2021-06-20 DIAGNOSIS — Z7901 Long term (current) use of anticoagulants: Secondary | ICD-10-CM

## 2021-06-21 ENCOUNTER — Telehealth: Payer: Self-pay

## 2021-06-21 NOTE — Progress Notes (Signed)
    Chronic Care Management Pharmacy Assistant   Name: [name]  MRN: [id number] DOB: 11-02-1940  Chart Review for the clinical pharmacist for 06/22/2021 at 1:30 pm  Conditions to be addressed/monitored: HTN, CKD Stage III, and Chronic atrial fibrillation, Essential tremor  Primary concerns for visit include: None ID   Recent office visits:  04/24/2021 Wilfred Lacy NP (PCP) No Medication Changes noted, Lab work Completed 12/27/2020 Caroleen Hamman LPN (PCP office) Medicare Wellness completed   Recent consult visits:  No recent consult visit  Hospital visits:  None in previous 6 months   Medications: Outpatient Encounter Medications as of 06/21/2021  Medication Sig   KLOR-CON M20 20 MEQ tablet TAKE 1  BY MOUTH ONCE DAILY(SCHEDULE AN APPT FOR FUTURE REFILLS)   Acetaminophen (TYLENOL ARTHRITIS PAIN PO) Take by mouth.   benazepril (LOTENSIN) 20 MG tablet Take 1 tablet by mouth twice daily   Cranberry 1000 MG CAPS Take 1,000 mg by mouth daily.   dabigatran (PRADAXA) 150 MG CAPS capsule Take 1 capsule (150 mg total) by mouth 2 (two) times daily.   diltiazem (DILT-XR) 240 MG 24 hr capsule Take 1 capsule by mouth once daily   furosemide (LASIX) 20 MG tablet Take 20 mg daily if needed for swelling   Garlic 3976 MG CAPS Take 1 capsule by mouth daily.   hydrALAZINE (APRESOLINE) 25 MG tablet TAKE 1 TABLET BY MOUTH THREE TIMES DAILY   MAGNESIUM PO Take by mouth.   metoprolol tartrate (LOPRESSOR) 50 MG tablet Take 1 tablet by mouth twice daily   Multiple Vitamin (MULTIVITAMIN) tablet Take 1 tablet by mouth daily.   pantoprazole (PROTONIX) 20 MG tablet Take 1 tablet (20 mg total) by mouth daily.   sildenafil (VIAGRA) 100 MG tablet Take 100 mg by mouth daily as needed for erectile dysfunction.   tamsulosin (FLOMAX) 0.4 MG CAPS capsule Take 0.4 mg by mouth daily after supper.    No facility-administered encounter medications on file as of 06/21/2021.    Care Gaps: Shingrix  Vaccine COVID-19 Vaccine  Star Rating Drugs: Benazepril 20 mg Medication Fill Gaps: None ID  Anderson [place] Clinical Pharmacist Assistant [phone]

## 2021-06-22 ENCOUNTER — Ambulatory Visit (INDEPENDENT_AMBULATORY_CARE_PROVIDER_SITE_OTHER): Payer: Medicare Other

## 2021-06-22 ENCOUNTER — Other Ambulatory Visit: Payer: Self-pay

## 2021-06-22 DIAGNOSIS — I10 Essential (primary) hypertension: Secondary | ICD-10-CM

## 2021-06-22 DIAGNOSIS — I482 Chronic atrial fibrillation, unspecified: Secondary | ICD-10-CM

## 2021-06-22 NOTE — Progress Notes (Signed)
Chronic Care Management Pharmacy Note  07/05/2021 Name:  [name] MRN:  [id number] DOB:  1940-08-20  Summary: Patient presents for initial CCM consult.  Recommendations/Changes made from today's visit: Continue current medications  Plan: CPP follow-up 6 months   Subjective: [name] is an 80 y.o. year old male who is a primary patient of Nche, Charlene Brooke, NP.  The CCM team was consulted for assistance with disease management and care coordination needs.    Engaged with patient by telephone for initial visit in response to provider referral for pharmacy case management and/or care coordination services.   Consent to Services:  The patient was given the following information about Chronic Care Management services today, agreed to services, and gave verbal consent: 1. CCM service includes personalized support from designated clinical staff supervised by the primary care provider, including individualized plan of care and coordination with other care providers 2. 24/7 contact phone numbers for assistance for urgent and routine care needs. 3. Service will only be billed when office clinical staff spend 20 minutes or more in a month to coordinate care. 4. Only one practitioner may furnish and bill the service in a calendar month. 5.The patient may stop CCM services at any time (effective at the end of the month) by phone call to the office staff. 6. The patient will be responsible for cost sharing (co-pay) of up to 20% of the service fee (after annual deductible is met). Patient agreed to services and consent obtained.  Patient Care Team: Nche, Charlene Brooke, NP as PCP - General (Internal Medicine) Martinique, Peter M, MD as Consulting Physician (Cardiology) Samuel Snare, MD (Inactive) as Consulting Physician (Urology) Samuel Crocker, MD as Consulting Physician (Ophthalmology) Samuel Bookbinder, MD as Consulting Physician (Dermatology) [name], [facility] as Pharmacist  (Pharmacist)  Recent office visits: 04/24/2021 Samuel Lacy NP (PCP) No Medication Changes noted, Lab work Completed 12/27/2020 Samuel Hamman LPN (PCP office) Medicare Wellness completed   Recent consult visits: 12/05/20: Patient presented to Dr. Martinique (Cardiology) for follow-up.   Hospital visits: None in previous 6 months   Objective:  Lab Results  Component Value Date   CREATININE 1.21 04/24/2021   BUN 25 (H) 04/24/2021   GFR 56.24 (L) 04/24/2021   GFRNONAA 54 (L) 04/22/2020   GFRAA 62 04/22/2020   NA 142 04/24/2021   K 4.1 04/24/2021   CALCIUM 9.7 04/24/2021   CO2 25 04/24/2021   GLUCOSE 96 04/24/2021    Lab Results  Component Value Date/Time   GFR 56.24 (L) 04/24/2021 10:05 AM   GFR 55.73 (L) 03/13/2018 11:34 AM    Last diabetic Eye exam: No results found for: HMDIABEYEEXA  Last diabetic Foot exam: No results found for: HMDIABFOOTEX   Lab Results  Component Value Date   CHOL 109 04/24/2021   HDL 46.00 04/24/2021   LDLCALC 47 04/24/2021   TRIG 81.0 04/24/2021   CHOLHDL 2 04/24/2021    Hepatic Function Latest Ref Rng & Units 04/24/2021 04/22/2020 01/30/2019  Total Protein 6.0 - 8.3 g/dL 7.4 6.7 6.9  Albumin 3.5 - 5.2 g/dL 4.6 4.7 -  AST 0 - 37 U/L _0 ALT 0 - 53 U/L _1 Alk Phosphatase 39 - 117 U/L 89 95 -  Total Bilirubin 0.2 - 1.2 mg/dL 1.3(H) 1.0 1.0  Bilirubin, Direct 0.0 - 0.3 mg/dL - - -    Lab Results  Component Value Date/Time   TSH 1.400 11/19/2019 03:38 PM   TSH  2.31 01/30/2019 11:06 AM    CBC Latest Ref Rng & Units 04/24/2021 04/22/2020 11/19/2019  WBC 4.0 - 10.5 K/uL 6.2 5.7 6.4  Hemoglobin 13.0 - 17.0 g/dL 14.2 13.8 14.2  Hematocrit 39.0 - 52.0 % 42.7 40.5 41.7  Platelets 150.0 - 400.0 K/uL 158.0 162 160    No results found for: VD25OH  Clinical ASCVD: No  The ASCVD Risk score (Arnett DK, et al., 2019) failed to calculate for the following reasons:   The 2019 ASCVD risk score is only valid for ages 85 to 57     Depression screen PHQ 2/9 04/24/2021 12/27/2020 01/30/2019  Decreased Interest 0 0 0  Down, Depressed, Hopeless 0 0 0  PHQ - 2 Score 0 0 0    Social History   Tobacco Use  Smoking Status Former   Packs/day: 1.00   Years: 10.00   Pack years: 10.00   Types: Cigarettes   Quit date: 04/25/1970   Years since quitting: 51.2  Smokeless Tobacco Never   BP Readings from Last 3 Encounters:  04/24/21 138/86  12/05/20 (!) 142/80  04/22/20 128/84   Pulse Readings from Last 3 Encounters:  04/24/21 86  12/05/20 66  04/22/20 78   Wt Readings from Last 3 Encounters:  04/24/21 207 lb 6.4 oz (94.1 kg)  12/27/20 209 lb (94.8 kg)  12/05/20 209 lb (94.8 kg)   BMI Readings from Last 3 Encounters:  04/24/21 26.63 kg/Thornton  12/27/20 26.83 kg/Thornton  12/05/20 26.83 kg/Thornton    Assessment/Interventions: Review of patient past medical history, allergies, medications, health status, including review of consultants reports, laboratory and other test data, was performed as part of comprehensive evaluation and provision of chronic care management services.   SDOH:  (Social Determinants of Health) assessments and interventions performed: Yes SDOH Interventions    Flowsheet Row Most Recent Value  SDOH Interventions   Financial Strain Interventions Intervention Not Indicated      SDOH Screenings   Alcohol Screen: Not on file  Depression (PHQ2-9): Low Risk    PHQ-2 Score: 0  Financial Resource Strain: Low Risk    Difficulty of Paying Living Expenses: Not hard at all  Food Insecurity: No Food Insecurity   Worried About Charity fundraiser in the Last Year: Never true   Ran Out of Food in the Last Year: Never true  Housing: Low Risk    Last Housing Risk Score: 0  Physical Activity: Inactive   Days of Exercise per Week: 0 days   Minutes of Exercise per Session: 0 min  Social Connections: Moderately Integrated   Frequency of Communication with Friends and Family: More than three times a week    Frequency of Social Gatherings with Friends and Family: More than three times a week   Attends Religious Services: More than 4 times per year   Active Member of [employer] or Organizations: No   Attends Music therapist: Never   Marital Status: Married  Stress: No Stress Concern Present   Feeling of Stress : Not at all  Tobacco Use: Medium Risk   Smoking Tobacco Use: Former   Smokeless Tobacco Use: Never   Passive Exposure: Not on Pensions consultant Needs: No Transportation Needs   Lack of Transportation (Medical): No   Lack of Transportation (Non-Medical): No    CCM Care Plan  Allergies  Allergen Reactions   Hctz [Hydrochlorothiazide] Photosensitivity   Cipro Iv [Ciprofloxacin] Hives and Rash    Caused red streaks up the arm  Medications Reviewed Today     Reviewed by [name], [facility] (Pharmacist) on 06/22/21 at 1407  Med List Status: <None>   Medication Order Taking? Sig Documenting Provider Last Dose Status Informant  Acetaminophen (TYLENOL ARTHRITIS PAIN PO) [id number] Yes Take 650 mg by mouth every 8 (eight) hours as needed. [provider] Taking Active   benazepril (LOTENSIN) 20 MG tablet [id number] Yes Take 1 tablet by mouth twice daily Martinique, Peter M, MD Taking Active   Cranberry 500 MG CAPS [id number] Yes Take 500 mg by mouth daily. [provider] Taking Active Self  dabigatran (PRADAXA) 150 MG CAPS capsule [id number] Yes Take 1 capsule (150 mg total) by mouth 2 (two) times daily. Martinique, Peter M, MD Taking Active   diltiazem (DILT-XR) 240 MG 24 hr capsule [id number] Yes Take 1 capsule by mouth once daily Martinique, Peter M, MD Taking Active   furosemide (LASIX) 20 MG tablet [id number] No Take 20 mg daily if needed for swelling  Patient not taking: Reported on 06/22/2021   Martinique, Peter M, MD Not Taking Active Self  Garlic 3154 MG CAPS [id number] Yes Take 1 capsule by mouth daily. [provider] Taking Active Self  hydrALAZINE  (APRESOLINE) 25 MG tablet [id number] Yes TAKE 1 TABLET BY MOUTH THREE TIMES DAILY Martinique, Peter M, MD Taking Active   KLOR-CON M20 20 MEQ tablet [id number] Yes TAKE 1  BY MOUTH ONCE DAILY(SCHEDULE AN APPT FOR FUTURE REFILLS) Martinique, Peter M, MD Taking Active   MAGNESIUM PO [id number] Yes Take 250 mg by mouth daily. [provider] Taking Active   metoprolol tartrate (LOPRESSOR) 50 MG tablet [id number] Yes Take 1 tablet by mouth twice daily Martinique, Peter M, MD Taking Active   Multiple Vitamin (MULTIVITAMIN) tablet [id number] Yes Take 1 tablet by mouth daily. [provider] Taking Active Self  pantoprazole (PROTONIX) 20 MG tablet [id number] Yes Take 1 tablet (20 mg total) by mouth daily. Nche, Charlene Brooke, NP Taking Active   sildenafil (VIAGRA) 100 MG tablet [id number] No Take 100 mg by mouth daily as needed for erectile dysfunction.  Patient not taking: Reported on 06/22/2021   [provider] Not Taking Active Self  tamsulosin (FLOMAX) 0.4 MG CAPS capsule [id number] Yes Take 0.4 mg by mouth daily after supper.  [provider] Taking Active Self            Patient Active Problem List   Diagnosis Date Noted   DNR (do not resuscitate) discussion 04/24/2021   Essential tremor 04/22/2020   Gastric hemorrhage due to [place]ul[place] lesion of stomach    Thrombocytopenia ([place]) 02/28/2018   Elevated PSA 06/21/2017   Lower extremity edema 09/24/2014   Hypokalemia    Hypomagnesemia    Near syncope 08/27/2014   CKD (chronic kidney disease), stage III ([place]) 08/27/2014   Symptomatic anemia 08/27/2014   Chronic anticoagulation 01/18/2014   Prostate cancer screening 11/06/2010   Squamous cell carcinoma 08/09/2008   ERECTILE DYSFUNCTION 06/25/2008   Essential hypertension 06/25/2008   Chronic atrial fibrillation ([place]) 06/25/2008    Immunization History  Administered Date(s) Administered   Fluad Quad(high Dose 65+) 04/24/2021   Influenza Split 05/13/2012   Influenza  Whole 04/06/2010   Influenza, High Dose Seasonal PF 06/12/2016, 04/18/2017, 04/17/2018, 03/26/2019   Influenza,inj,Quad PF,6+ Mos 05/13/2013, 05/18/2014, 05/20/2015   Influenza-Unspecified 06/19/2018, 04/18/2020   PFIZER(Purple Top)SARS-COV-2 Vaccination 08/26/2019, 09/16/2019   Pneumococcal Conjugate-13 05/18/2014   Pneumococcal Polysaccharide-23 05/20/2006   Zoster, Live 08/09/2008    Conditions to be addressed/monitored:  Hypertension, Atrial Fibrillation, Chronic Kidney Disease, and BPH  Care Plan : General Pharmacy (Adult)  Updates made by [name], RPH since 07/05/2021 12:00 AM     Problem: Hypertension, Atrial Fibrillation, Chronic Kidney Disease, and BPH   Priority: High     Long-Range Goal: Patient-Specific Goal   Start Date: 07/05/2021  Expected End Date: 07/05/2022  This Visit's Progress: On track  Priority: High  Note:   Current Barriers:  No barriers noted  Pharmacist Clinical Goal(s):  Patient will maintain control of blood pressure as evidenced by BP less than 140/90  through collaboration with PharmD and provider.   Interventions: 1:1 collaboration with Nche, Charlene Brooke, NP regarding development and update of comprehensive plan of care as evidenced by provider attestation and co-signature Inter-disciplinary care team collaboration (see longitudinal plan of care) Comprehensive medication review performed; medication list updated in electronic medical record  Hypertension (BP goal <140/90) -Controlled -Current treatment: Benazepril 20 mg twice daily  Diltiazem XR 240 mg daily  Furosemide 20 mg daily as needed  Hydralazine 25 mg three times daily  Metoprolol tartrate 50 mg twice daily  -Medications previously tried: NA  -Current home readings: Does not routinely monitor at home. -Current dietary habits: Cut back on food. Does not salt foods. Decaf coffee.  -Current exercise habits: Stays on the move around the house. Two story house,  ayrodwork.  -Denies hypotensive/hypertensive symptoms -Recommended to continue current medication  Atrial Fibrillation (Goal: prevent stroke and major bleeding) -Controlled -Current treatment: Rate control:  Diltiazem XR 240 mg daily Metoprolol tartrate 50 mg twice daily   Anticoagulation: Dabigatran 150 mg twice daily  -Medications previously tried: NA -Recommended to continue current medication  History of GI Bleed (Goal: Prevent GI Bleed) -Controlled -History of upper GI bleed July 2019 -Current treatment  Pantoprazole 20 mg daily  -Medications previously tried: NA  -Recommended to continue current medication  BPH (Goal: Improve urinary symptoms) -Controlled -Current treatment  Tamsulosin 0.4 mg daily  -Medications previously tried: NA  -Recommended to continue current medication  Chronic Kidney Disease Stage 3a  -All medications assessed for renal dosing and appropriateness in chronic kidney disease. -Recommended to continue current medication   Patient Goals/Self-Care Activities Patient will:  - check blood pressure weekly, document, and provide at future appointments  Follow Up Plan: Telephone follow up appointment with care management team member scheduled for:  12/21/2021 at 3:00 PM      Medication Assistance: None required.  Patient affirms current coverage meets needs.  Compliance/Adherence/Medication fill history: Care Gaps: Shingrix Vaccine COVID-19 Vaccine   Star-Rating Drugs: Benazepril 20 mg  Patient's preferred pharmacy is:  [facility] ([facility]), [place] [facility] [place] [address] [place] ([place]) [place] [address] Phone: [phone] Fax: [phone]  Uses pill box? Yes Pt endorses 100% compliance  We discussed: Current pharmacy is preferred with insurance plan and patient is satisfied with pharmacy services Patient decided to: Continue current medication management strategy  Care Plan and Follow Up Patient  Decision:  Patient agrees to Care Plan and Follow-up.  Plan: Telephone follow up appointment with care management team member scheduled for:  12/21/2021 at 3:00 PM  Junius Argyle, PharmD, Para March, CPP Clinical Pharmacist Practitioner  [facility] at [facility]  [phone]

## 2021-06-22 NOTE — Patient Instructions (Addendum)
Visit Information It was great speaking with you today!  Please let me know if you have any questions about our visit.   Goals Addressed             This Visit's Progress    Track and Manage My Blood Pressure-Hypertension       Timeframe:  Long-Range Goal Priority:  High Start Date: 07/05/2021                            Expected End Date: 07/05/2022                      Follow Up within 90 days   - check blood pressure weekly    Why is this important?   You won't feel high blood pressure, but it can still hurt your blood vessels.  High blood pressure can cause heart or kidney problems. It can also cause a stroke.  Making lifestyle changes like losing a little weight or eating less salt will help.  Checking your blood pressure at home and at different times of the day can help to control blood pressure.  If the doctor prescribes medicine remember to take it the way the doctor ordered.  Call the office if you cannot afford the medicine or if there are questions about it.     Notes:         Patient Care Plan: General Pharmacy (Adult)     Problem Identified: Hypertension, Atrial Fibrillation, Chronic Kidney Disease, and BPH   Priority: High     Long-Range Goal: Patient-Specific Goal   Start Date: 07/05/2021  Expected End Date: 07/05/2022  This Visit's Progress: On track  Priority: High  Note:   Current Barriers:  No barriers noted  Pharmacist Clinical Goal(s):  Patient will maintain control of blood pressure as evidenced by BP less than 140/90  through collaboration with PharmD and provider.   Interventions: 1:1 collaboration with Nche, Charlene Brooke, NP regarding development and update of comprehensive plan of care as evidenced by provider attestation and co-signature Inter-disciplinary care team collaboration (see longitudinal plan of care) Comprehensive medication review performed; medication list updated in electronic medical record  Hypertension (BP goal  <140/90) -Controlled -Current treatment: Benazepril 20 mg twice daily  Diltiazem XR 240 mg daily  Furosemide 20 mg daily as needed  Hydralazine 25 mg three times daily  Metoprolol tartrate 50 mg twice daily  -Medications previously tried: NA  -Current home readings: Does not routinely monitor at home. -Current dietary habits: Cut back on food. Does not salt foods. Decaf coffee.  -Current exercise habits: Stays on the move around the house. Two story house, ayrodwork.  -Denies hypotensive/hypertensive symptoms -Recommended to continue current medication  Atrial Fibrillation (Goal: prevent stroke and major bleeding) -Controlled -Current treatment: Rate control:  Diltiazem XR 240 mg daily Metoprolol tartrate 50 mg twice daily   Anticoagulation: Dabigatran 150 mg twice daily  -Medications previously tried: NA -Recommended to continue current medication  History of GI Bleed (Goal: Prevent GI Bleed) -Controlled -History of upper GI bleed July 2019 -Current treatment  Pantoprazole 20 mg daily  -Medications previously tried: NA  -Recommended to continue current medication  BPH (Goal: Improve urinary symptoms) -Controlled -Current treatment  Tamsulosin 0.4 mg daily  -Medications previously tried: NA  -Recommended to continue current medication  Chronic Kidney Disease Stage 3a  -All medications assessed for renal dosing and appropriateness in chronic kidney disease. -Recommended to  continue current medication   Patient Goals/Self-Care Activities Patient will:  - check blood pressure weekly, document, and provide at future appointments  Follow Up Plan: Telephone follow up appointment with care management team member scheduled for:  12/21/2021 at 3:00 PM      Patient agreed to services and verbal consent obtained.   Patient verbalizes understanding of instructions provided today and agrees to view in [place].   Junius Argyle, PharmD, Para March, CPP Clinical Pharmacist  Practitioner  [facility] at [facility]  [phone]

## 2021-06-26 NOTE — Telephone Encounter (Signed)
Pt called requesting  Pantoprazole Sodium 20 MG

## 2021-07-05 DIAGNOSIS — I10 Essential (primary) hypertension: Secondary | ICD-10-CM | POA: Diagnosis not present

## 2021-07-05 DIAGNOSIS — I482 Chronic atrial fibrillation, unspecified: Secondary | ICD-10-CM | POA: Diagnosis not present

## 2021-07-21 ENCOUNTER — Other Ambulatory Visit: Payer: Self-pay | Admitting: Cardiology

## 2021-07-26 ENCOUNTER — Other Ambulatory Visit: Payer: Self-pay | Admitting: Cardiology

## 2021-08-02 ENCOUNTER — Telehealth: Payer: Self-pay | Admitting: Cardiology

## 2021-08-02 NOTE — Telephone Encounter (Signed)
Patient called in to say that he has papers ready for the nurse to fill out. He is going to drop them by the office today. Please advise

## 2021-08-02 NOTE — Telephone Encounter (Signed)
Let patient know he can drop off papers at our office.

## 2021-08-10 ENCOUNTER — Other Ambulatory Visit: Payer: Self-pay

## 2021-08-10 MED ORDER — DABIGATRAN ETEXILATE MESYLATE 150 MG PO CAPS: 150.0000 mg | ORAL_CAPSULE | Freq: Two times a day (BID) | ORAL | 3 refills | Status: DC

## 2021-08-24 ENCOUNTER — Telehealth: Payer: Self-pay

## 2021-08-24 NOTE — Progress Notes (Signed)
Chronic Care Management Pharmacy Assistant   Name: [name]  MRN: [id number] DOB: Oct 03, 1940  Reason for Encounter:Hypertension Disease State Call.   Recent office visits:  No recent office visit  Recent consult visits:  No recent consult visit  Hospital visits:  None in previous 6 months  Medications: Outpatient Encounter Medications as of 08/24/2021  Medication Sig   pantoprazole (PROTONIX) 20 MG tablet Take 1 tablet by mouth once daily   Acetaminophen (TYLENOL ARTHRITIS PAIN PO) Take 650 mg by mouth every 8 (eight) hours as needed.   benazepril (LOTENSIN) 20 MG tablet Take 1 tablet by mouth twice daily   Cranberry 500 MG CAPS Take 500 mg by mouth daily.   dabigatran (PRADAXA) 150 MG CAPS capsule Take 1 capsule (150 mg total) by mouth 2 (two) times daily.   diltiazem (DILT-XR) 240 MG 24 hr capsule Take 1 capsule by mouth once daily   furosemide (LASIX) 20 MG tablet Take 20 mg daily if needed for swelling (Patient not taking: Reported on 02/63/7858)   Garlic 8502 MG CAPS Take 1 capsule by mouth daily.   hydrALAZINE (APRESOLINE) 25 MG tablet TAKE 1 TABLET BY MOUTH THREE TIMES DAILY   MAGNESIUM PO Take 250 mg by mouth daily.   metoprolol tartrate (LOPRESSOR) 50 MG tablet Take 1 tablet by mouth twice daily   Multiple Vitamin (MULTIVITAMIN) tablet Take 1 tablet by mouth daily.   potassium chloride SA (KLOR-CON M20) 20 MEQ tablet TAKE 1 TABLET BY MOUTH ONCE DAILY . APPOINTMENT REQUIRED FOR FUTURE REFILLS   sildenafil (VIAGRA) 100 MG tablet Take 100 mg by mouth daily as needed for erectile dysfunction. (Patient not taking: Reported on 06/22/2021)   tamsulosin (FLOMAX) 0.4 MG CAPS capsule Take 0.4 mg by mouth daily after supper.    No facility-administered encounter medications on file as of 08/24/2021.    Care Gaps: Shingrix Vaccine COVID-19 Vaccine  Star Rating Drugs: Benazepril 20 mg last filled 06/20/2021 90 day supply at [facility]. Medication Fill Gaps: None  ID  Reviewed chart prior to disease state call. Spoke with patient regarding BP  Recent Office Vitals: BP Readings from Last 3 Encounters:  04/24/21 138/86  12/05/20 (!) 142/80  04/22/20 128/84   Pulse Readings from Last 3 Encounters:  04/24/21 86  12/05/20 66  04/22/20 78    Wt Readings from Last 3 Encounters:  04/24/21 207 lb 6.4 oz (94.1 kg)  12/27/20 209 lb (94.8 kg)  12/05/20 209 lb (94.8 kg)     Kidney Function Lab Results  Component Value Date/Time   CREATININE 1.21 04/24/2021 10:05 AM   CREATININE 1.26 04/22/2020 02:54 PM   CREATININE 1.30 (H) 01/30/2019 11:06 AM   CREATININE 1.10 09/23/2014 12:29 PM   GFR 56.24 (L) 04/24/2021 10:05 AM   GFRNONAA 54 (L) 04/22/2020 02:54 PM   GFRAA 62 04/22/2020 02:54 PM    BMP Latest Ref Rng & Units 04/24/2021 04/22/2020 01/30/2019  Glucose 70 - 99 mg/dL 96 96 96  BUN 6 - 23 mg/dL 25(H) 32(H) 27(H)  Creatinine 0.40 - 1.50 mg/dL 1.21 1.26 1.30(H)  BUN/Creat Ratio 10 - 24 - 25(H) 21  Sodium 135 - 145 mEq/L 142 145(H) 142  Potassium 3.5 - 5.1 mEq/L 4.1 4.9 4.7  Chloride 96 - 112 mEq/L 106 107(H) 107  CO2 19 - 32 mEq/L 25 23 26   Calcium 8.4 - 10.5 mg/dL 9.7 9.6 9.7    Current antihypertensive regimen:  Benazepril 20 mg twice daily  Diltiazem XR  240 mg daily  Furosemide 20 mg daily as needed  Hydralazine 25 mg three times daily  Metoprolol tartrate 50 mg twice daily   Patient denies any issue/sides effects from current antihypertensive regimen. Patient denies headaches,dizziness or lightheadedness.  How often are you checking your Blood Pressure? infrequently  Current home BP readings:  Patient states he is unsure of what his blood pressure readings are because he does not check his blood pressure as often as he should.Patient states his blood pressure has been doing "good". I ask the patient if he could start writing down his readings when he does check his blood pressure, so the providers can see how his blood pressure is  doing at home.Patient states he would try to remember to do that, but he may forget.  What recent interventions/DTPs have been made by any provider to improve Blood Pressure control since last CPP Visit: None ID  Any recent hospitalizations or ED visits since last visit with CPP? No  What diet changes have been made to improve Blood Pressure Control?  Patient states he does limit his salt intake, and he is working on a healthier diet.   What exercise is being done to improve your Blood Pressure Control?  Patient reports being active through out the day by doing a lot of yard work, and house work as in washing clothes,vacuuming,mopping, and sweeping .  Patient denies any questions/concerns for the clinical pharmacist at this time.Patient is aware he can return my call if he does have questions/concerns.  Adherence Review: Is the patient currently on ACE/ARB medication? Yes Does the patient have >5 day gap between last estimated fill dates? No  Telephone follow up appointment with care management team member scheduled for:  12/21/2021 at 3:00 PM  [place] Pharmacist Assistant [phone]

## 2021-09-07 ENCOUNTER — Telehealth: Payer: Self-pay | Admitting: Cardiology

## 2021-09-07 NOTE — Telephone Encounter (Signed)
Spoke to patient . Patient was calling to see about patient assistance for Pradaxa  RN informed patient that Pradaxa has gone generic. There is no longer patient assistance for this medication. Patient is aware and will call pharamcy to see how much medication is now. If cost prohibtive will contact office

## 2021-09-07 NOTE — Telephone Encounter (Signed)
Patient would like to speak to [facility] about some pharmaceutical forms that were being filled out for him.

## 2021-09-14 ENCOUNTER — Other Ambulatory Visit: Payer: Self-pay

## 2021-09-14 ENCOUNTER — Telehealth: Payer: Self-pay | Admitting: Cardiology

## 2021-09-14 DIAGNOSIS — D225 Melanocytic nevi of trunk: Secondary | ICD-10-CM | POA: Diagnosis not present

## 2021-09-14 DIAGNOSIS — D692 Other nonthrombocytopenic purpura: Secondary | ICD-10-CM | POA: Diagnosis not present

## 2021-09-14 DIAGNOSIS — Z85828 Personal history of other malignant neoplasm of skin: Secondary | ICD-10-CM | POA: Diagnosis not present

## 2021-09-14 DIAGNOSIS — L570 Actinic keratosis: Secondary | ICD-10-CM | POA: Diagnosis not present

## 2021-09-14 MED ORDER — DABIGATRAN ETEXILATE MESYLATE 150 MG PO CAPS: 150.0000 mg | ORAL_CAPSULE | Freq: Two times a day (BID) | ORAL | 3 refills | Status: DC

## 2021-09-14 NOTE — Telephone Encounter (Signed)
°*  STAT* If patient is at the pharmacy, call can be transferred to refill team.   1. Which medications need to be refilled? (please list name of each medication and dose if known)  new prescription for generic Pradaxa  2. Which pharmacy/location (including street and city if local pharmacy) is medication to be sent to? [place]  3. Do they need a 30 day or 90 day supply? 90 days and refills

## 2021-09-20 MED ORDER — DABIGATRAN ETEXILATE MESYLATE 150 MG PO CAPS: 150.0000 mg | ORAL_CAPSULE | Freq: Two times a day (BID) | ORAL | 3 refills | Status: DC

## 2021-09-20 NOTE — Telephone Encounter (Signed)
°*  STAT* If patient is at the pharmacy, call can be transferred to refill team.   1. Which medications need to be refilled? (please list name of each medication and dose if known)  dabigatran (PRADAXA) 150 MG CAPS capsule  2. Which pharmacy/location (including street and city if local pharmacy) is medication to be sent to? [place] ([place]), [place] - [address]  3. Do they need a 30 day or 90 day supply?   90 day supply  Pharmacy never received Rx.

## 2021-09-20 NOTE — Telephone Encounter (Signed)
Refill sent to pharmacy.   

## 2021-09-21 ENCOUNTER — Telehealth: Payer: Self-pay

## 2021-09-21 NOTE — Telephone Encounter (Signed)
**Note De-Identified [place]u Obfuscation** Pradaxa PA started through covermymeds. Key: [id number]

## 2021-09-21 NOTE — Telephone Encounter (Signed)
**Note De-Identified Issaih Kaus Obfuscation** Luther Bradley Key: [id number] - PA Case ID: [id number] - Rx #: [id number] Outcome: Approved today PRADAXA CAP 150MG  is approved through 08/05/2022. Your patient may now fill this prescription and it will be covered. Drug Pradaxa 150MG  capsules Form: [facility] Medicare Part D Electronic Prior Authorization Form   I have notified [place] ([place]), [place] of this approval.

## 2021-09-26 ENCOUNTER — Other Ambulatory Visit: Payer: Self-pay | Admitting: Cardiology

## 2021-10-02 ENCOUNTER — Other Ambulatory Visit: Payer: Self-pay

## 2021-10-02 DIAGNOSIS — Z7901 Long term (current) use of anticoagulants: Secondary | ICD-10-CM

## 2021-10-02 MED ORDER — PANTOPRAZOLE SODIUM 20 MG PO TBEC: 20.0000 mg | DELAYED_RELEASE_TABLET | Freq: Every day | ORAL | 3 refills | Status: DC

## 2021-10-02 NOTE — Addendum Note (Signed)
Addended by: Renette Butters on: 10/02/2021 12:48 PM   Modules accepted: Orders

## 2021-10-02 NOTE — Telephone Encounter (Signed)
Rx request came in from [facility], confirmed with patient that he had switched to this pharmacy, updated chart and sent in Rx.  Chart supports Rx Last seen 04/2021 Next OV 04/2022

## 2021-10-06 ENCOUNTER — Telehealth: Payer: Self-pay

## 2021-10-06 NOTE — Progress Notes (Signed)
? ? ?  Chronic Care Management ?Pharmacy Assistant  ? ?Name: [name]  MRN: [id number] DOB: 1941-07-13 ? ?Reason for Encounter: Medication Review/ General Adherence Call. ? ?Recent office visits:  ?No recent office visit ? ?Recent consult visits:  ?No recent consult visit ? ?Hospital visits:  ?None in previous 6 months ? ?Medications: ?Outpatient Encounter Medications as of 10/06/2021  ?Medication Sig  ? Acetaminophen (TYLENOL ARTHRITIS PAIN PO) Take 650 mg by mouth every 8 (eight) hours as needed.  ? benazepril (LOTENSIN) 20 MG tablet Take 1 tablet by mouth twice daily  ? Cranberry 500 MG CAPS Take 500 mg by mouth daily.  ? dabigatran (PRADAXA) 150 MG CAPS capsule Take 1 capsule (150 mg total) by mouth 2 (two) times daily.  ? diltiazem (DILT-XR) 240 MG 24 hr capsule Take 1 capsule by mouth once daily  ? furosemide (LASIX) 20 MG tablet Take 20 mg daily if needed for swelling (Patient not taking: Reported on 06/22/2021)  ? Garlic 7867 MG CAPS Take 1 capsule by mouth daily.  ? hydrALAZINE (APRESOLINE) 25 MG tablet TAKE 1 TABLET BY MOUTH THREE TIMES DAILY  ? MAGNESIUM PO Take 250 mg by mouth daily.  ? metoprolol tartrate (LOPRESSOR) 50 MG tablet Take 1 tablet by mouth twice daily  ? Multiple Vitamin (MULTIVITAMIN) tablet Take 1 tablet by mouth daily.  ? pantoprazole (PROTONIX) 20 MG tablet Take 1 tablet (20 mg total) by mouth daily.  ? potassium chloride SA (KLOR-CON M20) 20 MEQ tablet TAKE 1 TABLET BY MOUTH ONCE DAILY . APPOINTMENT REQUIRED FOR FUTURE REFILLS  ? sildenafil (VIAGRA) 100 MG tablet Take 100 mg by mouth daily as needed for erectile dysfunction. (Patient not taking: Reported on 06/22/2021)  ? tamsulosin (FLOMAX) 0.4 MG CAPS capsule Take 0.4 mg by mouth daily after supper.   ? ?No facility-administered encounter medications on file as of 10/06/2021.  ? ? ?Care Gaps: ?Shingrix Vaccine ?COVID-19 Vaccine  ? ?Star Rating Drugs: ?Benazepril 20 mg last filled 09/22/2021 90 day supply at [facility]. ? ?Medication Fill Gaps: ?None ID ? ?Called patient and discussed medication adherence  with patient, no issues at this time with current medication. ? ? Patient wife Denies ED visit since his last CPP follow up.  ?Patient wife  Denies  any side effects with his medication. ?Patient wife Denies  any problems with hiscurrent pharmacy ? ?Per Clinical pharmacist, please reschedule patient appointment in May to June. ? ?Telephone follow up appointment with Care management team member was rescheduled for : 01/04/2022  at 11:00 am. ? ?Anderson Malta ?Clinical Pharmacist Assistant ?[phone]  ? ?

## 2021-10-26 ENCOUNTER — Telehealth: Payer: Self-pay | Admitting: Cardiology

## 2021-10-26 MED ORDER — HYDRALAZINE HCL 25 MG PO TABS: 25.0000 mg | ORAL_TABLET | Freq: Three times a day (TID) | ORAL | 1 refills | Status: DC

## 2021-10-26 NOTE — Telephone Encounter (Signed)
Refill sent to pharmacy.   

## 2021-10-26 NOTE — Telephone Encounter (Signed)
?*  STAT* If patient is at the pharmacy, call can be transferred to refill team. ? ? ?1. Which medications need to be refilled? (please list name of each medication and dose if known) new prescription, changing pharmacy-Hydralazine ? ?2. Which pharmacy/location (including street and city if local pharmacy) is medication to be sent to?[facility] ? ?3. Do they need a 30 day or 90 day supply? 90 days and refills ? ?

## 2021-11-24 ENCOUNTER — Other Ambulatory Visit: Payer: Self-pay | Admitting: Cardiology

## 2021-11-24 DIAGNOSIS — I10 Essential (primary) hypertension: Secondary | ICD-10-CM

## 2021-12-03 NOTE — Progress Notes (Signed)
? ?[name] ?Date of Birth: 02/04/1941 ? ? ?History of Present Illness: ?[name] is seen today for followup of  atrial fibrillation and HTN. He has a history of chronic atrial fibrillation. He failed medical therapy with Sotalol so he has been managed with rate control and anticoagulation with Pradaxa.  ? ?He did have an upper GI bleed in July 2019. Was transfused one unit PRBC. Upper EGD showed a Dieulafoy lesion in the stomach. His pradaxa was later resumed and he has no further bleeding.  ? ?On follow up today he feels great. He denies any chest pain, SOB, palpitations, dizziness, or edema. Energy level is good. He stays active. No bleeding problems.  ? ?Current Outpatient Medications on File Prior to Visit  ?Medication Sig Dispense Refill  ? Acetaminophen (TYLENOL ARTHRITIS PAIN PO) Take 650 mg by mouth every 8 (eight) hours as needed.    ? benazepril (LOTENSIN) 20 MG tablet Take 1 tablet by mouth twice daily 180 tablet 0  ? Cranberry 500 MG CAPS Take 500 mg by mouth daily.    ? dabigatran (PRADAXA) 150 MG CAPS capsule Take 1 capsule (150 mg total) by mouth 2 (two) times daily. 180 capsule 3  ? DILT-XR 240 MG 24 hr capsule TAKE 1 CAPSULE BY MOUTH ONCE  DAILY 100 capsule 2  ? Garlic 5366 MG CAPS Take 1 capsule by mouth daily.    ? hydrALAZINE (APRESOLINE) 25 MG tablet TAKE 1 TABLET BY MOUTH 3 TIMES  DAILY 270 tablet 1  ? MAGNESIUM PO Take 250 mg by mouth daily.    ? metoprolol tartrate (LOPRESSOR) 50 MG tablet Take 1 tablet by mouth twice daily 180 tablet 3  ? Multiple Vitamin (MULTIVITAMIN) tablet Take 1 tablet by mouth daily.    ? pantoprazole (PROTONIX) 20 MG tablet Take 1 tablet (20 mg total) by mouth daily. 90 tablet 3  ? potassium chloride SA (KLOR-CON M20) 20 MEQ tablet TAKE 1 TABLET BY MOUTH ONCE DAILY . APPOINTMENT REQUIRED FOR FUTURE REFILLS 90 tablet 3  ? tamsulosin (FLOMAX) 0.4 MG CAPS capsule Take 0.4 mg by mouth daily after supper.   0  ? furosemide (LASIX) 20 MG tablet Take 20 mg daily if  needed for swelling (Patient not taking: Reported on 06/22/2021) 90 tablet 3  ? sildenafil (VIAGRA) 100 MG tablet Take 100 mg by mouth daily as needed for erectile dysfunction. (Patient not taking: Reported on 06/22/2021)    ? ?No current facility-administered medications on file prior to visit.  ? ? ?Allergies  ?Allergen Reactions  ? Hctz [Hydrochlorothiazide] Photosensitivity  ? Cipro Iv [Ciprofloxacin] Hives and Rash  ?  Caused red streaks up the arm  ? ? ?Past Medical History:  ?Diagnosis Date  ? Acute blood loss anemia 03/01/2018  ? Acute GI bleeding 02/28/2018  ? Basal cell carcinoma   ? Chronic atrial fibrillation (HCC)   ? Chronic dermatitis   ? CKD (chronic kidney disease), stage III ([place])   ? a. Probable based on historical Cr. H/o ARF. Previously saw Dr. Florene Glen.  ? Erectile dysfunction   ? GI bleed   ? Hypertension   ? Hypokalemia   ? Meningioma ([place])   ? Probable 13 x 12 mm meningioma in right frontal region  ? Thrombocytopenia ([place])   ? improved  ? ? ?Past Surgical History:  ?Procedure Laterality Date  ? ESOPHAGOGASTRODUODENOSCOPY (EGD) WITH PROPOFOL N/A 03/01/2018  ? Procedure: ESOPHAGOGASTRODUODENOSCOPY (EGD) WITH PROPOFOL;  Surgeon: Doran Stabler, MD;  Location:  [facility];  Service: Gastroenterology;  Laterality: N/A;  ? EYE SURGERY    ? spot removed from cornea  ? SCHLEROTHERAPY  03/01/2018  ? Procedure: SCHLEROTHERAPY OF VARICES;  Surgeon: Doran Stabler, MD;  Location: [facility];  Service: Gastroenterology;;  epinephrine 1:10,000 injection / hemostasis clips  ? TEE WITH CARDIOVERSION  12/14/2004  ? Successful TEE guided electrical cardioversion. -- showed mild mitral insufficiency and trace pulmonic insufficiency   ? TOENAIL EXCISION    ? ingrown toenail removal  ? TONSILLECTOMY    ? ? ?Social History  ? ?Tobacco Use  ?Smoking Status Former  ? Packs/day: 1.00  ? Years: 10.00  ? Pack years: 10.00  ? Types: Cigarettes  ? Quit date: 04/25/1970  ? Years since quitting: 51.6  ?Smokeless  Tobacco Never  ? ? ?Social History  ? ?Substance and Sexual Activity  ?Alcohol Use No  ? ? ?Family History  ?Problem Relation Age of Onset  ? Heart disease Mother   ?     with pacemaker  ? Diabetes Mother   ? Asthma Mother   ? Heart failure Mother   ? Bladder Cancer Mother   ? Hypertension Father   ? Pancreatic cancer Father   ? Diabetes Brother   ? Hypertension Sister   ? Diabetes Sister   ? ? ?Review of Systems: ?As noted in history of present illness.  All other systems were reviewed and are negative. ? ?Physical Exam: ?BP 135/60   Pulse 76   Ht '6\' 2"'$  (1.88 m)   Wt 206 lb 12.8 oz (93.8 kg)   SpO2 96%   BMI 26.55 kg/m?  ?GENERAL:  Well appearing ?HEENT:  PERRL, EOMI, sclera are clear. Oropharynx is clear. ?NECK:  No jugular venous distention, carotid upstroke brisk and symmetric, no bruits, no thyromegaly or adenopathy ?LUNGS:  Clear to auscultation bilaterally ?CHEST:  Unremarkable ?HEART:  IRRR,  PMI not displaced or sustained,S1 and S2 within normal limits, no S3, no S4: no clicks, no rubs, no murmurs ?ABD:  Soft, nontender. BS +, no masses or bruits. No hepatomegaly, no splenomegaly ?EXT:  2 + pulses throughout, no edema, no cyanosis no clubbing ?SKIN:  Warm and dry.  No rashes ?NEURO:  Alert and oriented x 3. Cranial nerves II through XII intact. ?PSYCH:  Cognitively intact ? ? ? ?LABORATORY DATA: ?Lab Results  ?Component Value Date  ? WBC 6.2 04/24/2021  ? HGB 14.2 04/24/2021  ? HCT 42.7 04/24/2021  ? PLT 158.0 04/24/2021  ? GLUCOSE 96 04/24/2021  ? CHOL 109 04/24/2021  ? TRIG 81.0 04/24/2021  ? HDL 46.00 04/24/2021  ? [place] 47 04/24/2021  ? ALT 14 04/24/2021  ? AST 18 04/24/2021  ? NA 142 04/24/2021  ? K 4.1 04/24/2021  ? CL 106 04/24/2021  ? CREATININE 1.21 04/24/2021  ? BUN 25 (H) 04/24/2021  ? CO2 25 04/24/2021  ? TSH 1.400 11/19/2019  ? PSA 4.03 (H) 04/24/2021  ? INR 1.55 03/01/2018  ? ? ?Ecg today shows AFib with rate 76. Otherwise normal.nonspecific ST abnormality. I have personally reviewed  and interpreted this study. ? ? ? ? ?Assessment / Plan: ?1. Atrial fibrillation.  permanent. He is asymptomatic. Rate is well controlled. Continue Pradaxa.  We will continue a long-term strategy of rate control and anticoagulation.  ? ?2. Hypertension. Blood pressure is well controlled.  ? ? ? ?I will follow up in 1 year.  ?

## 2021-12-05 ENCOUNTER — Other Ambulatory Visit: Payer: Self-pay | Admitting: Cardiology

## 2021-12-11 ENCOUNTER — Telehealth: Payer: Self-pay | Admitting: Nurse Practitioner

## 2021-12-11 NOTE — Telephone Encounter (Signed)
See note

## 2021-12-11 NOTE — Telephone Encounter (Signed)
Appointment scheduled for next available 12/28/21 ?

## 2021-12-15 ENCOUNTER — Encounter: Payer: Self-pay | Admitting: Cardiology

## 2021-12-15 ENCOUNTER — Ambulatory Visit (INDEPENDENT_AMBULATORY_CARE_PROVIDER_SITE_OTHER): Payer: Medicare Other | Admitting: Cardiology

## 2021-12-15 VITALS — BP 135/60 | HR 76 | Ht 74.0 in | Wt 206.8 lb

## 2021-12-15 DIAGNOSIS — I482 Chronic atrial fibrillation, unspecified: Secondary | ICD-10-CM

## 2021-12-15 DIAGNOSIS — I10 Essential (primary) hypertension: Secondary | ICD-10-CM | POA: Diagnosis not present

## 2021-12-15 DIAGNOSIS — Z7901 Long term (current) use of anticoagulants: Secondary | ICD-10-CM

## 2021-12-21 ENCOUNTER — Telehealth: Payer: Medicare Other

## 2021-12-28 ENCOUNTER — Ambulatory Visit: Payer: Medicare Other | Admitting: Nurse Practitioner

## 2021-12-29 ENCOUNTER — Telehealth: Payer: Self-pay | Admitting: Cardiology

## 2021-12-29 DIAGNOSIS — I482 Chronic atrial fibrillation, unspecified: Secondary | ICD-10-CM

## 2021-12-29 NOTE — Telephone Encounter (Signed)
*  STAT* If patient is at the pharmacy, call can be transferred to refill team.   1. Which medications need to be refilled? (please list name of each medication and dose if known) metoprolol tartrate (LOPRESSOR) 50 MG tablet  2. Which pharmacy/location (including street and city if local pharmacy) is medication to be sent to?  [facility] ([facility] - [place], [place]  3. Do they need a 30 day or 90 day supply?  90   Pt states that they only have 10 days left of this medication.

## 2022-01-02 ENCOUNTER — Ambulatory Visit: Payer: Medicare Other

## 2022-01-03 MED ORDER — METOPROLOL TARTRATE 50 MG PO TABS: 50.0000 mg | ORAL_TABLET | Freq: Two times a day (BID) | ORAL | 3 refills | Status: DC

## 2022-01-03 NOTE — Telephone Encounter (Signed)
Refills has been sent to pharmacy. 

## 2022-01-04 ENCOUNTER — Telehealth: Payer: Medicare Other

## 2022-01-17 ENCOUNTER — Other Ambulatory Visit: Payer: Self-pay | Admitting: Cardiology

## 2022-01-23 ENCOUNTER — Ambulatory Visit: Payer: Medicare Other

## 2022-01-31 ENCOUNTER — Ambulatory Visit: Payer: Medicare Other

## 2022-02-07 ENCOUNTER — Ambulatory Visit (INDEPENDENT_AMBULATORY_CARE_PROVIDER_SITE_OTHER): Payer: Medicare Other

## 2022-02-07 DIAGNOSIS — Z0000 Encounter for general adult medical examination without abnormal findings: Secondary | ICD-10-CM | POA: Diagnosis not present

## 2022-02-07 NOTE — Patient Instructions (Signed)
[name] , Thank you for taking time to come for your Medicare Wellness Visit. I appreciate your ongoing commitment to your health goals. Please review the following plan we discussed and let me know if I can assist you in the future.   Screening recommendations/referrals: Colonoscopy: no longer required  Recommended yearly ophthalmology/optometry visit for glaucoma screening and checkup Recommended yearly dental visit for hygiene and checkup  Vaccinations: Influenza vaccine: completed  Pneumococcal vaccine: completed  Tdap vaccine: due  Shingles vaccine: will consider     Advanced directives: none   Conditions/risks identified: none   Next appointment: none   Preventive Care 82 Years and Older, Male Preventive care refers to lifestyle choices and visits with your health care provider that can promote health and wellness. What does preventive care include? A yearly physical exam. This is also called an annual well check. Dental exams once or twice a year. Routine eye exams. Ask your health care provider how often you should have your eyes checked. Personal lifestyle choices, including: Daily care of your teeth and gums. Regular physical activity. Eating a healthy diet. Avoiding tobacco and drug use. Limiting alcohol use. Practicing safe sex. Taking low doses of aspirin every day. Taking vitamin and mineral supplements as recommended by your health care provider. What happens during an annual well check? The services and screenings done by your health care provider during your annual well check will depend on your age, overall health, lifestyle risk factors, and family history of disease. Counseling  Your health care provider may ask you questions about your: Alcohol use. Tobacco use. Drug use. Emotional well-being. Home and relationship well-being. Sexual activity. Eating habits. History of falls. Memory and ability to understand (cognition). Work and work  Statistician. Screening  You may have the following tests or measurements: Height, weight, and BMI. Blood pressure. Lipid and cholesterol levels. These may be checked every 5 years, or more frequently if you are over 82 years old. Skin check. Lung cancer screening. You may have this screening every year starting at age 82 if you have a 30-pack-year history of smoking and currently smoke or have quit within the past 15 years. Fecal occult blood test (FOBT) of the stool. You may have this test every year starting at age 30. Flexible sigmoidoscopy or colonoscopy. You may have a sigmoidoscopy every 5 years or a colonoscopy every 10 years starting at age 82. Prostate cancer screening. Recommendations will vary depending on your family history and other risks. Hepatitis C blood test. Hepatitis B blood test. Sexually transmitted disease (STD) testing. Diabetes screening. This is done by checking your blood sugar (glucose) after you have not eaten for a while (fasting). You may have this done every 1-3 years. Abdominal aortic aneurysm (AAA) screening. You may need this if you are a current or former smoker. Osteoporosis. You may be screened starting at age 82 if you are at high risk. Talk with your health care provider about your test results, treatment options, and if necessary, the need for more tests. Vaccines  Your health care provider may recommend certain vaccines, such as: Influenza vaccine. This is recommended every year. Tetanus, diphtheria, and acellular pertussis (Tdap, Td) vaccine. You may need a Td booster every 10 years. Zoster vaccine. You may need this after age 82. Pneumococcal 13-valent conjugate (PCV13) vaccine. One dose is recommended after age 82. Pneumococcal polysaccharide (PPSV23) vaccine. One dose is recommended after age 82. Talk to your health care provider about which screenings and vaccines you need  and how often you need them. This information is not intended to replace  advice given to you by your health care provider. Make sure you discuss any questions you have with your health care provider. Document Released: 08/19/2015 Document Revised: 04/11/2016 Document Reviewed: 05/24/2015 Elsevier Interactive Patient Education  2017 [place] Prevention in the Home Falls can cause injuries. They can happen to people of all ages. There are many things you can do to make your home safe and to help prevent falls. What can I do on the outside of my home? Regularly fix the edges of walkways and driveways and fix any cracks. Remove anything that might make you trip as you walk through a door, such as a raised step or threshold. Trim any bushes or trees on the path to your home. Use bright outdoor lighting. Clear any walking paths of anything that might make someone trip, such as rocks or tools. Regularly check to see if handrails are loose or broken. Make sure that both sides of any steps have handrails. Any raised decks and porches should have guardrails on the edges. Have any leaves, snow, or ice cleared regularly. Use sand or salt on walking paths during winter. Clean up any spills in your garage right away. This includes oil or grease spills. What can I do in the bathroom? Use night lights. Install grab bars by the toilet and in the tub and shower. Do not use towel bars as grab bars. Use non-skid mats or decals in the tub or shower. If you need to sit down in the shower, use a plastic, non-slip stool. Keep the floor dry. Clean up any water that spills on the floor as soon as it happens. Remove soap buildup in the tub or shower regularly. Attach bath mats securely with double-sided non-slip rug tape. Do not have throw rugs and other things on the floor that can make you trip. What can I do in the bedroom? Use night lights. Make sure that you have a light by your bed that is easy to reach. Do not use any sheets or blankets that are too big for your bed.  They should not hang down onto the floor. Have a firm chair that has side arms. You can use this for support while you get dressed. Do not have throw rugs and other things on the floor that can make you trip. What can I do in the kitchen? Clean up any spills right away. Avoid walking on wet floors. Keep items that you use a lot in easy-to-reach places. If you need to reach something above you, use a strong step stool that has a grab bar. Keep electrical cords out of the way. Do not use floor polish or wax that makes floors slippery. If you must use wax, use non-skid floor wax. Do not have throw rugs and other things on the floor that can make you trip. What can I do with my stairs? Do not leave any items on the stairs. Make sure that there are handrails on both sides of the stairs and use them. Fix handrails that are broken or loose. Make sure that handrails are as long as the stairways. Check any carpeting to make sure that it is firmly attached to the stairs. Fix any carpet that is loose or worn. Avoid having throw rugs at the top or bottom of the stairs. If you do have throw rugs, attach them to the floor with carpet tape. Make sure that you  have a light switch at the top of the stairs and the bottom of the stairs. If you do not have them, ask someone to add them for you. What else can I do to help prevent falls? Wear shoes that: Do not have high heels. Have rubber bottoms. Are comfortable and fit you well. Are closed at the toe. Do not wear sandals. If you use a stepladder: Make sure that it is fully opened. Do not climb a closed stepladder. Make sure that both sides of the stepladder are locked into place. Ask someone to hold it for you, if possible. Clearly mark and make sure that you can see: Any grab bars or handrails. First and last steps. Where the edge of each step is. Use tools that help you move around (mobility aids) if they are needed. These  include: Canes. Walkers. Scooters. Crutches. Turn on the lights when you go into a dark area. Replace any light bulbs as soon as they burn out. Set up your furniture so you have a clear path. Avoid moving your furniture around. If any of your floors are uneven, fix them. If there are any pets around you, be aware of where they are. Review your medicines with your doctor. Some medicines can make you feel dizzy. This can increase your chance of falling. Ask your doctor what other things that you can do to help prevent falls. This information is not intended to replace advice given to you by your health care provider. Make sure you discuss any questions you have with your health care provider. Document Released: 05/19/2009 Document Revised: 12/29/2015 Document Reviewed: 08/27/2014 Elsevier Interactive Patient Education  2017 [employer].

## 2022-02-07 NOTE — Progress Notes (Signed)
Subjective:   [name] is a 81 y.o. male who presents for an Initial Medicare Annual Wellness Visit.   I connected with [name]  today by telephone and verified that I am speaking with the correct person using two identifiers. Location patient: home Location provider: work Persons participating in the virtual visit: patient, provider.   I discussed the limitations, risks, security and privacy concerns of performing an evaluation and management service by telephone and the availability of in person appointments. I also discussed with the patient that there may be a patient responsible charge related to this service. The patient expressed understanding and verbally consented to this telephonic visit.    Interactive audio and video telecommunications were attempted between this provider and patient, however failed, due to patient having technical difficulties OR patient did not have access to video capability.  We continued and completed visit with audio only.    Review of Systems     Cardiac Risk Factors include: advanced age (>66mn, >>43women);male gender;dyslipidemia     Objective:    Today's Vitals   There is no height or weight on file to calculate BMI.     02/07/2022    1:24 PM 04/24/2021    3:49 PM 12/27/2020   11:43 AM 02/28/2018   10:21 PM 06/21/2017    9:07 AM 06/12/2016    4:38 PM 10/31/2015    5:59 PM  Advanced Directives  Does Patient Have a Medical Advance Directive? No Yes No No No No No  Type of Advance Directive  Out of facility DNR (pink MOST or yellow form);Living will       Does patient want to make changes to medical advance directive?  Yes (MAU/Ambulatory/Procedural Areas - Information given)       Would patient like information on creating a medical advance directive? No - Patient declined  Yes (MAU/Ambulatory/Procedural Areas - Information given) Yes (Inpatient - patient requests chaplain consult to create a medical advance directive) Yes  (MAU/Ambulatory/Procedural Areas - Information given) Yes - Educational materials given   Pre-existing out of facility DNR order (yellow form or pink MOST form)  Yellow form placed in chart (order not valid for inpatient use)         Current Medications (verified) Outpatient Encounter Medications as of 02/07/2022  Medication Sig   Acetaminophen (TYLENOL ARTHRITIS PAIN PO) Take 650 mg by mouth every 8 (eight) hours as needed.   benazepril (LOTENSIN) 20 MG tablet TAKE 1 TABLET BY MOUTH TWICE  DAILY   Cranberry 500 MG CAPS Take 500 mg by mouth daily.   DILT-XR 240 MG 24 hr capsule TAKE 1 CAPSULE BY MOUTH ONCE  DAILY   furosemide (LASIX) 20 MG tablet Take 20 mg daily if needed for swelling   Garlic 12778MG CAPS Take 1 capsule by mouth daily.   hydrALAZINE (APRESOLINE) 25 MG tablet TAKE 1 TABLET BY MOUTH 3 TIMES  DAILY   MAGNESIUM PO Take 250 mg by mouth daily.   metoprolol tartrate (LOPRESSOR) 50 MG tablet Take 1 tablet (50 mg total) by mouth 2 (two) times daily.   Multiple Vitamin (MULTIVITAMIN) tablet Take 1 tablet by mouth daily.   pantoprazole (PROTONIX) 20 MG tablet Take 1 tablet (20 mg total) by mouth daily.   potassium chloride SA (KLOR-CON M20) 20 MEQ tablet TAKE 1 TABLET BY MOUTH ONCE DAILY . APPOINTMENT REQUIRED FOR FUTURE REFILLS   sildenafil (VIAGRA) 100 MG tablet Take 100 mg by mouth daily as needed for erectile  dysfunction.   tamsulosin (FLOMAX) 0.4 MG CAPS capsule Take 0.4 mg by mouth daily after supper.    dabigatran (PRADAXA) 150 MG CAPS capsule Take 1 capsule (150 mg total) by mouth 2 (two) times daily.   No facility-administered encounter medications on file as of 02/07/2022.    Allergies (verified) Hctz [hydrochlorothiazide] and Cipro iv [ciprofloxacin]   History: Past Medical History:  Diagnosis Date   Acute blood loss anemia 03/01/2018   Acute GI bleeding 02/28/2018   Basal cell carcinoma    Chronic atrial fibrillation (HCC)    Chronic dermatitis    CKD (chronic  kidney disease), stage III ([place])    a. Probable based on historical Cr. H/o ARF. Previously saw Dr. Florene Glen.   Erectile dysfunction    GI bleed    Hypertension    Hypokalemia    Meningioma (HCC)    Probable 13 x 12 mm meningioma in right frontal region   Thrombocytopenia ([place])    improved   Past Surgical History:  Procedure Laterality Date   ESOPHAGOGASTRODUODENOSCOPY (EGD) WITH PROPOFOL N/A 03/01/2018   Procedure: ESOPHAGOGASTRODUODENOSCOPY (EGD) WITH PROPOFOL;  Surgeon: Doran Stabler, MD;  Location: [facility];  Service: Gastroenterology;  Laterality: N/A;   EYE SURGERY     spot removed from cornea   SCHLEROTHERAPY  03/01/2018   Procedure: SCHLEROTHERAPY OF VARICES;  Surgeon: Doran Stabler, MD;  Location: [facility];  Service: Gastroenterology;;  epinephrine 1:10,000 injection / hemostasis clips   TEE WITH CARDIOVERSION  12/14/2004   Successful TEE guided electrical cardioversion. -- showed mild mitral insufficiency and trace pulmonic insufficiency    TOENAIL EXCISION     ingrown toenail removal   TONSILLECTOMY     Family History  Problem Relation Age of Onset   Heart disease Mother        with pacemaker   Diabetes Mother    Asthma Mother    Heart failure Mother    Bladder Cancer Mother    Hypertension Father    Pancreatic cancer Father    Diabetes Brother    Hypertension Sister    Diabetes Sister    Social History   Socioeconomic History   Marital status: Married    Spouse name: Not on file   Number of children: 2   Years of education: Not on file   Highest education level: Not on file  Occupational History   Occupation: managed salvage yard    Employer: RETIRED  Tobacco Use   Smoking status: Former    Packs/day: 1.00    Years: 10.00    Total pack years: 10.00    Types: Cigarettes    Quit date: 04/25/1970    Years since quitting: 51.8   Smokeless tobacco: Never  Vaping Use   Vaping Use: Never used  Substance and Sexual Activity   Alcohol use:  No   Drug use: No   Sexual activity: Yes  Other Topics Concern   Not on file  Social History Narrative   Keeps sister who is mentally retarded.   Social Determinants of Health   Financial Resource Strain: Low Risk  (02/07/2022)   Overall Financial Resource Strain (CARDIA)    Difficulty of Paying Living Expenses: Not hard at all  Food Insecurity: No Food Insecurity (02/07/2022)   Hunger Vital Sign    Worried About Running Out of Food in the Last Year: Never true    Ran Out of Food in the Last Year: Never true  Transportation Needs: No Transportation  Needs (02/07/2022)   PRAPARE - Hydrologist (Medical): No    Lack of Transportation (Non-Medical): No  Physical Activity: Insufficiently Active (02/07/2022)   Exercise Vital Sign    Days of Exercise per Week: 3 days    Minutes of Exercise per Session: 30 min  Stress: No Stress Concern Present (02/07/2022)   [place]    Feeling of Stress : Not at all  Social Connections: Moderately Integrated (02/07/2022)   Social Connection and Isolation Panel [NHANES]    Frequency of Communication with Friends and Family: Three times a week    Frequency of Social Gatherings with Friends and Family: Three times a week    Attends Religious Services: More than 4 times per year    Active Member of Clubs or Organizations: No    Attends Archivist Meetings: Never    Marital Status: Married    Tobacco Counseling Counseling given: Not Answered   Clinical Intake:  Pre-visit preparation completed: Yes        Nutritional Risks: None Diabetes: No  How often do you need to have someone help you when you read instructions, pamphlets, or other written materials from your doctor or pharmacy?: 1 - Never What is the last grade level you completed in school?: [place] Needed?: No  Information entered by ::  [facility] of Daily Living    02/07/2022    1:24 PM  In your present state of health, do you have any difficulty performing the following activities:  Hearing? 0  Vision? 0  Difficulty concentrating or making decisions? 0  Walking or climbing stairs? 0  Dressing or bathing? 0  Doing errands, shopping? 0  Preparing Food and eating ? N  Using the Toilet? N  In the past six months, have you accidently leaked urine? N  Do you have problems with loss of bowel control? N  Managing your Medications? N  Managing your Finances? N  Housekeeping or managing your Housekeeping? N    Patient Care Team: Nche, Charlene Brooke, NP as PCP - General (Internal Medicine) Martinique, Peter M, MD as Consulting Physician (Cardiology) Rana Snare, MD (Inactive) as Consulting Physician (Urology) Darleen Crocker, MD as Consulting Physician (Ophthalmology) Rolm Bookbinder, MD as Consulting Physician (Dermatology) Germaine Pomfret, [facility] as Pharmacist (Pharmacist)  Indicate any recent Medical Services you may have received from other than [facility] providers in the past year (date may be approximate).     Assessment:   This is a routine wellness examination for [name].  Hearing/Vision screen Vision Screening - Comments:: Annual eye exams wear glasses   Dietary issues and exercise activities discussed: Current Exercise Habits: Home exercise routine, Type of exercise: walking, Time (Minutes): 30, Frequency (Times/Week): 3, Weekly Exercise (Minutes/Week): 90, Intensity: Mild, Exercise limited by: None identified   Goals Addressed             This Visit's Progress    Patient Stated   On track    Drink more water       Depression Screen    02/07/2022    1:24 PM 02/07/2022    1:23 PM 04/24/2021    9:02 AM 12/27/2020   11:44 AM 01/30/2019    9:13 AM 12/23/2017   10:41 AM 06/21/2017    9:10 AM  PHQ 2/9 Scores  PHQ - 2 Score 0 0 0 0 0 0 0    Fall  Risk    02/07/2022    1:24 PM 04/24/2021     9:02 AM 12/27/2020   11:44 AM 01/30/2019    9:13 AM 06/21/2017    9:10 AM  Fall Risk   Falls in the past year? 0 0 0 0 No  Number falls in past yr: 0 0 0    Injury with Fall? 0 0 0    Risk for fall due to : No Fall Risks No Fall Risks     Follow up Falls evaluation completed;Education provided Falls evaluation completed;Falls prevention discussed;Education provided Falls prevention discussed      FALL RISK PREVENTION PERTAINING TO THE HOME:  Any stairs in or around the home? Yes  If so, are there any without handrails? No  Home free of loose throw rugs in walkways, pet beds, electrical cords, etc? Yes  Adequate lighting in your home to reduce risk of falls? Yes   ASSISTIVE DEVICES UTILIZED TO PREVENT FALLS:  Life alert? No  Use of a cane, walker or w/c? No  Grab bars in the bathroom? Yes  Shower chair or bench in shower? Yes  Elevated toilet seat or a handicapped toilet? Yes    Cognitive Function: Normal cognitive status assessed by telephone conversation by this Nurse Health Advisor. No abnormalities found.      04/24/2021   10:03 AM 06/21/2017    9:11 AM 06/12/2016    4:43 PM 06/12/2016    4:42 PM  MMSE - Mini Mental State Exam  Orientation to time '5 5 5 5  '$ Orientation to Place '5 5 5   '$ Registration '3 3 3   '$ Attention/ Calculation '5 5 5   '$ Recall '3 3 3   '$ Language- name 2 objects '2 2 2   '$ Language- repeat '1 1 1   '$ Language- follow 3 step command '3 3 3   '$ Language- read & follow direction '1 1 1   '$ Write a sentence '1 1 1   '$ Copy design '1 1 1   '$ Total score '30 30 30         '$ Immunizations Immunization History  Administered Date(s) Administered   Fluad Quad(high Dose 65+) 04/24/2021   Influenza Split 05/13/2012   Influenza Whole 04/06/2010   Influenza, High Dose Seasonal PF 06/12/2016, 04/18/2017, 04/17/2018, 03/26/2019   Influenza,inj,Quad PF,6+ Mos 05/13/2013, 05/18/2014, 05/20/2015   Influenza-Unspecified 06/19/2018, 04/18/2020   PFIZER(Purple Top)SARS-COV-2  Vaccination 08/26/2019, 09/16/2019   Pneumococcal Conjugate-13 05/18/2014   Pneumococcal Polysaccharide-23 05/20/2006   Zoster, Live 08/09/2008    TDAP status: Due, Education has been provided regarding the importance of this vaccine. Advised may receive this vaccine at local pharmacy or Health Dept. Aware to provide a copy of the vaccination record if obtained from local pharmacy or Health Dept. Verbalized acceptance and understanding.  Flu Vaccine status: Up to date  Pneumococcal vaccine status: Up to date  Covid-19 vaccine status: Completed vaccines  Qualifies for Shingles Vaccine? Yes   Zostavax completed No   Shingrix Completed?: No.    Education has been provided regarding the importance of this vaccine. Patient has been advised to call insurance company to determine out of pocket expense if they have not yet received this vaccine. Advised may also receive vaccine at local pharmacy or Health Dept. Verbalized acceptance and understanding.  Screening Tests Health Maintenance  Topic Date Due   Zoster Vaccines- Shingrix (1 of 2) Never done   COVID-19 Vaccine (3 - Pfizer risk series) 10/14/2019   INFLUENZA VACCINE  03/06/2022  TETANUS/TDAP  04/25/2026   Pneumonia Vaccine 54+ Years old  Completed   HPV VACCINES  Aged Out    Health Maintenance  Health Maintenance Due  Topic Date Due   Zoster Vaccines- Shingrix (1 of 2) Never done   COVID-19 Vaccine (3 - Pfizer risk series) 10/14/2019    Colorectal cancer screening: No longer required.   Lung Cancer Screening: (Low Dose CT Chest recommended if Age 56-80 years, 30 pack-year currently smoking OR have quit w/in 15years.) does not qualify.   Lung Cancer Screening Referral: n/a  Additional Screening:  Hepatitis C Screening: does not qualify;   Vision Screening: Recommended annual ophthalmology exams for early detection of glaucoma and other disorders of the eye. Is the patient up to date with their annual eye exam?  Yes   Who is the provider or what is the name of the office in which the patient attends annual eye exams? Dr.Smith  If pt is not established with a provider, would they like to be referred to a provider to establish care? No .   Dental Screening: Recommended annual dental exams for proper oral hygiene  Community Resource Referral / Chronic Care Management: CRR required this visit?  No   CCM required this visit?  No      Plan:     I have personally reviewed and noted the following in the patient's chart:   Medical and social history Use of alcohol, tobacco or illicit drugs  Current medications and supplements including opioid prescriptions. Patient is not currently taking opioid prescriptions. Functional ability and status Nutritional status Physical activity Advanced directives List of other physicians Hospitalizations, surgeries, and ER visits in previous 12 months Vitals Screenings to include cognitive, depression, and falls Referrals and appointments  In addition, I have reviewed and discussed with patient certain preventive protocols, quality metrics, and best practice recommendations. A written personalized care plan for preventive services as well as general preventive health recommendations were provided to patient.     Randel Pigg, LPN   08/14/7586   Nurse Notes: none

## 2022-02-22 ENCOUNTER — Other Ambulatory Visit: Payer: Self-pay | Admitting: Cardiology

## 2022-03-17 ENCOUNTER — Other Ambulatory Visit: Payer: Self-pay | Admitting: Cardiology

## 2022-03-19 DIAGNOSIS — D0462 Carcinoma in situ of skin of left upper limb, including shoulder: Secondary | ICD-10-CM | POA: Diagnosis not present

## 2022-03-19 DIAGNOSIS — L821 Other seborrheic keratosis: Secondary | ICD-10-CM | POA: Diagnosis not present

## 2022-03-19 DIAGNOSIS — Z85828 Personal history of other malignant neoplasm of skin: Secondary | ICD-10-CM | POA: Diagnosis not present

## 2022-03-19 DIAGNOSIS — D225 Melanocytic nevi of trunk: Secondary | ICD-10-CM | POA: Diagnosis not present

## 2022-03-19 DIAGNOSIS — I8392 Asymptomatic varicose veins of left lower extremity: Secondary | ICD-10-CM | POA: Diagnosis not present

## 2022-03-19 DIAGNOSIS — L578 Other skin changes due to chronic exposure to nonionizing radiation: Secondary | ICD-10-CM | POA: Diagnosis not present

## 2022-03-19 DIAGNOSIS — I8391 Asymptomatic varicose veins of right lower extremity: Secondary | ICD-10-CM | POA: Diagnosis not present

## 2022-03-19 DIAGNOSIS — L438 Other lichen planus: Secondary | ICD-10-CM | POA: Diagnosis not present

## 2022-03-19 DIAGNOSIS — D3613 Benign neoplasm of peripheral nerves and autonomic nervous system of lower limb, including hip: Secondary | ICD-10-CM | POA: Diagnosis not present

## 2022-03-19 DIAGNOSIS — D485 Neoplasm of uncertain behavior of skin: Secondary | ICD-10-CM | POA: Diagnosis not present

## 2022-03-19 DIAGNOSIS — L570 Actinic keratosis: Secondary | ICD-10-CM | POA: Diagnosis not present

## 2022-03-19 DIAGNOSIS — L723 Sebaceous cyst: Secondary | ICD-10-CM | POA: Diagnosis not present

## 2022-04-25 ENCOUNTER — Encounter: Payer: Self-pay | Admitting: Nurse Practitioner

## 2022-04-25 ENCOUNTER — Ambulatory Visit (INDEPENDENT_AMBULATORY_CARE_PROVIDER_SITE_OTHER): Payer: Medicare Other | Admitting: Nurse Practitioner

## 2022-04-25 VITALS — BP 130/70 | HR 47 | Temp 96.3°F | Ht 74.0 in | Wt 207.8 lb

## 2022-04-25 DIAGNOSIS — Z13220 Encounter for screening for lipoid disorders: Secondary | ICD-10-CM | POA: Diagnosis not present

## 2022-04-25 DIAGNOSIS — G250 Essential tremor: Secondary | ICD-10-CM

## 2022-04-25 DIAGNOSIS — Z136 Encounter for screening for cardiovascular disorders: Secondary | ICD-10-CM

## 2022-04-25 DIAGNOSIS — R0989 Other specified symptoms and signs involving the circulatory and respiratory systems: Secondary | ICD-10-CM | POA: Diagnosis not present

## 2022-04-25 DIAGNOSIS — Z125 Encounter for screening for malignant neoplasm of prostate: Secondary | ICD-10-CM | POA: Diagnosis not present

## 2022-04-25 DIAGNOSIS — Z23 Encounter for immunization: Secondary | ICD-10-CM | POA: Diagnosis not present

## 2022-04-25 DIAGNOSIS — R972 Elevated prostate specific antigen [PSA]: Secondary | ICD-10-CM

## 2022-04-25 DIAGNOSIS — D696 Thrombocytopenia, unspecified: Secondary | ICD-10-CM

## 2022-04-25 DIAGNOSIS — Z0001 Encounter for general adult medical examination with abnormal findings: Secondary | ICD-10-CM

## 2022-04-25 LAB — COMPREHENSIVE METABOLIC PANEL
ALT: 13 U/L (ref 0–53)
AST: 18 U/L (ref 0–37)
Albumin: 4.2 g/dL (ref 3.5–5.2)
Alkaline Phosphatase: 82 U/L (ref 39–117)
BUN: 23 mg/dL (ref 6–23)
CO2: 26 mEq/L (ref 19–32)
Calcium: 9.4 mg/dL (ref 8.4–10.5)
Chloride: 106 mEq/L (ref 96–112)
Creatinine, Ser: 1.25 mg/dL (ref 0.40–1.50)
GFR: 53.71 mL/min — ABNORMAL LOW (ref >60.00)
Glucose, Bld: 94 mg/dL (ref 70–99)
Potassium: 3.9 mEq/L (ref 3.5–5.1)
Sodium: 141 mEq/L (ref 135–145)
Total Bilirubin: 1.4 mg/dL — ABNORMAL HIGH (ref 0.2–1.2)
Total Protein: 6.8 g/dL (ref 6.0–8.3)

## 2022-04-25 LAB — PSA, MEDICARE: PSA: 4.06 ng/ml — ABNORMAL HIGH (ref 0.10–4.00)

## 2022-04-25 LAB — CBC
HCT: 40.9 % (ref 39.0–52.0)
Hemoglobin: 13.7 g/dL (ref 13.0–17.0)
MCHC: 33.5 g/dL (ref 30.0–36.0)
MCV: 94 fl (ref 78.0–100.0)
Platelets: 139 10*3/uL — ABNORMAL LOW (ref 150.0–400.0)
RBC: 4.35 Mil/uL (ref 4.22–5.81)
RDW: 14.5 % (ref 11.5–15.5)
WBC: 6.3 10*3/uL (ref 4.0–10.5)

## 2022-04-25 LAB — LIPID PANEL
Cholesterol: 107 mg/dL (ref 0–200)
HDL: 45.5 mg/dL (ref >39.00)
LDL Cholesterol: 48 mg/dL (ref 0–99)
NonHDL: 61.79
Total CHOL/HDL Ratio: 2
Triglycerides: 70 mg/dL (ref 0.0–149.0)
VLDL: 14 mg/dL (ref 0.0–40.0)

## 2022-04-25 NOTE — Patient Instructions (Addendum)
You will be contacted to schedule appt for arterial doppler  Go to lab  Start daily exercise (walking on treadmil and chair exercise or resistant band)  Preventive Care 65 Years and Older, Male Preventive care refers to lifestyle choices and visits with your health care provider that can promote health and wellness. Preventive care visits are also called wellness exams. What can I expect for my preventive care visit? Counseling During your preventive care visit, your health care provider may ask about your: Medical history, including: Past medical problems. Family medical history. History of falls. Current health, including: Emotional well-being. Home life and relationship well-being. Sexual activity. Memory and ability to understand (cognition). Lifestyle, including: Alcohol, nicotine or tobacco, and drug use. Access to firearms. Diet, exercise, and sleep habits. Work and work Statistician. Sunscreen use. Safety issues such as seatbelt and bike helmet use. Physical exam Your health care provider will check your: Height and weight. These may be used to calculate your BMI (body mass index). BMI is a measurement that tells if you are at a healthy weight. Waist circumference. This measures the distance around your waistline. This measurement also tells if you are at a healthy weight and may help predict your risk of certain diseases, such as type 2 diabetes and high blood pressure. Heart rate and blood pressure. Body temperature. Skin for abnormal spots. What immunizations do I need?  Vaccines are usually given at various ages, according to a schedule. Your health care provider will recommend vaccines for you based on your age, medical history, and lifestyle or other factors, such as travel or where you work. What tests do I need? Screening Your health care provider may recommend screening tests for certain conditions. This may include: Lipid and cholesterol levels. Diabetes  screening. This is done by checking your blood sugar (glucose) after you have not eaten for a while (fasting). Hepatitis C test. Hepatitis B test. HIV (human immunodeficiency virus) test. STI (sexually transmitted infection) testing, if you are at risk. Lung cancer screening. Colorectal cancer screening. Prostate cancer screening. Abdominal aortic aneurysm (AAA) screening. You may need this if you are a current or former smoker. Talk with your health care provider about your test results, treatment options, and if necessary, the need for more tests. Follow these instructions at home: Eating and drinking  Eat a diet that includes fresh fruits and vegetables, whole grains, lean protein, and low-fat dairy products. Limit your intake of foods with high amounts of sugar, saturated fats, and salt. Take vitamin and mineral supplements as recommended by your health care provider. Do not drink alcohol if your health care provider tells you not to drink. If you drink alcohol: Limit how much you have to 0-2 drinks a day. Know how much alcohol is in your drink. In the [place] one drink equals one 12 oz bottle of beer (355 mL), one 5 oz glass of wine (148 mL), or one 1 oz glass of hard liquor (44 mL). Lifestyle Brush your teeth every morning and night with fluoride toothpaste. Floss one time each day. Exercise for at least 30 minutes 5 or more days each week. Do not use any products that contain nicotine or tobacco. These products include cigarettes, chewing tobacco, and vaping devices, such as e-cigarettes. If you need help quitting, ask your health care provider. Do not use drugs. If you are sexually active, practice safe sex. Use a condom or other form of protection to prevent STIs. Take aspirin only as told by your health care  provider. Make sure that you understand how much to take and what form to take. Work with your health care provider to find out whether it is safe and beneficial for you to take  aspirin daily. Ask your health care provider if you need to take a cholesterol-lowering medicine (statin). Find healthy ways to manage stress, such as: Meditation, yoga, or listening to music. Journaling. Talking to a trusted person. Spending time with friends and family. Safety Always wear your seat belt while driving or riding in a vehicle. Do not drive: If you have been drinking alcohol. Do not ride with someone who has been drinking. When you are tired or distracted. While texting. If you have been using any mind-altering substances or drugs. Wear a helmet and other protective equipment during sports activities. If you have firearms in your house, make sure you follow all gun safety procedures. Minimize exposure to UV radiation to reduce your risk of skin cancer. What's next? Visit your health care provider once a year for an annual wellness visit. Ask your health care provider how often you should have your eyes and teeth checked. Stay up to date on all vaccines. This information is not intended to replace advice given to you by your health care provider. Make sure you discuss any questions you have with your health care provider. Document Revised: 01/18/2021 Document Reviewed: 01/18/2021 Elsevier Patient Education  [place].

## 2022-04-25 NOTE — Assessment & Plan Note (Addendum)
No tremor noted today

## 2022-04-25 NOTE — Assessment & Plan Note (Signed)
No LUTs with flomax Also followed by [facility]: normal urinalysis per urology note Repeat PSA today

## 2022-04-25 NOTE — Assessment & Plan Note (Signed)
ABI home test: R at 0.26 and L at 0.79 He denies any claudication or paresthesia. He agreed to repeat arterial duplex today

## 2022-04-25 NOTE — Progress Notes (Signed)
Complete physical exam  Patient: [name]   DOB: [birth year]   81 y.o. Male  MRN: [id number] Visit Date: 04/25/2022  Subjective:    Chief Complaint  Patient presents with   Annual Exam    Wellness exam Pt fasting  No concerns     [name] is a 81 y.o. male who presents today for a complete physical exam. He reports consuming a low fat and low sodium diet.  No exercise  He generally feels well. He reports sleeping well. He does not have additional problems to discuss today.  Vision:Yes Dental:No STD Screen:No PSA:Yes  Health Care Team: Cardiology: Dr. Martinique Dermatology: Dr. Martin Majestic Urology: Dr. Lovena Neighbours  Most recent fall risk assessment:    02/07/2022    1:24 PM  [place] in the past year? 0  Number falls in past yr: 0  Injury with Fall? 0  Risk for fall due to : No Fall Risks  Follow up Falls evaluation completed;Education provided   Most recent depression screenings:    04/25/2022    9:35 AM 02/07/2022    1:24 PM  PHQ 2/9 Scores  PHQ - 2 Score 0 0  PHQ- 9 Score 0    HPI  Decreased pedal pulses ABI home test: R at 0.26 and L at 0.79 He denies any claudication or paresthesia. He agreed to repeat arterial duplex today  Elevated PSA No LUTs with flomax Also followed by [facility]: normal urinalysis per urology note Repeat PSA today  Essential tremor No tremor noted today   Past Medical History:  Diagnosis Date   Acute blood loss anemia 03/01/2018   Acute GI bleeding 02/28/2018   Basal cell carcinoma    Chronic atrial fibrillation (HCC)    Chronic dermatitis    CKD (chronic kidney disease), stage III ([place])    a. Probable based on historical Cr. H/o ARF. Previously saw Dr. Florene Glen.   Erectile dysfunction    GI bleed    Hypertension    Hypokalemia    Meningioma (HCC)    Probable 13 x 12 mm meningioma in right frontal region   Thrombocytopenia ([place])    improved   Past Surgical History:  Procedure Laterality Date    ESOPHAGOGASTRODUODENOSCOPY (EGD) WITH PROPOFOL N/A 03/01/2018   Procedure: ESOPHAGOGASTRODUODENOSCOPY (EGD) WITH PROPOFOL;  Surgeon: Doran Stabler, MD;  Location: [place];  Service: Gastroenterology;  Laterality: N/A;   EYE SURGERY     spot removed from cornea   SCHLEROTHERAPY  03/01/2018   Procedure: SCHLEROTHERAPY OF VARICES;  Surgeon: Doran Stabler, MD;  Location: [place];  Service: Gastroenterology;;  epinephrine 1:10,000 injection / hemostasis clips   TEE WITH CARDIOVERSION  12/14/2004   Successful TEE guided electrical cardioversion. -- showed mild mitral insufficiency and trace pulmonic insufficiency    TOENAIL EXCISION     ingrown toenail removal   TONSILLECTOMY     Social History   Socioeconomic History   Marital status: Married    Spouse name: Not on file   Number of children: 2   Years of education: Not on file   Highest education level: Not on file  Occupational History   Occupation: managed salvage yard    Employer: RETIRED  Tobacco Use   Smoking status: Former    Packs/day: 1.00    Years: 10.00    Total pack years: 10.00    Types: Cigarettes    Quit date: 04/25/1970    Years since quitting:  52.0   Smokeless tobacco: Never  Vaping Use   Vaping Use: Never used  Substance and Sexual Activity   Alcohol use: No   Drug use: No   Sexual activity: Yes  Other Topics Concern   Not on file  Social History Narrative   Keeps sister who is mentally retarded.   Social Determinants of Health   Financial Resource Strain: Low Risk  (02/07/2022)   Overall Financial Resource Strain (CARDIA)    Difficulty of Paying Living Expenses: Not hard at all  Food Insecurity: No Food Insecurity (02/07/2022)   Hunger Vital Sign    Worried About Running Out of Food in the Last Year: Never true    Ran Out of Food in the Last Year: Never true  Transportation Needs: No Transportation Needs (02/07/2022)   PRAPARE - Hydrologist (Medical): No     Lack of Transportation (Non-Medical): No  Physical Activity: Insufficiently Active (02/07/2022)   Exercise Vital Sign    Days of Exercise per Week: 3 days    Minutes of Exercise per Session: 30 min  Stress: No Stress Concern Present (02/07/2022)   [place]    Feeling of Stress : Not at all  Social Connections: Moderately Integrated (02/07/2022)   Social Connection and Isolation Panel [NHANES]    Frequency of Communication with Friends and Family: Three times a week    Frequency of Social Gatherings with Friends and Family: Three times a week    Attends Religious Services: More than 4 times per year    Active Member of Clubs or Organizations: No    Attends Archivist Meetings: Never    Marital Status: Married  Human resources officer Violence: Not At Risk (02/07/2022)   Humiliation, Afraid, Rape, and Kick questionnaire    Fear of Current or Ex-Partner: No    Emotionally Abused: No    Physically Abused: No    Sexually Abused: No   Family Status  Relation Name Status   Mother  Deceased   Father  Deceased   Sister  Alive   Brother  Alive   Brother  Alive   Brother  Web designer  (Not Specified)   Sister  (Not Specified)   Sister  (Not Specified)   MGM  Deceased   MGF  Deceased   PGM  Deceased   PGF  Deceased   Daughter  Alive   Daughter  Alive   Family History  Problem Relation Age of Onset   Heart disease Mother        with pacemaker   Diabetes Mother    Asthma Mother    Heart failure Mother    Bladder Cancer Mother    Hypertension Father    Pancreatic cancer Father    Diabetes Brother    Hypertension Sister    Diabetes Sister    Allergies  Allergen Reactions   Hctz [Hydrochlorothiazide] Photosensitivity   Cipro Iv [Ciprofloxacin] Hives and Rash    Caused red streaks up the arm    Patient Care Team: Kahealani Yankovich, Charlene Brooke, NP as PCP - General (Internal Medicine) Martinique, Peter M, MD as Consulting  Physician (Cardiology) Rana Snare, MD (Inactive) as Consulting Physician (Urology) Darleen Crocker, MD as Consulting Physician (Ophthalmology) Rolm Bookbinder, MD as Consulting Physician (Dermatology) Germaine Pomfret, [facility] as Pharmacist (Pharmacist)   Medications: Outpatient Medications Prior to Visit  Medication Sig   Acetaminophen (TYLENOL ARTHRITIS PAIN PO) Take  650 mg by mouth every 8 (eight) hours as needed.   benazepril (LOTENSIN) 20 MG tablet TAKE 1 TABLET BY MOUTH TWICE  DAILY   Cranberry 500 MG CAPS Take 500 mg by mouth daily.   dabigatran (PRADAXA) 150 MG CAPS capsule Take 1 capsule (150 mg total) by mouth 2 (two) times daily.   DILT-XR 240 MG 24 hr capsule TAKE 1 CAPSULE BY MOUTH ONCE  DAILY   furosemide (LASIX) 20 MG tablet Take 20 mg daily if needed for swelling   Garlic 0973 MG CAPS Take 1 capsule by mouth daily.   hydrALAZINE (APRESOLINE) 25 MG tablet TAKE 1 TABLET BY MOUTH 3 TIMES  DAILY   MAGNESIUM PO Take 250 mg by mouth daily.   metoprolol tartrate (LOPRESSOR) 50 MG tablet Take 1 tablet (50 mg total) by mouth 2 (two) times daily.   Multiple Vitamin (MULTIVITAMIN) tablet Take 1 tablet by mouth daily.   pantoprazole (PROTONIX) 20 MG tablet Take 1 tablet (20 mg total) by mouth daily.   potassium chloride SA (KLOR-CON M) 20 MEQ tablet Take 1 tablet (20 mEq total) by mouth daily.   sildenafil (VIAGRA) 100 MG tablet Take 100 mg by mouth daily as needed for erectile dysfunction.   tamsulosin (FLOMAX) 0.4 MG CAPS capsule Take 0.4 mg by mouth daily after supper.    No facility-administered medications prior to visit.    Review of Systems  Constitutional:  Negative for fever.  HENT:  Negative for congestion and sore throat.   Eyes:        Negative for visual changes  Respiratory:  Negative for cough and shortness of breath.   Cardiovascular:  Negative for chest pain, palpitations and leg swelling.  Gastrointestinal:  Negative for blood in stool, constipation and  diarrhea.  Genitourinary:  Negative for dysuria, frequency and urgency.  Musculoskeletal:  Negative for myalgias.  Skin:  Negative for rash.  Neurological:  Negative for dizziness and headaches.  Hematological:  Does not bruise/bleed easily.  Psychiatric/Behavioral:  Negative for suicidal ideas. The patient is not nervous/anxious.        Objective:  BP 130/70 (BP Location: Right Arm, Patient Position: Sitting, Cuff Size: Normal)   Pulse (!) 47   Temp (!) 96.3 F (35.7 C) (Temporal)   Ht '6\' 2"'$  (1.88 m)   Wt 207 lb 12.8 oz (94.3 kg)   SpO2 96%   BMI 26.68 kg/m     BP Readings from Last 3 Encounters:  04/25/22 130/70  12/15/21 135/60  04/24/21 138/86   Wt Readings from Last 3 Encounters:  04/25/22 207 lb 12.8 oz (94.3 kg)  12/15/21 206 lb 12.8 oz (93.8 kg)  04/24/21 207 lb 6.4 oz (94.1 kg)   Physical Exam Vitals reviewed.  Constitutional:      General: He is not in acute distress.    Appearance: He is well-developed.  HENT:     Right Ear: Tympanic membrane, ear canal and external ear normal.     Left Ear: Tympanic membrane and external ear normal.     Nose: Nose normal.  Eyes:     Extraocular Movements: Extraocular movements intact.     Conjunctiva/sclera: Conjunctivae normal.     Pupils: Pupils are equal, round, and reactive to light.  Cardiovascular:     Rate and Rhythm: Normal rate and regular rhythm.     Pulses:          Dorsalis pedis pulses are 1+ on the right side and 1+ on the left side.  Posterior tibial pulses are 1+ on the right side and 1+ on the left side.     Heart sounds: Normal heart sounds.  Pulmonary:     Effort: Pulmonary effort is normal. No respiratory distress.     Breath sounds: Normal breath sounds.  Chest:     Chest wall: No tenderness.  Abdominal:     General: Bowel sounds are normal.     Palpations: Abdomen is soft.  Musculoskeletal:        General: Normal range of motion.     Cervical back: Normal range of motion and neck  supple.     Right lower leg: No edema.     Left lower leg: No edema.     Right foot: Normal range of motion. Bunion present.     Left foot: Normal range of motion. Bunion present.  Feet:     Right foot:     Protective Sensation: 6 sites tested.  6 sites sensed.     Skin integrity: Skin integrity normal.     Toenail Condition: Right toenails are abnormally thick.     Left foot:     Protective Sensation: 6 sites tested.  6 sites sensed.     Skin integrity: Skin integrity normal.     Toenail Condition: Left toenails are abnormally thick.  Lymphadenopathy:     Cervical: No cervical adenopathy.  Skin:    General: Skin is warm and dry.  Neurological:     Mental Status: He is alert and oriented to person, place, and time.     Cranial Nerves: No cranial nerve deficit.     Motor: No weakness.     Gait: Gait normal.     Deep Tendon Reflexes: Reflexes are normal and symmetric.  Psychiatric:        Mood and Affect: Mood normal.        Behavior: Behavior normal.        Thought Content: Thought content normal.     No results found for any visits on 04/25/22.    Assessment & Plan:    Routine Health Maintenance and Physical Exam  Immunization History  Administered Date(s) Administered   Fluad Quad(high Dose 65+) 04/24/2021, 04/25/2022   Influenza Split 05/13/2012   Influenza Whole 04/06/2010   Influenza, High Dose Seasonal PF 06/12/2016, 04/18/2017, 04/17/2018, 03/26/2019   Influenza,inj,Quad PF,6+ Mos 05/13/2013, 05/18/2014, 05/20/2015   Influenza-Unspecified 06/19/2018, 04/18/2020   PFIZER(Purple Top)SARS-COV-2 Vaccination 08/26/2019, 09/16/2019   Pneumococcal Conjugate-13 05/18/2014   Pneumococcal Polysaccharide-23 05/20/2006   Zoster, Live 08/09/2008   Health Maintenance  Topic Date Due   COVID-19 Vaccine (3 - Pfizer risk series) 05/11/2022 (Originally 10/14/2019)   Zoster Vaccines- Shingrix (1 of 2) 07/25/2022 (Originally 01/09/1960)   TETANUS/TDAP  04/25/2026   Pneumonia  Vaccine 53+ Years old  Completed   INFLUENZA VACCINE  Completed   HPV VACCINES  Aged Out   Discussed health benefits of physical activity, and encouraged him to engage in regular exercise appropriate for his age and condition.  Problem List Items Addressed This Visit       Nervous and Auditory   Essential tremor    No tremor noted today        Other   Decreased pedal pulses    ABI home test: R at 0.26 and L at 0.79 He denies any claudication or paresthesia. He agreed to repeat arterial duplex today      Relevant Orders   VAS [place] LE ART SEG MULTI (Segm&LE Reynauds)  Elevated PSA    No LUTs with flomax Also followed by [facility]: normal urinalysis per urology note Repeat PSA today      Prostate cancer screening   Relevant Orders   PSA, Medicare   Other Visit Diagnoses     Encounter for preventative adult health care exam with abnormal findings    -  Primary   Relevant Orders   Comprehensive metabolic panel   CBC   Lipid panel   Encounter for lipid screening for cardiovascular disease       Relevant Orders   Lipid panel   Need for immunization against influenza       Relevant Orders   Flu Vaccine QUAD High Dose(Fluad) (Completed)      Return in about 1 year (around 04/26/2023) for CPE (fasting).     Wilfred Lacy, NP

## 2022-04-27 NOTE — Addendum Note (Signed)
Addended by: Leana Gamer on: 04/27/2022 09:04 PM   Modules accepted: Orders

## 2022-05-07 ENCOUNTER — Encounter: Payer: Self-pay | Admitting: Nurse Practitioner

## 2022-05-09 ENCOUNTER — Ambulatory Visit (HOSPITAL_COMMUNITY): Admission: RE | Admit: 2022-05-09 | Payer: Medicare Other | Source: Ambulatory Visit

## 2022-07-05 ENCOUNTER — Other Ambulatory Visit: Payer: Self-pay | Admitting: Nurse Practitioner

## 2022-07-05 DIAGNOSIS — Z7901 Long term (current) use of anticoagulants: Secondary | ICD-10-CM

## 2022-07-05 NOTE — Telephone Encounter (Signed)
Chart supports Rx Last OV: 04/2022 Next OV: 02/2023

## 2022-08-16 ENCOUNTER — Other Ambulatory Visit: Payer: Self-pay | Admitting: Cardiology

## 2022-08-16 DIAGNOSIS — I482 Chronic atrial fibrillation, unspecified: Secondary | ICD-10-CM

## 2022-08-27 DIAGNOSIS — I4891 Unspecified atrial fibrillation: Secondary | ICD-10-CM | POA: Diagnosis not present

## 2022-08-27 DIAGNOSIS — R55 Syncope and collapse: Secondary | ICD-10-CM | POA: Diagnosis not present

## 2022-08-27 DIAGNOSIS — R001 Bradycardia, unspecified: Secondary | ICD-10-CM | POA: Diagnosis not present

## 2022-08-27 DIAGNOSIS — I499 Cardiac arrhythmia, unspecified: Secondary | ICD-10-CM | POA: Diagnosis not present

## 2022-08-27 DIAGNOSIS — I959 Hypotension, unspecified: Secondary | ICD-10-CM | POA: Diagnosis not present

## 2022-08-29 ENCOUNTER — Other Ambulatory Visit: Payer: Self-pay | Admitting: Cardiology

## 2022-08-29 DIAGNOSIS — I10 Essential (primary) hypertension: Secondary | ICD-10-CM

## 2022-08-30 ENCOUNTER — Other Ambulatory Visit: Payer: Self-pay | Admitting: Cardiology

## 2022-09-02 ENCOUNTER — Other Ambulatory Visit: Payer: Self-pay | Admitting: Cardiology

## 2022-09-04 ENCOUNTER — Ambulatory Visit (HOSPITAL_COMMUNITY)
Admission: RE | Admit: 2022-09-04 | Discharge: 2022-09-04 | Disposition: A | Discharge status: Home / Self Care | Payer: Medicare Other | Source: Ambulatory Visit | Attending: Cardiology | Admitting: Cardiology

## 2022-09-04 DIAGNOSIS — R0989 Other specified symptoms and signs involving the circulatory and respiratory systems: Secondary | ICD-10-CM | POA: Diagnosis not present

## 2022-09-06 LAB — VAS US LOWER EXT ART SEG MULTI (SEGMENTALS & LE RAYNAUDS)
Left ABI: 1.12
Right ABI: 1.15

## 2022-10-25 ENCOUNTER — Other Ambulatory Visit: Payer: Self-pay | Admitting: Cardiology

## 2022-11-20 DIAGNOSIS — L578 Other skin changes due to chronic exposure to nonionizing radiation: Secondary | ICD-10-CM | POA: Diagnosis not present

## 2022-11-20 DIAGNOSIS — Z85828 Personal history of other malignant neoplasm of skin: Secondary | ICD-10-CM | POA: Diagnosis not present

## 2022-11-20 DIAGNOSIS — C44319 Basal cell carcinoma of skin of other parts of face: Secondary | ICD-10-CM | POA: Diagnosis not present

## 2022-11-20 DIAGNOSIS — L570 Actinic keratosis: Secondary | ICD-10-CM | POA: Diagnosis not present

## 2022-12-03 DIAGNOSIS — C44319 Basal cell carcinoma of skin of other parts of face: Secondary | ICD-10-CM | POA: Diagnosis not present

## 2023-01-28 ENCOUNTER — Other Ambulatory Visit: Payer: Self-pay | Admitting: Cardiology

## 2023-01-28 DIAGNOSIS — I482 Chronic atrial fibrillation, unspecified: Secondary | ICD-10-CM

## 2023-02-07 ENCOUNTER — Other Ambulatory Visit: Payer: Self-pay | Admitting: Cardiology

## 2023-02-07 NOTE — Progress Notes (Signed)
[name] Date of Birth: 10-04-1940   History of Present Illness: [name] is seen today for followup of  atrial fibrillation and HTN. He has a history of chronic atrial fibrillation. He failed medical therapy with Sotalol so he has been managed with rate control and anticoagulation with Pradaxa.   He did have an upper GI bleed in July 2019. Was transfused one unit PRBC. Upper EGD showed a Dieulafoy lesion in the stomach. His pradaxa was later resumed and he has no further bleeding.   On follow up today he feels great. He denies any chest pain, SOB, palpitations, dizziness, or edema. Energy level is good. He stays active. Caring for his wife. No bleeding problems. Takes BP at home and it has been well controlled.   Current Outpatient Medications on File Prior to Visit  Medication Sig Dispense Refill   Acetaminophen (TYLENOL ARTHRITIS PAIN PO) Take 650 mg by mouth every 8 (eight) hours as needed.     benazepril (LOTENSIN) 20 MG tablet TAKE 1 TABLET BY MOUTH TWICE  DAILY 200 tablet 2   Cranberry 500 MG CAPS Take 500 mg by mouth daily.     dabigatran (PRADAXA) 150 MG CAPS capsule TAKE 1 CAPSULE BY MOUTH TWICE  DAILY 180 capsule 3   DILT-XR 240 MG 24 hr capsule TAKE 1 CAPSULE BY MOUTH ONCE  DAILY 100 capsule 2   furosemide (LASIX) 20 MG tablet Take 20 mg daily if needed for swelling 90 tablet 3   Garlic 1000 MG CAPS Take 1 capsule by mouth daily.     hydrALAZINE (APRESOLINE) 25 MG tablet TAKE 1 TABLET BY MOUTH 3 TIMES  DAILY 300 tablet 2   MAGNESIUM PO Take 250 mg by mouth daily.     metoprolol tartrate (LOPRESSOR) 50 MG tablet TAKE 1 TABLET BY MOUTH TWICE  DAILY 90 tablet 0   Multiple Vitamin (MULTIVITAMIN) tablet Take 1 tablet by mouth daily.     pantoprazole (PROTONIX) 20 MG tablet TAKE 1 TABLET BY MOUTH DAILY 100 tablet 2   potassium chloride SA (KLOR-CON M) 20 MEQ tablet TAKE 1 TABLET BY MOUTH DAILY 100 tablet 2   sildenafil (VIAGRA) 100 MG tablet Take 100 mg by mouth daily as  needed for erectile dysfunction.     tamsulosin (FLOMAX) 0.4 MG CAPS capsule Take 0.4 mg by mouth daily after supper.   0   No current facility-administered medications on file prior to visit.    Allergies  Allergen Reactions   Hctz [Hydrochlorothiazide] Photosensitivity   Cipro Iv [Ciprofloxacin] Hives and Rash    Caused red streaks up the arm    Past Medical History:  Diagnosis Date   Acute blood loss anemia 03/01/2018   Acute GI bleeding 02/28/2018   Basal cell carcinoma    Chronic atrial fibrillation (HCC)    Chronic dermatitis    CKD (chronic kidney disease), stage III (HCC)    a. Probable based on historical Cr. H/o ARF. Previously saw Dr. Lowell Guitar.   Erectile dysfunction    GI bleed    Hypertension    Hypokalemia    Meningioma (HCC)    Probable 13 x 12 mm meningioma in right frontal region   Thrombocytopenia (HCC)    improved    Past Surgical History:  Procedure Laterality Date   ESOPHAGOGASTRODUODENOSCOPY (EGD) WITH PROPOFOL N/A 03/01/2018   Procedure: ESOPHAGOGASTRODUODENOSCOPY (EGD) WITH PROPOFOL;  Surgeon: Sherrilyn Rist, MD;  Location: [facility] ENDOSCOPY;  Service: Gastroenterology;  Laterality: N/A;  EYE SURGERY     spot removed from cornea   SCHLEROTHERAPY  03/01/2018   Procedure: SCHLEROTHERAPY OF VARICES;  Surgeon: Sherrilyn Rist, MD;  Location: [facility] ENDOSCOPY;  Service: Gastroenterology;;  epinephrine 1:10,000 injection / hemostasis clips   TEE WITH CARDIOVERSION  12/14/2004   Successful TEE guided electrical cardioversion. -- showed mild mitral insufficiency and trace pulmonic insufficiency    TOENAIL EXCISION     ingrown toenail removal   TONSILLECTOMY      Social History   Tobacco Use  Smoking Status Former   Packs/day: 1.00   Years: 10.00   Additional pack years: 0.00   Total pack years: 10.00   Types: Cigarettes   Quit date: 04/25/1970   Years since quitting: 52.8  Smokeless Tobacco Never    Social History   Substance and Sexual Activity   Alcohol Use No    Family History  Problem Relation Age of Onset   Heart disease Mother        with pacemaker   Diabetes Mother    Asthma Mother    Heart failure Mother    Bladder Cancer Mother    Hypertension Father    Pancreatic cancer Father    Diabetes Brother    Hypertension Sister    Diabetes Sister     Review of Systems: As noted in history of present illness.  All other systems were reviewed and are negative.  Physical Exam: BP (!) 150/68 (BP Location: Right Arm, Patient Position: Sitting, Cuff Size: Normal)   Pulse (!) 58   Ht 6\' 2"  (1.88 m)   Wt 208 lb 12.8 oz (94.7 kg)   SpO2 98%   BMI 26.81 kg/m  GENERAL:  Well appearing HEENT:  PERRL, EOMI, sclera are clear. Oropharynx is clear. NECK:  No jugular venous distention, carotid upstroke brisk and symmetric, no bruits, no thyromegaly or adenopathy LUNGS:  Clear to auscultation bilaterally CHEST:  Unremarkable HEART:  IRRR,  PMI not displaced or sustained,S1 and S2 within normal limits, no S3, no S4: no clicks, no rubs, no murmurs ABD:  Soft, nontender. BS +, no masses or bruits. No hepatomegaly, no splenomegaly EXT:  2 + pulses throughout, no edema, no cyanosis no clubbing SKIN:  Warm and dry.  No rashes NEURO:  Alert and oriented x 3. Cranial nerves II through XII intact. PSYCH:  Cognitively intact    LABORATORY DATA: Lab Results  Component Value Date   WBC 6.3 04/25/2022   HGB 13.7 04/25/2022   HCT 40.9 04/25/2022   PLT 139.0 (L) 04/25/2022   GLUCOSE 94 04/25/2022   CHOL 107 04/25/2022   TRIG 70.0 04/25/2022   HDL 45.50 04/25/2022   LDLCALC 48 04/25/2022   ALT 13 04/25/2022   AST 18 04/25/2022   NA 141 04/25/2022   K 3.9 04/25/2022   CL 106 04/25/2022   CREATININE 1.25 04/25/2022   BUN 23 04/25/2022   CO2 26 04/25/2022   TSH 1.400 11/19/2019   PSA 4.06 (H) 04/25/2022   INR 1.55 03/01/2018   EKG Interpretation Date/Time:  Monday February 11 2023 14:56:02 EDT Ventricular Rate:  58 PR  Interval:    QRS Duration:  96 QT Interval:  462 QTC Calculation: 453 R Axis:   20  Text Interpretation: Atrial fibrillation with slow ventricular response Nonspecific ST abnormality When compared with ECG of 28-Feb-2018 13:09, No significant change since last tracing Confirmed by [place], Kelbie Moro [phone]) on 02/11/2023 2:59:38 PM    Assessment / Plan: 1. Atrial  fibrillation.  permanent. He is asymptomatic. Rate is well controlled. Continue Pradaxa.  We will continue a long-term strategy of rate control and anticoagulation.   2. Hypertension. Blood pressure is well controlled.     I will follow up in 1 year.

## 2023-02-11 ENCOUNTER — Encounter: Payer: Self-pay | Admitting: Cardiology

## 2023-02-11 ENCOUNTER — Ambulatory Visit: Payer: Medicare Other | Attending: Cardiology | Admitting: Cardiology

## 2023-02-11 VITALS — BP 150/68 | HR 58 | Ht 74.0 in | Wt 208.8 lb

## 2023-02-11 DIAGNOSIS — I10 Essential (primary) hypertension: Secondary | ICD-10-CM

## 2023-02-11 DIAGNOSIS — I482 Chronic atrial fibrillation, unspecified: Secondary | ICD-10-CM | POA: Diagnosis not present

## 2023-02-11 NOTE — Patient Instructions (Signed)
Medication Instructions:  Continue same medications *If you need a refill on your cardiac medications before your next appointment, please call your pharmacy*   Lab Work: None ordered   Testing/Procedures: None ordered   Follow-Up: At [facility], you and your health needs are our priority.  As part of our continuing mission to provide you with exceptional heart care, we have created designated Provider Care Teams.  These Care Teams include your primary Cardiologist (physician) and Advanced Practice Providers (APPs -  Physician Assistants and Nurse Practitioners) who all work together to provide you with the care you need, when you need it.  We recommend signing up for the patient portal called "MyChart".  Sign up information is provided on this After Visit Summary.  MyChart is used to connect with patients for Virtual Visits (Telemedicine).  Patients are able to view lab/test results, encounter notes, upcoming appointments, etc.  Non-urgent messages can be sent to your provider as well.   To learn more about what you can do with MyChart, go to [email or web address].    Your next appointment:  1 year   Call in March to schedule July appointment     Provider:  Dr.Jordan   

## 2023-02-14 ENCOUNTER — Other Ambulatory Visit: Payer: Self-pay | Admitting: Cardiology

## 2023-02-14 DIAGNOSIS — I482 Chronic atrial fibrillation, unspecified: Secondary | ICD-10-CM

## 2023-03-18 ENCOUNTER — Other Ambulatory Visit: Payer: Self-pay | Admitting: Nurse Practitioner

## 2023-03-18 DIAGNOSIS — Z7901 Long term (current) use of anticoagulants: Secondary | ICD-10-CM

## 2023-05-03 ENCOUNTER — Ambulatory Visit (INDEPENDENT_AMBULATORY_CARE_PROVIDER_SITE_OTHER): Payer: Medicare Other

## 2023-05-03 DIAGNOSIS — Z0000 Encounter for general adult medical examination without abnormal findings: Secondary | ICD-10-CM

## 2023-05-03 NOTE — Patient Instructions (Addendum)
Mr. Daleo , Thank you for taking time to come for your Medicare Wellness Visit. I appreciate your ongoing commitment to your health goals. Please review the following plan we discussed and let me know if I can assist you in the future.   Referrals/Orders/Follow-Ups/Clinician Recommendations: none  This is a list of the screening recommended for you and due dates:  Health Maintenance  Topic Date Due   Zoster (Shingles) Vaccine (1 of 2) 01/09/1959   COVID-19 Vaccine (3 - Pfizer risk series) 10/14/2019   Flu Shot  03/07/2023   Medicare Annual Wellness Visit  05/02/2024   DTaP/Tdap/Td vaccine (2 - Td or Tdap) 06/14/2026   Pneumonia Vaccine  Completed   HPV Vaccine  Aged Out    Advanced directives: (In Chart) A copy of your advanced directives are scanned into your chart should your provider ever need it.  Next Medicare Annual Wellness Visit scheduled for next year: Yes  insert Preventive Care attachment Insert FALL PREVENTION attachment if needed

## 2023-05-03 NOTE — Progress Notes (Signed)
Subjective:   [name] is a 82 y.o. male who presents for Medicare Annual/Subsequent preventive examination.  Visit Complete: Virtual  I connected with  [name] on 05/03/23 by a audio enabled telemedicine application and verified that I am speaking with the correct person using two identifiers.  Patient Location: Home  Provider Location: Office/Clinic  I discussed the limitations of evaluation and management by telemedicine. The patient expressed understanding and agreed to proceed.  Because this visit was a virtual/telehealth visit, some criteria may be missing or patient reported. Any vitals not documented were not able to be obtained and vitals that have been documented are patient reported.   Cardiac Risk Factors include: advanced age (>43men, >5 women);hypertension;male gender     Objective:    Today's Vitals   There is no height or weight on file to calculate BMI.     05/03/2023   11:01 AM 02/07/2022    1:24 PM 04/24/2021    3:49 PM 12/27/2020   11:43 AM 02/28/2018   10:21 PM 06/21/2017    9:07 AM 06/12/2016    4:38 PM  Advanced Directives  Does Patient Have a Medical Advance Directive? Yes No Yes No No No No  Type of Advance Directive Out of facility DNR (pink MOST or yellow form)  Out of facility DNR (pink MOST or yellow form);Living will      Does patient want to make changes to medical advance directive?   Yes (MAU/Ambulatory/Procedural Areas - Information given)      Would patient like information on creating a medical advance directive?  No - Patient declined  Yes (MAU/Ambulatory/Procedural Areas - Information given) Yes (Inpatient - patient requests chaplain consult to create a medical advance directive) Yes (MAU/Ambulatory/Procedural Areas - Information given) Yes - Educational materials given  Pre-existing out of facility DNR order (yellow form or pink MOST form)   Yellow form placed in chart (order not valid for inpatient use)        Current  Medications (verified) Outpatient Encounter Medications as of 05/03/2023  Medication Sig   Acetaminophen (TYLENOL ARTHRITIS PAIN PO) Take 650 mg by mouth every 8 (eight) hours as needed.   benazepril (LOTENSIN) 20 MG tablet TAKE 1 TABLET BY MOUTH TWICE  DAILY   Cranberry 500 MG CAPS Take 500 mg by mouth daily.   dabigatran (PRADAXA) 150 MG CAPS capsule TAKE 1 CAPSULE BY MOUTH TWICE  DAILY   DILT-XR 240 MG 24 hr capsule TAKE 1 CAPSULE BY MOUTH ONCE  DAILY   furosemide (LASIX) 20 MG tablet Take 20 mg daily if needed for swelling   Garlic 1000 MG CAPS Take 1 capsule by mouth daily.   hydrALAZINE (APRESOLINE) 25 MG tablet TAKE 1 TABLET BY MOUTH 3 TIMES  DAILY   MAGNESIUM PO Take 250 mg by mouth daily.   metoprolol tartrate (LOPRESSOR) 50 MG tablet TAKE 1 TABLET BY MOUTH TWICE  DAILY   Multiple Vitamin (MULTIVITAMIN) tablet Take 1 tablet by mouth daily.   pantoprazole (PROTONIX) 20 MG tablet TAKE 1 TABLET BY MOUTH DAILY   potassium chloride SA (KLOR-CON M) 20 MEQ tablet TAKE 1 TABLET BY MOUTH DAILY   sildenafil (VIAGRA) 100 MG tablet Take 100 mg by mouth daily as needed for erectile dysfunction.   tamsulosin (FLOMAX) 0.4 MG CAPS capsule Take 0.4 mg by mouth daily after supper.    No facility-administered encounter medications on file as of 05/03/2023.    Allergies (verified) Hctz [hydrochlorothiazide] and Cipro iv [ciprofloxacin]  History: Past Medical History:  Diagnosis Date   Acute blood loss anemia 03/01/2018   Acute GI bleeding 02/28/2018   Basal cell carcinoma    Chronic atrial fibrillation (HCC)    Chronic dermatitis    CKD (chronic kidney disease), stage III (HCC)    a. Probable based on historical Cr. H/o ARF. Previously saw Dr. Lowell Guitar.   Erectile dysfunction    GI bleed    Hypertension    Hypokalemia    Meningioma (HCC)    Probable 13 x 12 mm meningioma in right frontal region   Thrombocytopenia (HCC)    improved   Past Surgical History:  Procedure Laterality Date    ESOPHAGOGASTRODUODENOSCOPY (EGD) WITH PROPOFOL N/A 03/01/2018   Procedure: ESOPHAGOGASTRODUODENOSCOPY (EGD) WITH PROPOFOL;  Surgeon: Sherrilyn Rist, MD;  Location[facility] ENDOSCOPY;  Service: Gastroenterology;  Laterality: N/A;   EYE SURGERY     spot removed from cornea   SCHLEROTHERAPY  03/01/2018   Procedure: SCHLEROTHERAPY OF VARICES;  Surgeon: Sherrilyn Rist, MD;  Location: [facility] ENDOSCOPY;  Service: Gastroenterology;;  epinephrine 1:10,000 injection / hemostasis clips   TEE WITH CARDIOVERSION  12/14/2004   Successful TEE guided electrical cardioversion. -- showed mild mitral insufficiency and trace pulmonic insufficiency    TOENAIL EXCISION     ingrown toenail removal   TONSILLECTOMY     Family History  Problem Relation Age of Onset   Heart disease Mother        with pacemaker   Diabetes Mother    Asthma Mother    Heart failure Mother    Bladder Cancer Mother    Hypertension Father    Pancreatic cancer Father    Diabetes Brother    Hypertension Sister    Diabetes Sister    Social History   Socioeconomic History   Marital status: Married    Spouse name: Not on file   Number of children: 2   Years of education: Not on file   Highest education level: Not on file  Occupational History   Occupation: managed salvage yard    Employer: RETIRED  Tobacco Use   Smoking status: Former    Current packs/day: 0.00    Average packs/day: 1 pack/day for 10.0 years (10.0 ttl pk-yrs)    Types: Cigarettes    Start date: 04/25/1960    Quit date: 04/25/1970    Years since quitting: 53.0   Smokeless tobacco: Never  Vaping Use   Vaping status: Never Used  Substance and Sexual Activity   Alcohol use: No   Drug use: No   Sexual activity: Yes  Other Topics Concern   Not on file  Social History Narrative   Keeps sister who is mentally retarded.   Social Determinants of Health   Financial Resource Strain: Low Risk  (05/03/2023)   Overall Financial Resource Strain (CARDIA)     Difficulty of Paying Living Expenses: Not hard at all  Food Insecurity: No Food Insecurity (05/03/2023)   Hunger Vital Sign    Worried About Running Out of Food in the Last Year: Never true    Ran Out of Food in the Last Year: Never true  Transportation Needs: No Transportation Needs (05/03/2023)   PRAPARE - Administrator, Civil Service (Medical): No    Lack of Transportation (Non-Medical): No  Physical Activity: Inactive (05/03/2023)   Exercise Vital Sign    Days of Exercise per Week: 0 days    Minutes of Exercise per Session: 0 min  Stress: No Stress Concern Present (05/03/2023)   [employer] of Occupational Health - Occupational Stress Questionnaire    Feeling of Stress : Not at all  Social Connections: Moderately Integrated (05/03/2023)   Social Connection and Isolation Panel [NHANES]    Frequency of Communication with Friends and Family: More than three times a week    Frequency of Social Gatherings with Friends and Family: Once a week    Attends Religious Services: More than 4 times per year    Active Member of [employer] or Organizations: No    Attends Engineer, structural: Never    Marital Status: Married    Tobacco Counseling Counseling given: Not Answered   Clinical Intake:  Pre-visit preparation completed: Yes  Pain : No/denies pain     Nutritional Risks: None Diabetes: No  How often do you need to have someone help you when you read instructions, pamphlets, or other written materials from your doctor or pharmacy?: 1 - Never  Interpreter Needed?: No  Information entered by :: NAllen LPN   Activities of Daily Living    05/03/2023   10:56 AM  In your present state of health, do you have any difficulty performing the following activities:  Hearing? 0  Vision? 0  Difficulty concentrating or making decisions? 0  Walking or climbing stairs? 0  Dressing or bathing? 0  Doing errands, shopping? 0  Preparing Food and eating ? N  Using the  Toilet? N  In the past six months, have you accidently leaked urine? N  Do you have problems with loss of bowel control? N  Managing your Medications? N  Managing your Finances? N  Housekeeping or managing your Housekeeping? N    Patient Care Team: Nche, Bonna Gains, NP as PCP - General (Internal Medicine) [place], Peter M, MD as Consulting Physician (Cardiology) Barron Alvine, MD (Inactive) as Consulting Physician (Urology) Mia Creek, MD as Consulting Physician (Ophthalmology) Venancio Poisson, MD as Consulting Physician (Dermatology) Gaspar Cola, [facility] (Inactive) as Pharmacist (Pharmacist)  Indicate any recent Medical Services you may have received from other than [facility] providers in the past year (date may be approximate).     Assessment:   This is a routine wellness examination for [name].  Hearing/Vision screen Hearing Screening - Comments:: Denies hearing issues Vision Screening - Comments:: Regular eye exams, [facility]   Goals Addressed             This Visit's Progress    Patient Stated       05/03/2023, work on drinking more water       Depression Screen    05/03/2023   11:03 AM 04/25/2022    9:35 AM 02/07/2022    1:24 PM 02/07/2022    1:23 PM 04/24/2021    9:02 AM 12/27/2020   11:44 AM 01/30/2019    9:13 AM  PHQ 2/9 Scores  PHQ - 2 Score 0 0 0 0 0 0 0  PHQ- 9 Score 0 0         Fall Risk    05/03/2023   11:02 AM 02/07/2022    1:24 PM 04/24/2021    9:02 AM 12/27/2020   11:44 AM 01/30/2019    9:13 AM  Fall Risk   Falls in the past year? 0 0 0 0 0  Number falls in past yr: 0 0 0 0   Injury with Fall? 0 0 0 0   Risk for fall due to : Medication side effect No Fall  Risks No Fall Risks    Follow up Falls prevention discussed;Falls evaluation completed Falls evaluation completed;Education provided Falls evaluation completed;Falls prevention discussed;Education provided Falls prevention discussed     MEDICARE RISK AT HOME: Medicare Risk at Home Any  stairs in or around the home?: Yes If so, are there any without handrails?: No Home free of loose throw rugs in walkways, pet beds, electrical cords, etc?: Yes Adequate lighting in your home to reduce risk of falls?: Yes Life alert?: No Use of a cane, walker or w/c?: No Grab bars in the bathroom?: Yes Shower chair or bench in shower?: Yes Elevated toilet seat or a handicapped toilet?: Yes  TIMED UP AND GO:  Was the test performed?  No    Cognitive Function:    04/24/2021   10:03 AM 06/21/2017    9:11 AM 06/12/2016    4:43 PM 06/12/2016    4:42 PM  MMSE - Mini Mental State Exam  Orientation to time 5 5 5 5   Orientation to Place 5 5 5    Registration 3 3 3    Attention/ Calculation 5 5 5    Recall 3 3 3    Language- name 2 objects 2 2 2    Language- repeat 1 1 1    Language- follow 3 step command 3 3 3    Language- read & follow direction 1 1 1    Write a sentence 1 1 1    Copy design 1 1 1    Total score 30 30 30          05/03/2023   11:03 AM  6CIT Screen  What Year? 0 points  What month? 0 points  What time? 0 points  Count back from 20 0 points  Months in reverse 0 points  Repeat phrase 2 points  Total Score 2 points    Immunizations Immunization History  Administered Date(s) Administered   Fluad Quad(high Dose 65+) 04/24/2021, 04/25/2022   Influenza Split 05/13/2012   Influenza Whole 04/06/2010   Influenza, High Dose Seasonal PF 06/12/2016, 04/18/2017, 04/17/2018, 03/26/2019   Influenza,inj,Quad PF,6+ Mos 05/13/2013, 05/18/2014, 05/20/2015   Influenza-Unspecified 06/19/2018, 04/18/2020   PFIZER(Purple Top)SARS-COV-2 Vaccination 08/26/2019, 09/16/2019   Pneumococcal Conjugate-13 05/18/2014   Pneumococcal Polysaccharide-23 05/20/2006   Zoster, Live 08/09/2008    TDAP status: Up to date  Flu Vaccine status: Due, Education has been provided regarding the importance of this vaccine. Advised may receive this vaccine at local pharmacy or Health Dept. Aware to provide a  copy of the vaccination record if obtained from local pharmacy or Health Dept. Verbalized acceptance and understanding.  Pneumococcal vaccine status: Up to date  Covid-19 vaccine status: Information provided on how to obtain vaccines.   Qualifies for Shingles Vaccine? Yes   Zostavax completed Yes   Shingrix Completed?: No.    Education has been provided regarding the importance of this vaccine. Patient has been advised to call insurance company to determine out of pocket expense if they have not yet received this vaccine. Advised may also receive vaccine at local pharmacy or Health Dept. Verbalized acceptance and understanding.  Screening Tests Health Maintenance  Topic Date Due   DTaP/Tdap/Td (1 - Tdap) Never done   Zoster Vaccines- Shingrix (1 of 2) 01/09/1960   COVID-19 Vaccine (3 - Pfizer risk series) 10/14/2019   INFLUENZA VACCINE  03/07/2023   Medicare Annual Wellness (AWV)  05/02/2024   Pneumonia Vaccine 24+ Years old  Completed   HPV VACCINES  Aged Out    Health Maintenance  Health Maintenance Due  Topic Date Due   DTaP/Tdap/Td (1 - Tdap) Never done   Zoster Vaccines- Shingrix (1 of 2) 01/09/1960   COVID-19 Vaccine (3 - Pfizer risk series) 10/14/2019   INFLUENZA VACCINE  03/07/2023    Colorectal cancer screening: No longer required.   Lung Cancer Screening: (Low Dose CT Chest recommended if Age 2-80 years, 20 pack-year currently smoking OR have quit w/in 15years.) does not qualify.   Lung Cancer Screening Referral: no  Additional Screening:  Hepatitis C Screening: does not qualify;   Vision Screening: Recommended annual ophthalmology exams for early detection of glaucoma and other disorders of the eye. Is the patient up to date with their annual eye exam?  Yes  Who is the provider or what is the name of the office in which the patient attends annual eye exams? [facility] If pt is not established with a provider, would they like to be referred to a provider to  establish care? No .   Dental Screening: Recommended annual dental exams for proper oral hygiene  Diabetic Foot Exam: n/a  Community Resource Referral / Chronic Care Management: CRR required this visit?  No   CCM required this visit?  No     Plan:     I have personally reviewed and noted the following in the patient's chart:   Medical and social history Use of alcohol, tobacco or illicit drugs  Current medications and supplements including opioid prescriptions. Patient is not currently taking opioid prescriptions. Functional ability and status Nutritional status Physical activity Advanced directives List of other physicians Hospitalizations, surgeries, and ER visits in previous 12 months Vitals Screenings to include cognitive, depression, and falls Referrals and appointments  In addition, I have reviewed and discussed with patient certain preventive protocols, quality metrics, and best practice recommendations. A written personalized care plan for preventive services as well as general preventive health recommendations were provided to patient.     Barb Merino, LPN   11/12/8117   After Visit Summary: (MyChart) Due to this being a telephonic visit, the after visit summary with patients personalized plan was offered to patient via MyChart   Nurse Notes: none

## 2023-05-10 ENCOUNTER — Ambulatory Visit (INDEPENDENT_AMBULATORY_CARE_PROVIDER_SITE_OTHER): Payer: Medicare Other | Admitting: Nurse Practitioner

## 2023-05-10 ENCOUNTER — Encounter: Payer: Self-pay | Admitting: Nurse Practitioner

## 2023-05-10 VITALS — BP 130/80 | HR 85 | Temp 98.0°F | Ht 74.0 in | Wt 202.6 lb

## 2023-05-10 DIAGNOSIS — N1831 Chronic kidney disease, stage 3a: Secondary | ICD-10-CM | POA: Diagnosis not present

## 2023-05-10 DIAGNOSIS — Z13220 Encounter for screening for lipoid disorders: Secondary | ICD-10-CM | POA: Diagnosis not present

## 2023-05-10 DIAGNOSIS — Z136 Encounter for screening for cardiovascular disorders: Secondary | ICD-10-CM

## 2023-05-10 DIAGNOSIS — G250 Essential tremor: Secondary | ICD-10-CM

## 2023-05-10 DIAGNOSIS — Z125 Encounter for screening for malignant neoplasm of prostate: Secondary | ICD-10-CM | POA: Diagnosis not present

## 2023-05-10 DIAGNOSIS — Z0001 Encounter for general adult medical examination with abnormal findings: Secondary | ICD-10-CM | POA: Diagnosis not present

## 2023-05-10 DIAGNOSIS — R0989 Other specified symptoms and signs involving the circulatory and respiratory systems: Secondary | ICD-10-CM | POA: Diagnosis not present

## 2023-05-10 DIAGNOSIS — I10 Essential (primary) hypertension: Secondary | ICD-10-CM

## 2023-05-10 NOTE — Addendum Note (Signed)
Addended by: Waymond Cera on: 05/10/2023 04:02 PM   Modules accepted: Orders

## 2023-05-10 NOTE — Assessment & Plan Note (Addendum)
Normal ABI 08/2022 Denies any pain or swelling or paresthesia or change in gait

## 2023-05-10 NOTE — Assessment & Plan Note (Addendum)
Resting Tremor of chin, intention tremor in left arm, Tremor in voice [name] denies any dysphagia or change in gait or mood. He denies any falls

## 2023-05-10 NOTE — Assessment & Plan Note (Signed)
BP at goal Managed by cardiology with hydralazine, furosemide, benazepril, and metoprolol BP Readings from Last 3 Encounters:  05/10/23 130/80  02/11/23 (!) 150/68  04/25/22 130/70   Check CMP

## 2023-05-10 NOTE — Patient Instructions (Signed)
Go to lab Continue Heart healthy diet and daily exercise. Maintain current medications.  Exercise Information for Aging Adults Staying physically active is important as you age. Physical activity and exercise can help in maintaining quality of life, health, physical function, and reducing falls. The four types of exercises that are best for older adults are endurance, strength, balance, and flexibility. Contact your health care provider before you start any exercise routine. Ask your health care provider what activities are safe for you. What are the risks? Risks associated with exercising include: Overdoing it. This may lead to sore muscles or fatigue. Falls. Injuries. Dehydration. How to do these exercises Endurance exercises Endurance (aerobic) exercises raise your breathing rate and heart rate. Increasing your endurance helps you do everyday tasks and stay healthy. By improving the health of your body system that includes your heart, lungs, and blood vessels (circulatory system), you may also delay or prevent diseases such as heart disease, diabetes, and weak bones (osteoporosis). Types of endurance exercises include: Sports. Indoor activities, such as using gym equipment, doing water aerobics, or dancing. Outdoor activities, such as biking or jogging. Tasks around the house, such as gardening, yard work, and heavy household chores like cleaning. Walking, such as hiking or walking around your neighborhood. When doing endurance exercises, make sure you: Are aware of your surroundings. Use safety equipment as directed. Dress in layers when exercising outdoors. Drink plenty of water to stay well hydrated. Build up endurance slowly. Start with 10 minutes at a time, and gradually build up to doing 30 minutes at a time. Unless your health care provider gave you different instructions, aim to exercise for a total of 150 minutes a week. Spread out that time so you are working on endurance 3 or  more days a week. Strength exercises Lifting, pulling, or pushing weights helps to strengthen muscles. Having stronger muscles makes it easier to do everyday activities, such as getting up from a chair, climbing stairs, carrying groceries, and playing with grandchildren. Strength exercises include arm and leg exercises that may be done: With weights. Without weights (using your own body weight). With a resistance band. When doing strength exercises: Move smoothly and steadily. Do not suddenly thrust or jerk the weights, the resistance band, or your body. Start with no weights or with light weights, and gradually add more weight over time. Eventually, aim to use weights that are hard or very hard for you to lift. This means that you are able to do 8 repetitions with the weight, and the last few repetitions are very challenging. Lift or push weights into position for 3 seconds, hold the position for 1 second, and then take 3 seconds to return to your starting position. Breathe out (exhale) during difficult movements, like lifting or pushing weights. Breathe in (inhale) to relax your muscles before the next repetition. Consider alternating arms or legs, especially when you first start strength exercises. Expect some slight muscle soreness after each session. Do strength exercises on 2 or more days a week, for 30 minutes at a time. Avoid exercising the same muscle groups two days in a row. For example, if you work on your leg muscles one day, work on your arm muscles the next day. When you can do two sets of 10-15 repetitions with a certain weight, increase the amount of weight. Balance exercises Balance exercises can help to prevent falls. Balance exercises include: Standing on one foot. Heel-to-toe walk. Balance walk. Tai chi. Make sure you have something sturdy to  hold onto while doing balance exercises, such as a sturdy chair. As your balance improves, challenge yourself by holding on to the  chair with one hand instead of two, and then with no hands. Trying exercises with your eyes closed also challenges your balance, but be sure to have a sturdy surface (like a countertop) close by in case you need it. Do balance exercises as often as you want, or as often as directed by your health care provider. Flexibility exercises  Flexibility exercises improve how far you can bend, straighten, move, or rotate parts of your body (range of motion). These exercises also help you do everyday activities such as getting dressed or reaching for objects. Flexibility exercises include stretching different parts of the body, and they may be done in a standing or seated position or on the floor. When stretching, make sure you: Keep a slight bend in your arms and legs. Avoid completely straightening ("locking") your joints. Do not stretch so far that you feel pain. You should feel a mild stretching feeling. You may try stretching farther as you become more flexible over time. Relax and breathe between stretches. Hold on to something sturdy for balance as needed. Hold each stretch for 10-30 seconds. Repeat each stretch 3-5 times. General safety tips Exercise in well-lit areas. Do not hold your breath during exercises or stretches. Warm up before exercising, and cool down after exercising. This can help prevent injury. Drink plenty of water during exercise or any activity that makes you sweat. If you are not sure if an exercise is safe for you, or you are not sure how to do an exercise, talk with your health care provider. This is especially important if you have had surgery on muscles, bones, or joints (orthopedic surgery). Where to find more information You can find more information about exercise for older adults from: Your local health department, fitness center, or community center. These facilities may have programs for aging adults. [employer] on Aging: https://walker.com/ National Council on  Aging: www.ncoa.org Summary Staying physically active is important as you age. Doing endurance, strength, balance, and flexibility exercises can help in maintaining quality of life, health, physical function, and reducing falls. Make sure to contact your health care provider before you start any exercise routine. Ask your health care provider what activities are safe for you. This information is not intended to replace advice given to you by your health care provider. Make sure you discuss any questions you have with your health care provider. Document Revised: 12/05/2020 Document Reviewed: 12/05/2020 Elsevier Patient Education  2024 [employer].

## 2023-05-10 NOTE — Assessment & Plan Note (Signed)
Repeat CMP

## 2023-05-10 NOTE — Progress Notes (Signed)
Complete physical exam  Patient: [name]   DOB: 1941/01/24   82 y.o. Male  MRN: [id number] Visit Date: 05/10/2023  Subjective:    Chief Complaint  Patient presents with   Annual Exam   Hypertension   Accompanied by [name] today.  [name] is a 82 y.o. male who presents today for a complete physical exam. He reports consuming a general diet.  No exercise regimen.  He generally feels well. He reports sleeping well. He does not have additional problems to discuss today.  Vision:Yes Dental:No STD Screen:No PSA:Yes  Health Care Team: Cardiology: Dr. Swaziland Dermatology: Dr. Roderic Scarce Urology: Dr. Liliane Shi  BP Readings from Last 3 Encounters:  05/10/23 130/80  02/11/23 (!) 150/68  04/25/22 130/70   Weight loss is intentional per [name] Readings from Last 3 Encounters:  05/10/23 202 lb 9.6 oz (91.9 kg)  02/11/23 208 lb 12.8 oz (94.7 kg)  04/25/22 207 lb 12.8 oz (94.3 kg)   Most recent fall risk assessment:    05/03/2023   11:02 AM  Fall Risk   Falls in the past year? 0  Number falls in past yr: 0  Injury with Fall? 0  Risk for fall due to : Medication side effect  Follow up Falls prevention discussed;Falls evaluation completed   Depression screen:Yes - No Depression Most recent depression screenings:    05/03/2023   11:03 AM 04/25/2022    9:35 AM  PHQ 2/9 Scores  PHQ - 2 Score 0 0  PHQ- 9 Score 0 0   Decreased pedal pulses Normal ABI 08/2022 Denies any pain or swelling or paresthesia or change in gait  CKD (chronic kidney disease), stage III Repeat CMP  Essential hypertension BP at goal Managed by cardiology with hydralazine, furosemide, benazepril, and metoprolol BP Readings from Last 3 Encounters:  05/10/23 130/80  02/11/23 (!) 150/68  04/25/22 130/70   Check CMP  Essential tremor Resting Tremor of chin, intention tremor in left arm, Tremor in voice [name] denies any dysphagia or change in gait or mood. He denies any falls  Past  Medical History:  Diagnosis Date   Acute blood loss anemia 03/01/2018   Acute GI bleeding 02/28/2018   Basal cell carcinoma    Chronic atrial fibrillation (HCC)    Chronic dermatitis    CKD (chronic kidney disease), stage III (HCC)    a. Probable based on historical Cr. H/o ARF. Previously saw Dr. Lowell Guitar.   Erectile dysfunction    GI bleed    Hypertension    Hypokalemia    Meningioma (HCC)    Probable 13 x 12 mm meningioma in right frontal region   Thrombocytopenia (HCC)    improved   Past Surgical History:  Procedure Laterality Date   ESOPHAGOGASTRODUODENOSCOPY (EGD) WITH PROPOFOL N/A 03/01/2018   Procedure: ESOPHAGOGASTRODUODENOSCOPY (EGD) WITH PROPOFOL;  Surgeon: Sherrilyn Rist, MD;  Location: [facility] ENDOSCOPY;  Service: Gastroenterology;  Laterality: N/A;   EYE SURGERY     spot removed from cornea   SCHLEROTHERAPY  03/01/2018   Procedure: SCHLEROTHERAPY OF VARICES;  Surgeon: Sherrilyn Rist, MD;  Location: [facility] ENDOSCOPY;  Service: Gastroenterology;;  epinephrine 1:10,000 injection / hemostasis clips   TEE WITH CARDIOVERSION  12/14/2004   Successful TEE guided electrical cardioversion. -- showed mild mitral insufficiency and trace pulmonic insufficiency    TOENAIL EXCISION     ingrown toenail removal   TONSILLECTOMY     Social History   Socioeconomic History  Marital status: Married    Spouse name: Not on file   Number of children: 2   Years of education: Not on file   Highest education level: Not on file  Occupational History   Occupation: managed salvage yard    Employer: RETIRED  Tobacco Use   Smoking status: Former    Current packs/day: 0.00    Average packs/day: 1 pack/day for 10.0 years (10.0 ttl pk-yrs)    Types: Cigarettes    Start date: 04/25/1960    Quit date: 04/25/1970    Years since quitting: 53.0   Smokeless tobacco: Never  Vaping Use   Vaping status: Never Used  Substance and Sexual Activity   Alcohol use: No   Drug use: No   Sexual activity:  Yes  Other Topics Concern   Not on file  Social History Narrative   Keeps sister who is mentally retarded.   Social Determinants of Health   Financial Resource Strain: Low Risk  (05/03/2023)   Overall Financial Resource Strain (CARDIA)    Difficulty of Paying Living Expenses: Not hard at all  Food Insecurity: No Food Insecurity (05/03/2023)   Hunger Vital Sign    Worried About Running Out of Food in the Last Year: Never true    Ran Out of Food in the Last Year: Never true  Transportation Needs: No Transportation Needs (05/03/2023)   PRAPARE - Administrator, Civil Service (Medical): No    Lack of Transportation (Non-Medical): No  Physical Activity: Inactive (05/03/2023)   Exercise Vital Sign    Days of Exercise per Week: 0 days    Minutes of Exercise per Session: 0 min  Stress: No Stress Concern Present (05/03/2023)   [employer] of Occupational Health - Occupational Stress Questionnaire    Feeling of Stress : Not at all  Social Connections: Moderately Integrated (05/03/2023)   Social Connection and Isolation Panel [NHANES]    Frequency of Communication with Friends and Family: More than three times a week    Frequency of Social Gatherings with Friends and Family: Once a week    Attends Religious Services: More than 4 times per year    Active Member of [employer] or Organizations: No    Attends Banker Meetings: Never    Marital Status: Married  Catering manager Violence: Not At Risk (05/03/2023)   Humiliation, Afraid, Rape, and Kick questionnaire    Fear of Current or Ex-Partner: No    Emotionally Abused: No    Physically Abused: No    Sexually Abused: No   Family Status  Relation Name Status   Mother  Deceased   Father  Deceased   Sister  Alive   Brother  Alive   Brother  Alive   Brother  Alive   Brother  (Not Specified)   Sister  (Not Specified)   Sister  (Not Specified)   MGM  Deceased   MGF  Deceased   PGM  Deceased   PGF  Deceased    Daughter  Alive   Daughter  Alive  No partnership data on file   Family History  Problem Relation Age of Onset   Heart disease Mother        with pacemaker   Diabetes Mother    Asthma Mother    Heart failure Mother    Bladder Cancer Mother    Hypertension Father    Pancreatic cancer Father    Diabetes Brother    Hypertension Sister  Diabetes Sister    Allergies  Allergen Reactions   Hctz [Hydrochlorothiazide] Photosensitivity   Cipro Iv [Ciprofloxacin] Hives and Rash    Caused red streaks up the arm    Patient Care Team: Keshayla Schrum, Bonna Gains, NP as PCP - General (Internal Medicine) Swaziland, Peter M, MD as Consulting Physician (Cardiology) Barron Alvine, MD (Inactive) as Consulting Physician (Urology) Mia Creek, MD as Consulting Physician (Ophthalmology) Venancio Poisson, MD as Consulting Physician (Dermatology) Gaspar Cola, [facility] (Inactive) as Pharmacist (Pharmacist)   Medications: Outpatient Medications Prior to Visit  Medication Sig   Acetaminophen (TYLENOL ARTHRITIS PAIN PO) Take 650 mg by mouth every 8 (eight) hours as needed.   benazepril (LOTENSIN) 20 MG tablet TAKE 1 TABLET BY MOUTH TWICE  DAILY   Cranberry 500 MG CAPS Take 500 mg by mouth daily.   dabigatran (PRADAXA) 150 MG CAPS capsule TAKE 1 CAPSULE BY MOUTH TWICE  DAILY   DILT-XR 240 MG 24 hr capsule TAKE 1 CAPSULE BY MOUTH ONCE  DAILY   furosemide (LASIX) 20 MG tablet Take 20 mg daily if needed for swelling   Garlic 1000 MG CAPS Take 1 capsule by mouth daily.   hydrALAZINE (APRESOLINE) 25 MG tablet TAKE 1 TABLET BY MOUTH 3 TIMES  DAILY   MAGNESIUM PO Take 250 mg by mouth daily.   metoprolol tartrate (LOPRESSOR) 50 MG tablet TAKE 1 TABLET BY MOUTH TWICE  DAILY   Multiple Vitamin (MULTIVITAMIN) tablet Take 1 tablet by mouth daily.   pantoprazole (PROTONIX) 20 MG tablet TAKE 1 TABLET BY MOUTH DAILY   potassium chloride SA (KLOR-CON M) 20 MEQ tablet TAKE 1 TABLET BY MOUTH DAILY   sildenafil (VIAGRA) 100  MG tablet Take 100 mg by mouth daily as needed for erectile dysfunction.   tamsulosin (FLOMAX) 0.4 MG CAPS capsule Take 0.4 mg by mouth daily after supper.    No facility-administered medications prior to visit.    Review of Systems  Constitutional:  Negative for activity change, appetite change and unexpected weight change.  Respiratory: Negative.    Cardiovascular: Negative.   Gastrointestinal: Negative.   Endocrine: Negative for cold intolerance and heat intolerance.  Genitourinary: Negative.   Musculoskeletal: Negative.   Skin: Negative.   Neurological:  Positive for tremors. Negative for dizziness, facial asymmetry and speech difficulty.  Hematological: Negative.   Psychiatric/Behavioral:  Negative for behavioral problems, decreased concentration, dysphoric mood, hallucinations, self-injury, sleep disturbance and suicidal ideas. The patient is not nervous/anxious.         Objective:  BP 130/80   Pulse 85   Temp 98 F (36.7 C)   Ht 6\' 2"  (1.88 m)   Wt 202 lb 9.6 oz (91.9 kg)   SpO2 98%   BMI 26.01 kg/m     Physical Exam Vitals and nursing note reviewed.  Constitutional:      General: He is not in acute distress. HENT:     Right Ear: Tympanic membrane, ear canal and external ear normal.     Left Ear: Tympanic membrane, ear canal and external ear normal.     Nose: Nose normal.  Eyes:     Extraocular Movements: Extraocular movements intact.     Conjunctiva/sclera: Conjunctivae normal.     Pupils: Pupils are equal, round, and reactive to light.  Neck:     Thyroid: No thyroid mass, thyromegaly or thyroid tenderness.  Cardiovascular:     Rate and Rhythm: Normal rate and regular rhythm.     Pulses: Normal pulses.  Heart sounds: Normal heart sounds.  Pulmonary:     Effort: Pulmonary effort is normal.     Breath sounds: Normal breath sounds.  Abdominal:     General: Bowel sounds are normal.     Palpations: Abdomen is soft.  Musculoskeletal:        General:  Normal range of motion.     Cervical back: Normal range of motion and neck supple.     Right lower leg: No edema.     Left lower leg: No edema.  Lymphadenopathy:     Cervical: No cervical adenopathy.  Skin:    General: Skin is warm and dry.  Neurological:     Mental Status: He is alert and oriented to person, place, and time.     Cranial Nerves: No cranial nerve deficit.     Motor: Tremor present. No weakness, atrophy or pronator drift.     Coordination: Finger-Nose-Finger Test and Heel to [place] Test normal.     Gait: Gait is intact. Gait and tandem walk normal.  Psychiatric:        Mood and Affect: Mood normal.        Behavior: Behavior normal.        Thought Content: Thought content normal.     No results found for any visits on 05/10/23.    Assessment & Plan:    Routine Health Maintenance and Physical Exam  Immunization History  Administered Date(s) Administered   Fluad Quad(high Dose 65+) 04/24/2021, 04/25/2022   Influenza Split 05/13/2012   Influenza Whole 04/06/2010   Influenza, High Dose Seasonal PF 06/12/2016, 04/18/2017, 04/17/2018, 03/26/2019, 05/08/2023   Influenza,inj,Quad PF,6+ Mos 05/13/2013, 05/18/2014, 05/20/2015   Influenza-Unspecified 06/19/2018, 04/18/2020   PFIZER(Purple Top)SARS-COV-2 Vaccination 08/26/2019, 09/16/2019   Pfizer Covid-19 Vaccine Bivalent Booster 53yrs & up 04/30/2020, 11/07/2020   Pneumococcal Conjugate-13 05/18/2014   Pneumococcal Polysaccharide-23 05/20/2006   Tdap 06/14/2016   Zoster, Live 08/09/2008   Health Maintenance  Topic Date Due   Zoster Vaccines- Shingrix (1 of 2) 01/09/1959   COVID-19 Vaccine (5 - 2023-24 season) 04/07/2023   Medicare Annual Wellness (AWV)  05/02/2024   DTaP/Tdap/Td (2 - Td or Tdap) 06/14/2026   Pneumonia Vaccine 10+ Years old  Completed   INFLUENZA VACCINE  Completed   HPV VACCINES  Aged Out   Discussed health benefits of physical activity, and encouraged him to engage in regular exercise appropriate  for his age and condition. Advised to get COVID and shingrix vaccine 2weeks apart.  Problem List Items Addressed This Visit     CKD (chronic kidney disease), stage III (HCC)    Repeat CMP      Decreased pedal pulses    Normal ABI 08/2022 Denies any pain or swelling or paresthesia or change in gait      Essential hypertension - Primary    BP at goal Managed by cardiology with hydralazine, furosemide, benazepril, and metoprolol BP Readings from Last 3 Encounters:  05/10/23 130/80  02/11/23 (!) 150/68  04/25/22 130/70   Check CMP      Essential tremor    Resting Tremor of chin, intention tremor in left arm, Tremor in voice Mrs.Konen denies any dysphagia or change in gait or mood. He denies any falls      Other Visit Diagnoses     Prostate cancer screening       Relevant Orders   PSA, Medicare   Encounter for preventative adult health care exam with abnormal findings       Relevant  Orders   Comprehensive metabolic panel   Encounter for lipid screening for cardiovascular disease       Relevant Orders   Lipid panel      Return in about 6 months (around 11/08/2023) for HTN.     Alysia Penna, NP

## 2023-05-11 LAB — LIPID PANEL
Cholesterol: 112 mg/dL (ref <200)
HDL: 45 mg/dL (ref >=40)
LDL Cholesterol (Calc): 50 mg/dL
Non-HDL Cholesterol (Calc): 67 mg/dL (ref <130)
Total CHOL/HDL Ratio: 2.5 (calc) (ref <5.0)
Triglycerides: 87 mg/dL (ref <150)

## 2023-05-11 LAB — COMPREHENSIVE METABOLIC PANEL
AG Ratio: 1.7 (calc) (ref 1.0–2.5)
ALT: 10 U/L (ref 9–46)
AST: 14 U/L (ref 10–35)
Albumin: 4.3 g/dL (ref 3.6–5.1)
Alkaline phosphatase (APISO): 86 U/L (ref 35–144)
BUN/Creatinine Ratio: 17 (calc) (ref 6–22)
BUN: 24 mg/dL (ref 7–25)
CO2: 24 mmol/L (ref 20–32)
Calcium: 9.5 mg/dL (ref 8.6–10.3)
Chloride: 106 mmol/L (ref 98–110)
Creat: 1.39 mg/dL — ABNORMAL HIGH (ref 0.70–1.22)
Globulin: 2.5 g/dL (ref 1.9–3.7)
Glucose, Bld: 90 mg/dL (ref 65–99)
Potassium: 4.6 mmol/L (ref 3.5–5.3)
Sodium: 142 mmol/L (ref 135–146)
Total Bilirubin: 1.1 mg/dL (ref 0.2–1.2)
Total Protein: 6.8 g/dL (ref 6.1–8.1)

## 2023-05-11 LAB — PSA: PSA: 3.61 ng/mL (ref <=4.00)

## 2023-05-21 ENCOUNTER — Other Ambulatory Visit: Payer: Self-pay | Admitting: Cardiology

## 2023-05-21 DIAGNOSIS — I482 Chronic atrial fibrillation, unspecified: Secondary | ICD-10-CM

## 2023-05-23 DIAGNOSIS — L570 Actinic keratosis: Secondary | ICD-10-CM | POA: Diagnosis not present

## 2023-05-23 DIAGNOSIS — C44629 Squamous cell carcinoma of skin of left upper limb, including shoulder: Secondary | ICD-10-CM | POA: Diagnosis not present

## 2023-05-23 DIAGNOSIS — L821 Other seborrheic keratosis: Secondary | ICD-10-CM | POA: Diagnosis not present

## 2023-05-23 DIAGNOSIS — Z85828 Personal history of other malignant neoplasm of skin: Secondary | ICD-10-CM | POA: Diagnosis not present

## 2023-05-23 DIAGNOSIS — D1801 Hemangioma of skin and subcutaneous tissue: Secondary | ICD-10-CM | POA: Diagnosis not present

## 2023-05-23 DIAGNOSIS — L720 Epidermal cyst: Secondary | ICD-10-CM | POA: Diagnosis not present

## 2023-06-11 ENCOUNTER — Other Ambulatory Visit: Payer: Self-pay | Admitting: Cardiology

## 2023-06-11 DIAGNOSIS — I10 Essential (primary) hypertension: Secondary | ICD-10-CM

## 2023-07-12 ENCOUNTER — Other Ambulatory Visit: Payer: Self-pay | Admitting: Cardiology

## 2023-08-03 ENCOUNTER — Other Ambulatory Visit: Payer: Self-pay | Admitting: Cardiology

## 2023-08-05 NOTE — Telephone Encounter (Signed)
Prescription refill request for Pradaxa received.  Indication:afib Last office visit:7/24 Weight:91.9  kg Age:82 Scr:1.39  10/24 CrCl:52.34  ml/min  Prescription refilled

## 2023-08-28 ENCOUNTER — Other Ambulatory Visit (HOSPITAL_COMMUNITY): Payer: Self-pay

## 2023-09-11 ENCOUNTER — Telehealth: Payer: Self-pay | Admitting: Pharmacy Technician

## 2023-09-11 ENCOUNTER — Other Ambulatory Visit (HOSPITAL_COMMUNITY): Payer: Self-pay

## 2023-09-11 NOTE — Telephone Encounter (Signed)
 Pharmacy Patient Advocate Encounter   Received notification from Fax that prior authorization for Dabigatran  Etexilate Mesylate 150MG  capsules is required/requested.   Insurance verification completed.   The patient is insured through [employer] .   Per test claim: PA required; PA submitted to above mentioned insurance via CoverMyMeds Key/confirmation #/EOC [id number] Status is pending

## 2023-09-12 ENCOUNTER — Telehealth: Payer: Self-pay | Admitting: Pharmacy Technician

## 2023-09-12 NOTE — Telephone Encounter (Signed)
 Pharmacy Patient Advocate Encounter  Received notification from [facility] that Prior Authorization for Dabigatran  Etexilate Mesylate 150MG  capsules  has been DENIED.  I submitted for an appeal see new encounter from 09/12/23   PA #/Case ID/Reference #: [id number]

## 2023-09-12 NOTE — Telephone Encounter (Signed)
 Pa was denied. I started an appeal under  [id number] ref number

## 2023-09-19 ENCOUNTER — Other Ambulatory Visit (HOSPITAL_COMMUNITY): Payer: Self-pay

## 2023-09-19 NOTE — Telephone Encounter (Signed)
Pharmacy Patient Advocate Encounter  Received notification from [facility] that Prior Authorization for Dabigatran Etexilate Mesylate 150MG  capsules  has been APPROVED from 09/19/23 to 08/05/24. Ran test claim, Copay is $110.05. This test claim was processed through [facility]- copay amounts may vary at other pharmacies due to pharmacy/plan contracts, or as the patient moves through the different stages of their insurance plan.

## 2023-09-19 NOTE — Telephone Encounter (Signed)
Called pt to let him know about the approval. No answer, left detailed message on the machine per DPR.

## 2023-09-20 MED ORDER — DABIGATRAN ETEXILATE MESYLATE 150 MG PO CAPS: 150.0000 mg | ORAL_CAPSULE | Freq: Two times a day (BID) | ORAL | 3 refills | Status: DC

## 2023-09-20 NOTE — Addendum Note (Signed)
Addended by: Neoma Laming on: 09/20/2023 01:50 PM   Modules accepted: Orders

## 2023-09-20 NOTE — Telephone Encounter (Signed)
Patient returning call.  He wants to know if medication is at [facility], he states that is what was said on the message and he wants to clarify.

## 2023-09-20 NOTE — Telephone Encounter (Signed)
Spoke to patient Pradaxa was approved.Prescription sent to [facility].

## 2023-10-22 ENCOUNTER — Other Ambulatory Visit: Payer: Self-pay | Admitting: Cardiology

## 2023-11-08 ENCOUNTER — Encounter: Payer: Self-pay | Admitting: Nurse Practitioner

## 2023-11-08 ENCOUNTER — Ambulatory Visit: Payer: Medicare Other | Admitting: Nurse Practitioner

## 2023-11-08 VITALS — BP 130/70 | HR 70 | Temp 98.0°F | Ht 74.0 in | Wt 201.8 lb

## 2023-11-08 DIAGNOSIS — I482 Chronic atrial fibrillation, unspecified: Secondary | ICD-10-CM | POA: Diagnosis not present

## 2023-11-08 DIAGNOSIS — G250 Essential tremor: Secondary | ICD-10-CM | POA: Diagnosis not present

## 2023-11-08 DIAGNOSIS — I10 Essential (primary) hypertension: Secondary | ICD-10-CM

## 2023-11-08 DIAGNOSIS — N1831 Chronic kidney disease, stage 3a: Secondary | ICD-10-CM | POA: Diagnosis not present

## 2023-11-08 LAB — CBC
HCT: 40.8 % (ref 39.0–52.0)
Hemoglobin: 13.6 g/dL (ref 13.0–17.0)
MCHC: 33.3 g/dL (ref 30.0–36.0)
MCV: 94.2 fl (ref 78.0–100.0)
Platelets: 144 10*3/uL — ABNORMAL LOW (ref 150.0–400.0)
RBC: 4.33 Mil/uL (ref 4.22–5.81)
RDW: 15.6 % — ABNORMAL HIGH (ref 11.5–15.5)
WBC: 5.5 10*3/uL (ref 4.0–10.5)

## 2023-11-08 LAB — BASIC METABOLIC PANEL WITH GFR
BUN: 20 mg/dL (ref 6–23)
CO2: 28 meq/L (ref 19–32)
Calcium: 9.4 mg/dL (ref 8.4–10.5)
Chloride: 106 meq/L (ref 96–112)
Creatinine, Ser: 1.29 mg/dL (ref 0.40–1.50)
GFR: 51.16 mL/min — ABNORMAL LOW (ref >60.00)
Glucose, Bld: 97 mg/dL (ref 70–99)
Potassium: 4.2 meq/L (ref 3.5–5.1)
Sodium: 144 meq/L (ref 135–145)

## 2023-11-08 LAB — TSH: TSH: 2.38 u[IU]/mL (ref 0.35–5.50)

## 2023-11-08 NOTE — Progress Notes (Addendum)
 Established Patient Visit  Patient: [name]   DOB: 03-16-1941   83 y.o. Male  MRN: [id number] Visit Date: 11/08/2023  Subjective:    Chief Complaint  Patient presents with   Follow-up    6 month f/u   HPI Essential tremor Less tremor noted today Normal coordination and gait  Essential hypertension BP at goal Managed by cardiology with hydralazine, furosemide, benazepril, and metoprolol BP Readings from Last 3 Encounters:  11/08/23 130/70  05/10/23 130/80  02/11/23 (!) 150/68   Check BMP  CKD (chronic kidney disease), stage III Repeat BMP BP at goal LDL at goal No hematuria No hx of DM  Chronic atrial fibrillation (HCC) Rate controlled with metoprolol Current use of pradaxa Denies any GI/GU bleed or bruising Under the care of cardiology  BP Readings from Last 3 Encounters:  11/08/23 130/70  05/10/23 130/80  02/11/23 (!) 150/68    Wt Readings from Last 3 Encounters:  11/08/23 201 lb 12.8 oz (91.5 kg)  05/10/23 202 lb 9.6 oz (91.9 kg)  02/11/23 208 lb 12.8 oz (94.7 kg)     Mini-Cog - 11/08/23 1538     Normal clock drawing test? yes    How many words correct? 3                11/08/2023    3:45 PM 04/24/2021   10:03 AM 06/21/2017    9:11 AM  MMSE - Mini Mental State Exam  Orientation to time 5 5 5   Orientation to time comments Patient able to tell me year date day month and year    Orientation to Place 5 5 5   Orientation to Place-comments [place]    Registration 3 3 3   Registration-comments Banana, sunrise, chair    Attention/ Calculation 2 5 5   Attention/Calculation-comments mixed up letters when spelling world backwards "dolrw"    Recall 3 3 3   Language- name 2 objects 2 2 2   Language- name 2 objects-comments While on the phone with patient I asked what do you wear on your wrist and name something that you write with    Language- repeat 1 1 1   Language- follow 3 step command 3  3 3   Language- follow 3 step command-comments had patient stand up, jump once and then sit down    Language- read & follow direction 1 1 1   Language-read & follow direction-comments Had patient's wife to write "Close your eyes"    Write a sentence 1 1 1   Write a sentence-comments Patient used the recall words from Mini cog test to tell me a sentence    Copy design  1 1  Total score  30 30    Reviewed medical, surgical, and social history today  Medications: Outpatient Medications Prior to Visit  Medication Sig   Acetaminophen (TYLENOL ARTHRITIS PAIN PO) Take 650 mg by mouth every 8 (eight) hours as needed.   benazepril (LOTENSIN) 20 MG tablet TAKE 1 TABLET BY MOUTH TWICE  DAILY   Cranberry 500 MG CAPS Take 500 mg by mouth daily.   dabigatran (PRADAXA) 150 MG CAPS capsule Take 1 capsule (150 mg total) by mouth 2 (two) times daily.   diltiazem (DILT-XR) 240 MG 24 hr capsule TAKE 1 CAPSULE BY MOUTH ONCE  DAILY   furosemide (LASIX) 20 MG tablet Take 20 mg daily if needed for swelling  Garlic 1000 MG CAPS Take 1 capsule by mouth daily.   hydrALAZINE (APRESOLINE) 25 MG tablet TAKE 1 TABLET BY MOUTH 3 TIMES  DAILY   MAGNESIUM PO Take 250 mg by mouth daily.   metoprolol tartrate (LOPRESSOR) 50 MG tablet TAKE 1 TABLET BY MOUTH TWICE  DAILY   Multiple Vitamin (MULTIVITAMIN) tablet Take 1 tablet by mouth daily.   pantoprazole (PROTONIX) 20 MG tablet TAKE 1 TABLET BY MOUTH DAILY   potassium chloride SA (KLOR-CON M) 20 MEQ tablet TAKE 1 TABLET BY MOUTH DAILY   sildenafil (VIAGRA) 100 MG tablet Take 100 mg by mouth daily as needed for erectile dysfunction.   tamsulosin (FLOMAX) 0.4 MG CAPS capsule Take 0.4 mg by mouth daily after supper.    No facility-administered medications prior to visit.   Reviewed past medical and social history.   ROS per HPI above      Objective:  BP 130/70 (BP Location: Left Arm, Patient Position: Sitting, Cuff Size: Normal)   Pulse 70   Temp 98 F (36.7 C)  (Temporal)   Ht 6\' 2"  (1.88 m)   Wt 201 lb 12.8 oz (91.5 kg)   SpO2 99%   BMI 25.91 kg/m      Physical Exam Vitals and nursing note reviewed.  Cardiovascular:     Rate and Rhythm: Normal rate and regular rhythm.     Pulses: Normal pulses.     Heart sounds: Normal heart sounds.  Pulmonary:     Effort: Pulmonary effort is normal.     Breath sounds: Normal breath sounds.  Neurological:     Mental Status: He is alert and oriented to person, place, and time.     Cranial Nerves: Cranial nerves 2-12 are intact.     Motor: Motor function is intact.     Coordination: Coordination is intact.     Gait: Gait is intact.     No results found for any visits on 11/08/23.    Assessment & Plan:    Problem List Items Addressed This Visit     Chronic atrial fibrillation (HCC)   Rate controlled with metoprolol Current use of pradaxa Denies any GI/GU bleed or bruising Under the care of cardiology      CKD (chronic kidney disease), stage III (HCC)   Repeat BMP BP at goal LDL at goal No hematuria No hx of DM      Relevant Orders   Basic metabolic panel with GFR   CBC   Essential hypertension - Primary   BP at goal Managed by cardiology with hydralazine, furosemide, benazepril, and metoprolol BP Readings from Last 3 Encounters:  11/08/23 130/70  05/10/23 130/80  02/11/23 (!) 150/68   Check BMP      Relevant Orders   Basic metabolic panel with GFR   TSH   Essential tremor   Less tremor noted today Normal coordination and gait      Return in about 6 months (around 05/09/2024) for HTN, hyperlipidemia (fasting).     Alysia Penna, NP

## 2023-11-08 NOTE — Assessment & Plan Note (Signed)
 Rate controlled with metoprolol Current use of pradaxa Denies any GI/GU bleed or bruising Under the care of cardiology

## 2023-11-08 NOTE — Assessment & Plan Note (Signed)
 Less tremor noted today Normal coordination and gait

## 2023-11-08 NOTE — Assessment & Plan Note (Signed)
 BP at goal Managed by cardiology with hydralazine, furosemide, benazepril, and metoprolol BP Readings from Last 3 Encounters:  11/08/23 130/70  05/10/23 130/80  02/11/23 (!) 150/68   Check BMP

## 2023-11-08 NOTE — Patient Instructions (Addendum)
 Ok to get shingrix vaccine at [facility]. Go to lab Maintain Heart healthy diet and daily exercise. Maintain current medications.

## 2023-11-08 NOTE — Assessment & Plan Note (Addendum)
 Repeat BMP BP at goal LDL at goal No hematuria No hx of DM

## 2023-11-10 ENCOUNTER — Encounter: Payer: Self-pay | Admitting: Nurse Practitioner

## 2023-11-25 ENCOUNTER — Other Ambulatory Visit: Payer: Self-pay | Admitting: Cardiology

## 2023-11-25 ENCOUNTER — Other Ambulatory Visit: Payer: Self-pay | Admitting: Nurse Practitioner

## 2023-11-25 DIAGNOSIS — I10 Essential (primary) hypertension: Secondary | ICD-10-CM

## 2023-11-25 DIAGNOSIS — Z7901 Long term (current) use of anticoagulants: Secondary | ICD-10-CM

## 2023-11-26 ENCOUNTER — Other Ambulatory Visit: Payer: Self-pay

## 2023-11-26 DIAGNOSIS — L723 Sebaceous cyst: Secondary | ICD-10-CM | POA: Diagnosis not present

## 2023-11-26 DIAGNOSIS — L570 Actinic keratosis: Secondary | ICD-10-CM | POA: Diagnosis not present

## 2023-11-26 DIAGNOSIS — C44311 Basal cell carcinoma of skin of nose: Secondary | ICD-10-CM | POA: Diagnosis not present

## 2023-11-26 DIAGNOSIS — D225 Melanocytic nevi of trunk: Secondary | ICD-10-CM | POA: Diagnosis not present

## 2023-11-26 DIAGNOSIS — Z85828 Personal history of other malignant neoplasm of skin: Secondary | ICD-10-CM | POA: Diagnosis not present

## 2023-11-26 DIAGNOSIS — L821 Other seborrheic keratosis: Secondary | ICD-10-CM | POA: Diagnosis not present

## 2023-11-26 DIAGNOSIS — Z7901 Long term (current) use of anticoagulants: Secondary | ICD-10-CM

## 2023-11-26 MED ORDER — PANTOPRAZOLE SODIUM 20 MG PO TBEC: 20.0000 mg | DELAYED_RELEASE_TABLET | Freq: Every day | ORAL | 2 refills | Status: DC

## 2023-12-19 ENCOUNTER — Other Ambulatory Visit: Payer: Self-pay | Admitting: Cardiology

## 2023-12-24 ENCOUNTER — Telehealth: Payer: Self-pay | Admitting: Cardiology

## 2023-12-24 DIAGNOSIS — I482 Chronic atrial fibrillation, unspecified: Secondary | ICD-10-CM

## 2023-12-24 MED ORDER — DABIGATRAN ETEXILATE MESYLATE 150 MG PO CAPS: 150.0000 mg | ORAL_CAPSULE | Freq: Two times a day (BID) | ORAL | 0 refills | Status: DC

## 2023-12-24 NOTE — Telephone Encounter (Signed)
*  STAT* If patient is at the pharmacy, call can be transferred to refill team.   1. Which medications need to be refilled? (please list name of each medication and dose if known)   dabigatran  (PRADAXA ) 150 MG CAPS capsule     2. Would you like to learn more about the convenience, safety, & potential cost savings by using the [facility]? No   3. Are you open to using the [facility] (Type [facility].) No   4. Which pharmacy/location (including street and city if local pharmacy) is medication to be sent to? [facility] - [place] - [address]    5. Do they need a 30 day or 90 day supply? 90 day

## 2023-12-31 ENCOUNTER — Other Ambulatory Visit: Payer: Self-pay | Admitting: Cardiology

## 2024-01-05 ENCOUNTER — Other Ambulatory Visit: Payer: Self-pay | Admitting: Cardiology

## 2024-01-05 DIAGNOSIS — I482 Chronic atrial fibrillation, unspecified: Secondary | ICD-10-CM

## 2024-01-11 DIAGNOSIS — H524 Presbyopia: Secondary | ICD-10-CM | POA: Diagnosis not present

## 2024-01-30 ENCOUNTER — Other Ambulatory Visit: Payer: Self-pay | Admitting: Cardiology

## 2024-01-30 DIAGNOSIS — I10 Essential (primary) hypertension: Secondary | ICD-10-CM

## 2024-01-30 DIAGNOSIS — I482 Chronic atrial fibrillation, unspecified: Secondary | ICD-10-CM

## 2024-02-02 ENCOUNTER — Other Ambulatory Visit: Payer: Self-pay | Admitting: Cardiology

## 2024-02-02 DIAGNOSIS — I482 Chronic atrial fibrillation, unspecified: Secondary | ICD-10-CM

## 2024-02-09 ENCOUNTER — Emergency Department (HOSPITAL_COMMUNITY)

## 2024-02-09 ENCOUNTER — Inpatient Hospital Stay (HOSPITAL_COMMUNITY)
Admission: EM | Admit: 2024-02-09 | Discharge: 2024-03-06 | DRG: 951 | Disposition: E | Source: Intra-hospital | Attending: Internal Medicine | Admitting: Internal Medicine

## 2024-02-09 ENCOUNTER — Encounter (HOSPITAL_COMMUNITY): Payer: Self-pay

## 2024-02-09 ENCOUNTER — Other Ambulatory Visit: Payer: Self-pay

## 2024-02-09 DIAGNOSIS — R000 Tachycardia, unspecified: Secondary | ICD-10-CM | POA: Diagnosis not present

## 2024-02-09 DIAGNOSIS — H53462 Homonymous bilateral field defects, left side: Secondary | ICD-10-CM | POA: Diagnosis present

## 2024-02-09 DIAGNOSIS — Z881 Allergy status to other antibiotic agents status: Secondary | ICD-10-CM

## 2024-02-09 DIAGNOSIS — I10 Essential (primary) hypertension: Secondary | ICD-10-CM

## 2024-02-09 DIAGNOSIS — R9082 White matter disease, unspecified: Secondary | ICD-10-CM | POA: Diagnosis not present

## 2024-02-09 DIAGNOSIS — G936 Cerebral edema: Secondary | ICD-10-CM | POA: Diagnosis not present

## 2024-02-09 DIAGNOSIS — T45515A Adverse effect of anticoagulants, initial encounter: Secondary | ICD-10-CM

## 2024-02-09 DIAGNOSIS — I129 Hypertensive chronic kidney disease with stage 1 through stage 4 chronic kidney disease, or unspecified chronic kidney disease: Secondary | ICD-10-CM | POA: Diagnosis present

## 2024-02-09 DIAGNOSIS — I615 Nontraumatic intracerebral hemorrhage, intraventricular: Secondary | ICD-10-CM | POA: Diagnosis not present

## 2024-02-09 DIAGNOSIS — G8194 Hemiplegia, unspecified affecting left nondominant side: Secondary | ICD-10-CM | POA: Diagnosis present

## 2024-02-09 DIAGNOSIS — I639 Cerebral infarction, unspecified: Secondary | ICD-10-CM | POA: Diagnosis not present

## 2024-02-09 DIAGNOSIS — Z833 Family history of diabetes mellitus: Secondary | ICD-10-CM | POA: Diagnosis not present

## 2024-02-09 DIAGNOSIS — Z85828 Personal history of other malignant neoplasm of skin: Secondary | ICD-10-CM

## 2024-02-09 DIAGNOSIS — R29810 Facial weakness: Secondary | ICD-10-CM | POA: Diagnosis not present

## 2024-02-09 DIAGNOSIS — I482 Chronic atrial fibrillation, unspecified: Secondary | ICD-10-CM | POA: Diagnosis present

## 2024-02-09 DIAGNOSIS — I672 Cerebral atherosclerosis: Secondary | ICD-10-CM | POA: Diagnosis not present

## 2024-02-09 DIAGNOSIS — W19XXXA Unspecified fall, initial encounter: Secondary | ICD-10-CM | POA: Diagnosis not present

## 2024-02-09 DIAGNOSIS — Z8052 Family history of malignant neoplasm of bladder: Secondary | ICD-10-CM | POA: Diagnosis not present

## 2024-02-09 DIAGNOSIS — I629 Nontraumatic intracranial hemorrhage, unspecified: Secondary | ICD-10-CM

## 2024-02-09 DIAGNOSIS — Z825 Family history of asthma and other chronic lower respiratory diseases: Secondary | ICD-10-CM

## 2024-02-09 DIAGNOSIS — I6502 Occlusion and stenosis of left vertebral artery: Secondary | ICD-10-CM | POA: Diagnosis not present

## 2024-02-09 DIAGNOSIS — Z87891 Personal history of nicotine dependence: Secondary | ICD-10-CM | POA: Diagnosis not present

## 2024-02-09 DIAGNOSIS — Z7189 Other specified counseling: Secondary | ICD-10-CM | POA: Diagnosis not present

## 2024-02-09 DIAGNOSIS — Y92002 Bathroom of unspecified non-institutional (private) residence single-family (private) house as the place of occurrence of the external cause: Secondary | ICD-10-CM | POA: Diagnosis not present

## 2024-02-09 DIAGNOSIS — Z7901 Long term (current) use of anticoagulants: Secondary | ICD-10-CM

## 2024-02-09 DIAGNOSIS — R414 Neurologic neglect syndrome: Secondary | ICD-10-CM | POA: Diagnosis present

## 2024-02-09 DIAGNOSIS — Y92009 Unspecified place in unspecified non-institutional (private) residence as the place of occurrence of the external cause: Secondary | ICD-10-CM

## 2024-02-09 DIAGNOSIS — Z79899 Other long term (current) drug therapy: Secondary | ICD-10-CM

## 2024-02-09 DIAGNOSIS — D696 Thrombocytopenia, unspecified: Secondary | ICD-10-CM | POA: Diagnosis not present

## 2024-02-09 DIAGNOSIS — N183 Chronic kidney disease, stage 3 unspecified: Secondary | ICD-10-CM | POA: Diagnosis not present

## 2024-02-09 DIAGNOSIS — R9089 Other abnormal findings on diagnostic imaging of central nervous system: Secondary | ICD-10-CM | POA: Diagnosis not present

## 2024-02-09 DIAGNOSIS — D329 Benign neoplasm of meninges, unspecified: Secondary | ICD-10-CM | POA: Diagnosis not present

## 2024-02-09 DIAGNOSIS — I609 Nontraumatic subarachnoid hemorrhage, unspecified: Secondary | ICD-10-CM | POA: Diagnosis not present

## 2024-02-09 DIAGNOSIS — W1839XA Other fall on same level, initial encounter: Secondary | ICD-10-CM | POA: Diagnosis present

## 2024-02-09 DIAGNOSIS — Z8249 Family history of ischemic heart disease and other diseases of the circulatory system: Secondary | ICD-10-CM | POA: Diagnosis not present

## 2024-02-09 DIAGNOSIS — Z66 Do not resuscitate: Secondary | ICD-10-CM | POA: Diagnosis present

## 2024-02-09 DIAGNOSIS — I610 Nontraumatic intracerebral hemorrhage in hemisphere, subcortical: Secondary | ICD-10-CM | POA: Diagnosis not present

## 2024-02-09 DIAGNOSIS — I619 Nontraumatic intracerebral hemorrhage, unspecified: Principal | ICD-10-CM

## 2024-02-09 DIAGNOSIS — Z515 Encounter for palliative care: Secondary | ICD-10-CM | POA: Diagnosis not present

## 2024-02-09 DIAGNOSIS — G935 Compression of brain: Secondary | ICD-10-CM | POA: Diagnosis not present

## 2024-02-09 DIAGNOSIS — Z800 Family history of malignant neoplasm of digestive organs: Secondary | ICD-10-CM | POA: Diagnosis not present

## 2024-02-09 DIAGNOSIS — I161 Hypertensive emergency: Secondary | ICD-10-CM | POA: Diagnosis not present

## 2024-02-09 DIAGNOSIS — R404 Transient alteration of awareness: Secondary | ICD-10-CM | POA: Diagnosis not present

## 2024-02-09 DIAGNOSIS — R531 Weakness: Secondary | ICD-10-CM | POA: Diagnosis present

## 2024-02-09 DIAGNOSIS — D320 Benign neoplasm of cerebral meninges: Secondary | ICD-10-CM | POA: Diagnosis not present

## 2024-02-09 LAB — TYPE AND SCREEN
ABO/RH(D): O POS
Antibody Screen: NEGATIVE

## 2024-02-09 LAB — I-STAT CHEM 8, ED
BUN: 23 mg/dL (ref 8–23)
Calcium, Ion: 1.13 mmol/L — ABNORMAL LOW (ref 1.15–1.40)
Chloride: 106 mmol/L (ref 98–111)
Creatinine, Ser: 1.2 mg/dL (ref 0.61–1.24)
Glucose, Bld: 133 mg/dL — ABNORMAL HIGH (ref 70–99)
HCT: 45 % (ref 39.0–52.0)
Hemoglobin: 15.3 g/dL (ref 13.0–17.0)
Potassium: 3.5 mmol/L (ref 3.5–5.1)
Sodium: 141 mmol/L (ref 135–145)
TCO2: 24 mmol/L (ref 22–32)

## 2024-02-09 LAB — CBC
HCT: 43.1 % (ref 39.0–52.0)
Hemoglobin: 14.6 g/dL (ref 13.0–17.0)
MCH: 31.9 pg (ref 26.0–34.0)
MCHC: 33.9 g/dL (ref 30.0–36.0)
MCV: 94.3 fL (ref 80.0–100.0)
Platelets: 123 K/uL — ABNORMAL LOW (ref 150–400)
RBC: 4.57 MIL/uL (ref 4.22–5.81)
RDW: 14.1 % (ref 11.5–15.5)
WBC: 7.9 K/uL (ref 4.0–10.5)
nRBC: 0 % (ref 0.0–0.2)

## 2024-02-09 LAB — ETHANOL: Alcohol, Ethyl (B): <15 mg/dL (ref <15)

## 2024-02-09 LAB — COMPREHENSIVE METABOLIC PANEL WITH GFR
ALT: 16 U/L (ref 0–44)
AST: 25 U/L (ref 15–41)
Albumin: 4.4 g/dL (ref 3.5–5.0)
Alkaline Phosphatase: 77 U/L (ref 38–126)
Anion gap: 12 (ref 5–15)
BUN: 22 mg/dL (ref 8–23)
CO2: 25 mmol/L (ref 22–32)
Calcium: 9.5 mg/dL (ref 8.9–10.3)
Chloride: 103 mmol/L (ref 98–111)
Creatinine, Ser: 1.23 mg/dL (ref 0.61–1.24)
GFR, Estimated: 58 mL/min — ABNORMAL LOW (ref >60)
Glucose, Bld: 135 mg/dL — ABNORMAL HIGH (ref 70–99)
Potassium: 3.5 mmol/L (ref 3.5–5.1)
Sodium: 140 mmol/L (ref 135–145)
Total Bilirubin: 1.5 mg/dL — ABNORMAL HIGH (ref 0.0–1.2)
Total Protein: 7.6 g/dL (ref 6.5–8.1)

## 2024-02-09 LAB — DIFFERENTIAL
Abs Immature Granulocytes: 0.03 K/uL (ref 0.00–0.07)
Basophils Absolute: 0 K/uL (ref 0.0–0.1)
Basophils Relative: 0 %
Eosinophils Absolute: 0 K/uL (ref 0.0–0.5)
Eosinophils Relative: 0 %
Immature Granulocytes: 0 %
Lymphocytes Relative: 9 %
Lymphs Abs: 0.7 K/uL (ref 0.7–4.0)
Monocytes Absolute: 0.3 K/uL (ref 0.1–1.0)
Monocytes Relative: 4 %
Neutro Abs: 6.8 K/uL (ref 1.7–7.7)
Neutrophils Relative %: 87 %

## 2024-02-09 LAB — CBG MONITORING, ED: Glucose-Capillary: 135 mg/dL — ABNORMAL HIGH (ref 70–99)

## 2024-02-09 LAB — PROTIME-INR
INR: 1.4 — ABNORMAL HIGH (ref 0.8–1.2)
Prothrombin Time: 17.8 s — ABNORMAL HIGH (ref 11.4–15.2)

## 2024-02-09 LAB — APTT: aPTT: 54 s — ABNORMAL HIGH (ref 24–36)

## 2024-02-09 MED ORDER — MORPHINE SULFATE (PF) 2 MG/ML IV SOLN: 1.0000 mg | INTRAVENOUS | Status: DC | PRN

## 2024-02-09 MED ORDER — ACETAMINOPHEN 325 MG PO TABS: 650.0000 mg | ORAL_TABLET | Freq: Four times a day (QID) | ORAL | Status: DC | PRN

## 2024-02-09 MED ORDER — POLYVINYL ALCOHOL 1.4 % OP SOLN
1.0000 [drp] | Freq: Four times a day (QID) | OPHTHALMIC | Status: DC | PRN
  Filled 2024-02-09: qty 15

## 2024-02-09 MED ORDER — GLYCOPYRROLATE 0.2 MG/ML IJ SOLN: 0.2000 mg | INTRAMUSCULAR | Status: DC | PRN

## 2024-02-09 MED ORDER — HALOPERIDOL 1 MG PO TABS
0.5000 mg | ORAL_TABLET | ORAL | Status: DC | PRN
  Filled 2024-02-09: qty 1

## 2024-02-09 MED ORDER — HALOPERIDOL LACTATE 2 MG/ML PO CONC
0.5000 mg | ORAL | Status: DC | PRN
  Filled 2024-02-09: qty 5

## 2024-02-09 MED ORDER — HALOPERIDOL LACTATE 5 MG/ML IJ SOLN: 0.5000 mg | INTRAMUSCULAR | Status: DC | PRN

## 2024-02-09 MED ORDER — ACETAMINOPHEN 650 MG RE SUPP: 650.0000 mg | Freq: Four times a day (QID) | RECTAL | Status: DC | PRN

## 2024-02-09 MED ORDER — MORPHINE 100MG IN NS 100ML (1MG/ML) PREMIX INFUSION
1.0000 mg/h | INTRAVENOUS | Status: DC
  Administered 2024-02-09: 1 mg/h via INTRAVENOUS
  Filled 2024-02-09: qty 100

## 2024-02-09 MED ORDER — IDARUCIZUMAB 2.5 GM/50ML IV SOLN
5.0000 g | Freq: Once | INTRAVENOUS | Status: DC
  Filled 2024-02-09: qty 100

## 2024-02-09 MED ORDER — LORAZEPAM 2 MG/ML IJ SOLN: 1.0000 mg | INTRAMUSCULAR | Status: DC | PRN

## 2024-02-09 MED ORDER — ONDANSETRON HCL 4 MG/2ML IJ SOLN: 4.0000 mg | Freq: Four times a day (QID) | INTRAMUSCULAR | Status: DC | PRN

## 2024-02-09 MED ORDER — LORAZEPAM 2 MG/ML PO CONC: 1.0000 mg | ORAL | Status: DC | PRN

## 2024-02-09 MED ORDER — ONDANSETRON 4 MG PO TBDP: 4.0000 mg | ORAL_TABLET | Freq: Four times a day (QID) | ORAL | Status: DC | PRN

## 2024-02-09 MED ORDER — SODIUM CHLORIDE 0.9% FLUSH: 3.0000 mL | Freq: Once | INTRAVENOUS | Status: DC

## 2024-02-09 MED ORDER — IOHEXOL 350 MG/ML SOLN
75.0000 mL | Freq: Once | INTRAVENOUS | Status: AC | PRN
  Administered 2024-02-09: 75 mL via INTRAVENOUS

## 2024-02-09 MED ORDER — CLEVIDIPINE BUTYRATE 0.5 MG/ML IV EMUL
0.0000 mg/h | INTRAVENOUS | Status: DC
  Administered 2024-02-09: 2 mg/h via INTRAVENOUS

## 2024-02-09 MED ORDER — IDARUCIZUMAB 2.5 GM/50ML IV SOLN
5.0000 g | INTRAVENOUS | Status: DC
  Filled 2024-02-09: qty 100

## 2024-02-09 MED ORDER — BIOTENE DRY MOUTH MT LIQD: 15.0000 mL | OROMUCOSAL | Status: DC | PRN

## 2024-02-09 MED ORDER — LORAZEPAM 1 MG PO TABS
1.0000 mg | ORAL_TABLET | ORAL | Status: DC | PRN
Start: 2024-02-09 — End: 2024-02-10

## 2024-02-09 MED ORDER — GLYCOPYRROLATE 1 MG PO TABS
1.0000 mg | ORAL_TABLET | ORAL | Status: DC | PRN
  Filled 2024-02-09: qty 1

## 2024-02-09 NOTE — Plan of Care (Signed)
  Problem: Education: Goal: Knowledge of the prescribed therapeutic regimen will improve Outcome: Not Progressing   Problem: Coping: Goal: Ability to identify and develop effective coping behavior will improve Outcome: Not Progressing   Problem: Clinical Measurements: Goal: Quality of life will improve Outcome: Not Progressing   Problem: Respiratory: Goal: Verbalizations of increased ease of respirations will increase Outcome: Not Progressing   Problem: Role Relationship: Goal: Family's ability to cope with current situation will improve Outcome: Not Progressing Goal: Ability to verbalize concerns, feelings, and thoughts to partner or family member will improve Outcome: Not Progressing   Problem: Pain Management: Goal: Satisfaction with pain management regimen will improve Outcome: Not Progressing   Problem: Education: Goal: Knowledge of General Education information will improve Description: Including pain rating scale, medication(s)/side effects and non-pharmacologic comfort measures Outcome: Not Progressing   Problem: Health Behavior/Discharge Planning: Goal: Ability to manage health-related needs will improve Outcome: Not Progressing   Problem: Clinical Measurements: Goal: Ability to maintain clinical measurements within normal limits will improve Outcome: Not Progressing Goal: Will remain free from infection Outcome: Not Progressing Goal: Diagnostic test results will improve Outcome: Not Progressing Goal: Respiratory complications will improve Outcome: Not Progressing Goal: Cardiovascular complication will be avoided Outcome: Not Progressing   Problem: Activity: Goal: Risk for activity intolerance will decrease Outcome: Not Progressing   Problem: Nutrition: Goal: Adequate nutrition will be maintained Outcome: Not Progressing   Problem: Coping: Goal: Level of anxiety will decrease Outcome: Not Progressing   Problem: Elimination: Goal: Will not  experience complications related to bowel motility Outcome: Not Progressing Goal: Will not experience complications related to urinary retention Outcome: Not Progressing   Problem: Pain Managment: Goal: General experience of comfort will improve and/or be controlled Outcome: Not Progressing   Problem: Safety: Goal: Ability to remain free from injury will improve Outcome: Not Progressing   Problem: Skin Integrity: Goal: Risk for impaired skin integrity will decrease Outcome: Not Progressing

## 2024-02-09 NOTE — Code Documentation (Addendum)
 Stroke Response Nurse Documentation Code Documentation  [name] is a 83 y.o. male arriving to [facility]  via [facility] as Code Stroke with LKW last night 7/5 2200. He was found this AM on the bathroom floor by his wife. Patient takes Pradaxa  twice daily. Patient arrived in c-collar.  Stroke team met patient at bridge, airway cleared by EDP Pamella, labs drawn and patient taken to CT. NIH 25. CT/CTA completed. Hemorrhage seen on dry head. Delay in starting cleviprex  and starting CTA due to obtaining IV access. Praxbind  ordered--to be couriered from [employer]. Scanned DNR form in chart. Family called, arrived to bedside as patient taken to ED room- decision made to make patient comfort care. MD order to discontinue cleviprex , will not give Praxbind  that just arrived. ED RN Milo aware of care plan.   Tonna Lacks K  Rapid Response RN

## 2024-02-09 NOTE — Consult Note (Signed)
 NEUROLOGY CONSULT NOTE   Date of service: February 09, 2024 Patient Name: [name] MRN:  [id number] DOB:  [birth year] Chief Complaint: left sided weakness Requesting Provider: Pamella Ozell LABOR, DO  History of Present Illness  [name] is a 83 y.o. male with hx of atrial fibrillation on Pradaxa  who presents with left-sided weakness.  He was last seen well at 10 PM last night, and then this morning was found on the ground.  He was brought in as a code stroke due to his left-sided weakness and was taken emergently for CT of his head after I evaluated him at the bridge.  CT revealed very large IPH with significant midline shift.  Praxbind  was ordered, but was not immediately available and so was courier to from [employer].  CTA reveals active extravasation into the hematoma.  He does have a scanned DNR form, but initial attempts to contact family were not successful.  He was started on Cleviprex  for severe hypertensive emergency.  Once family did arrive, I had a conversation with both the patient's wife and daughter and explained the severe nature of his stroke, likely prolonged disability.  I asked in the setting, whether he would want artificial support, or to focus solely on comfort and not on any type of life-prolonging measures and the family agreed to pursue only comfort measures.  LKW: 10 PM Modified rankin score: 0-Completely asymptomatic and back to baseline post- stroke IV Thrombolysis: No, ICH  Past History   Past Medical History:  Diagnosis Date   Acute blood loss anemia 03/01/2018   Acute GI bleeding 02/28/2018   Basal cell carcinoma    Chronic atrial fibrillation (HCC)    Chronic dermatitis    CKD (chronic kidney disease), stage III (HCC)    a. Probable based on historical Cr. H/o ARF. Previously saw Dr. Perri.   Erectile dysfunction    GI bleed    Hypertension    Hypokalemia    Meningioma (HCC)    Probable 13 x 12 mm meningioma in right frontal region    Thrombocytopenia (HCC)    improved    Past Surgical History:  Procedure Laterality Date   ESOPHAGOGASTRODUODENOSCOPY (EGD) WITH PROPOFOL  N/A 03/01/2018   Procedure: ESOPHAGOGASTRODUODENOSCOPY (EGD) WITH PROPOFOL ;  Surgeon: Legrand Victory LITTIE DOUGLAS, MD;  Location: [facility] ENDOSCOPY;  Service: Gastroenterology;  Laterality: N/A;   EYE SURGERY     spot removed from cornea   SCHLEROTHERAPY  03/01/2018   Procedure: SCHLEROTHERAPY OF VARICES;  Surgeon: Legrand Victory LITTIE DOUGLAS, MD;  Location: [facility] ENDOSCOPY;  Service: Gastroenterology;;  epinephrine  1:10,000 injection / hemostasis clips   TEE WITH CARDIOVERSION  12/14/2004   Successful TEE guided electrical cardioversion. -- showed mild mitral insufficiency and trace pulmonic insufficiency    TOENAIL EXCISION     ingrown toenail removal   TONSILLECTOMY      Family History: Family History  Problem Relation Age of Onset   Heart disease Mother        with pacemaker   Diabetes Mother    Asthma Mother    Heart failure Mother    Bladder Cancer Mother    Hypertension Father    Pancreatic cancer Father    Diabetes Brother    Hypertension Sister    Diabetes Sister     Social History  reports that he quit smoking about 53 years ago. His smoking use included cigarettes. He started smoking about 63 years ago. He has a 10 pack-year smoking history. He has never  used smokeless tobacco. He reports that he does not drink alcohol  and does not use drugs.  Allergies  Allergen Reactions   Hctz [Hydrochlorothiazide] Photosensitivity   Cipro Iv [Ciprofloxacin] Hives and Rash    Caused red streaks up the arm    Medications   Current Facility-Administered Medications:    clevidipine  (CLEVIPREX ) infusion 0.5 mg/mL, 0-21 mg/hr, Intravenous, Continuous, Michaela Aisha SQUIBB, MD, Stopped at 02/09/24 9082   idaruCIZUMAB  (PRAXBIND ) injection 5 g, 5 g, Intravenous, STAT, Makaiyah Schweiger, Aisha SQUIBB, MD   sodium chloride  flush (NS) 0.9 % injection 3 mL, 3 mL, Intravenous, Once,  Pamella Ozell LABOR, DO  Current Outpatient Medications:    Acetaminophen  (TYLENOL  ARTHRITIS PAIN PO), Take 650 mg by mouth every 8 (eight) hours as needed., Disp: , Rfl:    benazepril  (LOTENSIN ) 20 MG tablet, TAKE 1 TABLET BY MOUTH TWICE  DAILY, Disp: 200 tablet, Rfl: 0   Cranberry 500 MG CAPS, Take 500 mg by mouth daily., Disp: , Rfl:    dabigatran  (PRADAXA ) 150 MG CAPS capsule, TAKE 1 CAPSULE BY MOUTH TWICE  DAILY, Disp: 60 capsule, Rfl: 11   diltiazem  (DILT-XR) 240 MG 24 hr capsule, TAKE 1 CAPSULE BY MOUTH ONCE  DAILY, Disp: 90 capsule, Rfl: 0   furosemide  (LASIX ) 20 MG tablet, Take 20 mg daily if needed for swelling, Disp: 90 tablet, Rfl: 3   Garlic 1000 MG CAPS, Take 1 capsule by mouth daily., Disp: , Rfl:    hydrALAZINE  (APRESOLINE ) 25 MG tablet, TAKE 1 TABLET BY MOUTH 3 TIMES  DAILY, Disp: 270 tablet, Rfl: 1   MAGNESIUM PO, Take 250 mg by mouth daily., Disp: , Rfl:    metoprolol  tartrate (LOPRESSOR ) 50 MG tablet, TAKE 1 TABLET BY MOUTH TWICE  DAILY, Disp: 180 tablet, Rfl: 0   Multiple Vitamin (MULTIVITAMIN) tablet, Take 1 tablet by mouth daily., Disp: , Rfl:    pantoprazole  (PROTONIX ) 20 MG tablet, TAKE 1 TABLET BY MOUTH DAILY, Disp: 100 tablet, Rfl: 2   pantoprazole  (PROTONIX ) 20 MG tablet, Take 1 tablet (20 mg total) by mouth daily., Disp: 100 tablet, Rfl: 2   potassium chloride  SA (KLOR-CON  M) 20 MEQ tablet, TAKE 1 TABLET BY MOUTH DAILY, Disp: 90 tablet, Rfl: 0   sildenafil (VIAGRA) 100 MG tablet, Take 100 mg by mouth daily as needed for erectile dysfunction., Disp: , Rfl:    tamsulosin (FLOMAX) 0.4 MG CAPS capsule, Take 0.4 mg by mouth daily after supper. , Disp: , Rfl: 0  Vitals   Vitals:   02/09/24 0906 02/09/24 0910 02/09/24 0915 02/09/24 0920  BP: 115/63 119/60 116/68 116/68  Pulse: 86 93 85 86  Resp: 16 (!) 21 14 12   Temp:      TempSrc:      SpO2: 97% 95% 94% 95%  Weight:      Height:        Body mass index is 25.19 kg/m.   Physical Exam   Constitutional: Appears  well-developed and well-nourished.   Neurologic Examination    Neuro: Mental Status: Patient is awake, but is speech is very garbled, he does tell me his name.  He is readily able to follow commands. Cranial Nerves: II: Left hemianopia. III,IV, VI: Unable to cross midline to the left V: VII: Left facial weakness  Motor: He has increased tone in the left arm and leg, is able to move his left arm minimally but I am able to raise it, his left leg he is able to raise it some.  Sensory: As above        Labs/Imaging/Neurodiagnostic studies   CBC:  Recent Labs  Lab 2024-02-26 0821 2024/02/26 0829  WBC 7.9  --   NEUTROABS 6.8  --   HGB 14.6 15.3  HCT 43.1 45.0  MCV 94.3  --   PLT 123*  --    Basic Metabolic Panel:  Lab Results  Component Value Date   NA 141 2024-02-26   K 3.5 2024-02-26   CO2 25 02/26/24   GLUCOSE 133 (H) 02/26/24   BUN 23 2024/02/26   CREATININE 1.20 02/26/24   CALCIUM 9.5 02-26-2024   GFRNONAA 58 (L) 2024/02/26   GFRAA 62 04/22/2020   Lipid Panel:  Lab Results  Component Value Date   LDLCALC 50 05/10/2023   HgbA1c: No results found for: HGBA1C Urine Drug Screen: No results found for: LABOPIA, COCAINSCRNUR, LABBENZ, AMPHETMU, THCU, LABBARB  Alcohol  Level     Component Value Date/Time   ETH <15 2024-02-26 0827   INR  Lab Results  Component Value Date   INR 1.4 (H) 02/26/2024   APTT  Lab Results  Component Value Date   APTT 54 (H) 02/26/24   AED levels: No results found for: PHENYTOIN, ZONISAMIDE, LAMOTRIGINE, LEVETIRACETA  CT Head without contrast(Personally reviewed): Large IPH with active extravasation on CTA   ASSESSMENT   [name] is a 83 y.o. male with likely hypertensive bleed in the setting of anticoagulation with Pradaxa .  Given the change in focus to comfort care, Praxbind  was not administered.  Even without active extravasation, I would expect him to have significant worsening over the  next 24 to 48 hours due to edema, but especially with the active extravasation, my suspicion is that he will progress significantly and fairly rapidly.  In any case, I think that comfort care is very reasonable at this time.  RECOMMENDATIONS  Comfort only care, neurology will be available as needed. ______________________________________________________________________   This patient is critically ill and at significant risk of neurological worsening, death and care requires constant monitoring of vital signs, hemodynamics,respiratory and cardiac monitoring, neurological assessment, discussion with family, other specialists and medical decision making of high complexity. I spent 65 minutes of neurocritical care time  in the care of  this patient. This was time spent independent of any time provided by nurse practitioner or PA.  Aisha Seals, MD [facility]   If 7pm- 7am, please page neurology on call as listed in AMION. 2024-02-26  9:30 AM

## 2024-02-09 NOTE — ED Notes (Signed)
 Per Pharmacy, Praxbind  will have to be delivered from [facility].

## 2024-02-09 NOTE — H&P (Signed)
 History and Physical    Patient: [name] FMW:[id number] DOB: 04/18/1941 DOA: 02/09/2024 DOS: the patient was seen and examined on 02/09/2024 PCP: Katheen Roselie Rockford, NP  Patient coming from: Home via EMS  Chief Complaint:  Chief Complaint  Patient presents with   Code Stroke   HPI: [name] is a 83 y.o. male with medical history significant of hypertension, chronic atrial fibrillation on Pradaxa , meningioma of the right frontal region, CKD, and history of GI bleed who presents after having a fall at home. He is accompanied by his wife who provides his history and a close family friend, Luke.  He was found on the floor in the bathroom this morning by his wife of over 60 years. The floor was wet, but it is unclear whether the wetness was due to urine or spilled water. He was stretched out on the floor when discovered and not responding.  Last reported well was sometime around 10 PM last night.  Patient was brought in as a code stroke.  In the ED patient was noted to be afebrile with mild tachypnea and blood pressures elevated up to 191/128.  CT scan of the head revealed a large acute right hemisphere hemorrhage with epicenter at the basal ganglia with moderate to large volume intraventricular hemorrhage with 13 mm leftward midline shift and early right uncal herniation.  Initially Praxbind  was ordered, but was not immediately available and was courier from [facility]. CT angiogram of the head was positive for active extract occasion into the large right basal ganglia hemorrhage.  DNR was present, but family was not initially able to be contacted for which she was started on a Cleviprex  drip for severe hypertensive emergency.  Once family arrived they were updated on his condition and ultimately decided to pursue only comfort measures.  Review of Systems: unable to review all systems due to the inability of the patient to answer questions. Past Medical History:  Diagnosis Date    Acute blood loss anemia 03/01/2018   Acute GI bleeding 02/28/2018   Basal cell carcinoma    Chronic atrial fibrillation (HCC)    Chronic dermatitis    CKD (chronic kidney disease), stage III (HCC)    a. Probable based on historical Cr. H/o ARF. Previously saw Dr. Perri.   Erectile dysfunction    GI bleed    Hypertension    Hypokalemia    Meningioma (HCC)    Probable 13 x 12 mm meningioma in right frontal region   Thrombocytopenia (HCC)    improved   Past Surgical History:  Procedure Laterality Date   ESOPHAGOGASTRODUODENOSCOPY (EGD) WITH PROPOFOL  N/A 03/01/2018   Procedure: ESOPHAGOGASTRODUODENOSCOPY (EGD) WITH PROPOFOL ;  Surgeon: Legrand Victory LITTIE DOUGLAS, MD;  Location: [facility] ENDOSCOPY;  Service: Gastroenterology;  Laterality: N/A;   EYE SURGERY     spot removed from cornea   SCHLEROTHERAPY  03/01/2018   Procedure: SCHLEROTHERAPY OF VARICES;  Surgeon: Legrand Victory LITTIE DOUGLAS, MD;  Location: [facility] ENDOSCOPY;  Service: Gastroenterology;;  epinephrine  1:10,000 injection / hemostasis clips   TEE WITH CARDIOVERSION  12/14/2004   Successful TEE guided electrical cardioversion. -- showed mild mitral insufficiency and trace pulmonic insufficiency    TOENAIL EXCISION     ingrown toenail removal   TONSILLECTOMY     Social History:  reports that he quit smoking about 53 years ago. His smoking use included cigarettes. He started smoking about 63 years ago. He has a 10 pack-year smoking history. He has never used smokeless  tobacco. He reports that he does not drink alcohol  and does not use drugs.  Allergies  Allergen Reactions   Hctz [Hydrochlorothiazide] Photosensitivity   Cipro Iv [Ciprofloxacin] Hives and Rash    Caused red streaks up the arm    Family History  Problem Relation Age of Onset   Heart disease Mother        with pacemaker   Diabetes Mother    Asthma Mother    Heart failure Mother    Bladder Cancer Mother    Hypertension Father    Pancreatic cancer Father    Diabetes Brother     Hypertension Sister    Diabetes Sister     Prior to Admission medications   Medication Sig Start Date End Date Taking? Authorizing Provider  Acetaminophen  (TYLENOL  ARTHRITIS PAIN PO) Take 650 mg by mouth every 8 (eight) hours as needed.    [provider]  benazepril  (LOTENSIN ) 20 MG tablet TAKE 1 TABLET BY MOUTH TWICE  DAILY 12/20/23   [place], Peter M, MD  Cranberry 500 MG CAPS Take 500 mg by mouth daily.    [provider]  dabigatran  (PRADAXA ) 150 MG CAPS capsule TAKE 1 CAPSULE BY MOUTH TWICE  DAILY 02/04/24   [place], Peter M, MD  diltiazem  (DILT-XR) 240 MG 24 hr capsule TAKE 1 CAPSULE BY MOUTH ONCE  DAILY 02/04/24   [place], Peter M, MD  furosemide  (LASIX ) 20 MG tablet Take 20 mg daily if needed for swelling 01/15/18   [place], Peter M, MD  Garlic 1000 MG CAPS Take 1 capsule by mouth daily.    [provider]  hydrALAZINE  (APRESOLINE ) 25 MG tablet TAKE 1 TABLET BY MOUTH 3 TIMES  DAILY 10/24/23   [place], Peter M, MD  MAGNESIUM PO Take 250 mg by mouth daily.    [provider]  metoprolol  tartrate (LOPRESSOR ) 50 MG tablet TAKE 1 TABLET BY MOUTH TWICE  DAILY 02/04/24   [place], Peter M, MD  Multiple Vitamin (MULTIVITAMIN) tablet Take 1 tablet by mouth daily.    [provider]  pantoprazole  (PROTONIX ) 20 MG tablet TAKE 1 TABLET BY MOUTH DAILY 11/26/23   Nche, Roselie Rockford, NP  pantoprazole  (PROTONIX ) 20 MG tablet Take 1 tablet (20 mg total) by mouth daily. 11/26/23   [place], Roselie Rockford, NP  potassium chloride  SA (KLOR-CON  M) 20 MEQ tablet TAKE 1 TABLET BY MOUTH DAILY 02/04/24   [place], Peter M, MD  sildenafil (VIAGRA) 100 MG tablet Take 100 mg by mouth daily as needed for erectile dysfunction.    [provider]  tamsulosin (FLOMAX) 0.4 MG CAPS capsule Take 0.4 mg by mouth daily after supper.  07/03/17   [provider]    Physical Exam: Vitals:   02/09/24 0910 02/09/24 0915 02/09/24 0920 02/09/24 0930  BP: 119/60 116/68 116/68 (!) 143/80   Pulse: 93 85 86 81  Resp: (!) 21 14 12 12   Temp:      TempSrc:      SpO2: 95% 94% 95% 94%  Weight:      Height:        Constitutional: Early male currently in no acute distress but not able to follow commands Eyes: Gaze preference. ENMT: Mucous membranes are moist.  Missing teeth present.  Dentures not currently in place Neck: normal, supple Respiratory: clear to auscultation bilaterally, no wheezing, no crackles. Normal respiratory effort. No accessory muscle use.  Cardiovascular: Irregular irregular. No extremity edema. 2+ pedal pulses.  Abdomen: no tenderness, no masses palpated. Bowel  sounds positive.  Musculoskeletal: no clubbing / cyanosis.  Skin: no rashes, lesions, ulcers.  Neurologic: Left hemiplegia and left-sided facial weakness.  Unable to move his right side.  Speech is garbled. Psychiatric: Lethargic, unable to assess orientation.  Data Reviewed:  Reviewed labs, imaging, and pertinent records as documented.  Assessment and Plan:  Intracranial hemorrhage Leftward midline shift of brain Uncal herniation Fall at home Chronic atrial fibrillation on chronic anticoagulation Hypertension Thrombocytopenia DNR Acute.  Patient presents after being found down on the floor by his wife this morning.  CT imaging revealed a large right intraventricular hemorrhage with left midline shift and signs of early right uncal herniation.  CTA noted active extravasation.  Initially started on Cleviprex  drip while attempting to reverse anticoagulation.  However, family confirmed DNR status and after talks with neurology request to pursue comfort measures only at this time. - Admit to a palliative bed - Palliative care order set initiated - Discontinue cardiac monitoring - Routine vital sign checks - Discontinued home medications - N.p.o. - Okay for RN to pronounce death - Aspiration precautions - Maintain IV access - Nasal cannula oxygen as needed for comfort - Morphine  IV pain -  Zofran  IV prn nausea/vomiting - Ativan  IV prn anxiety - Haloperidol  IV prn agitation or delirium - Glycopyrrolate  prn excessive secretions - Albuterol  prn wheezing    DVT prophylaxis: None Advance Care Planning:   Code Status: Do not attempt resuscitation (DNR) - Comfort care   Consults: Neurology  Family Communication: Family updated at bedside  Severity of Illness: The appropriate patient status for this patient is INPATIENT. Inpatient status is judged to be reasonable and necessary in order to provide the required intensity of service to ensure the patient's safety. The patient's presenting symptoms, physical exam findings, and initial radiographic and laboratory data in the context of their chronic comorbidities is felt to place them at high risk for further clinical deterioration. Furthermore, it is not anticipated that the patient will be medically stable for discharge from the hospital within 2 midnights of admission.   * I certify that at the point of admission it is my clinical judgment that the patient will require inpatient hospital care spanning beyond 2 midnights from the point of admission due to high intensity of service, high risk for further deterioration and high frequency of surveillance required.*  Author: Maximino DELENA Sharps, MD 02/09/2024 9:46 AM  For on call review [email or web address].

## 2024-02-09 NOTE — ED Triage Notes (Signed)
 Pt arrives via EMS from home as Code Stroke. EMS reports pts wife found pt down this morning in bathroom with left side weakness, neglect, right sided gaze. Pt on pradaxa . Ccollar in place.  EMS reports CBG 135, BP 171/103

## 2024-02-09 NOTE — ED Provider Notes (Signed)
 [facility] EMERGENCY DEPARTMENT AT [facility] Provider Note   CSN: [id number] Arrival date & time: 02/09/24  9180  An emergency department physician performed an initial assessment on this suspected stroke patient at [phone].  Patient presents with: Code Stroke   ALPHA MYSLIWIEC is a 83 y.o. male.  With a history of of atrial fibrillation on Pradaxa , CKD and hypertension who presents to the ED given concern for stroke.  Patient was found on the floor of the bathroom at home by his wife earlier this morning.  She was unable to help him up and patient was unable to get up under his own power so EMS was called.  Last known well time of 2200 last night.  EMS voices concern for stroke given left-sided weakness, left-sided neglect with rightward gaze.  Patient unable to provide additional history secondary to clinical condition   HPI     Prior to Admission medications   Medication Sig Start Date End Date Taking? Authorizing Provider  Acetaminophen  (TYLENOL  ARTHRITIS PAIN PO) Take 650 mg by mouth every 8 (eight) hours as needed.    [provider]  benazepril  (LOTENSIN ) 20 MG tablet TAKE 1 TABLET BY MOUTH TWICE  DAILY 12/20/23   [place], Peter M, MD  Cranberry 500 MG CAPS Take 500 mg by mouth daily.    [provider]  dabigatran  (PRADAXA ) 150 MG CAPS capsule TAKE 1 CAPSULE BY MOUTH TWICE  DAILY 02/04/24   [place], Peter M, MD  diltiazem  (DILT-XR) 240 MG 24 hr capsule TAKE 1 CAPSULE BY MOUTH ONCE  DAILY 02/04/24   [place], Peter M, MD  furosemide  (LASIX ) 20 MG tablet Take 20 mg daily if needed for swelling 01/15/18   [place], Peter M, MD  Garlic 1000 MG CAPS Take 1 capsule by mouth daily.    [provider]  hydrALAZINE  (APRESOLINE ) 25 MG tablet TAKE 1 TABLET BY MOUTH 3 TIMES  DAILY 10/24/23   [place], Peter M, MD  MAGNESIUM PO Take 250 mg by mouth daily.    [provider]  metoprolol  tartrate (LOPRESSOR ) 50 MG tablet TAKE 1 TABLET BY MOUTH TWICE  DAILY 02/04/24    [place], Peter M, MD  Multiple Vitamin (MULTIVITAMIN) tablet Take 1 tablet by mouth daily.    [provider]  pantoprazole  (PROTONIX ) 20 MG tablet TAKE 1 TABLET BY MOUTH DAILY 11/26/23   Nche, Roselie Rockford, NP  pantoprazole  (PROTONIX ) 20 MG tablet Take 1 tablet (20 mg total) by mouth daily. 11/26/23   Nche, Roselie Rockford, NP  potassium chloride  SA (KLOR-CON  M) 20 MEQ tablet TAKE 1 TABLET BY MOUTH DAILY 02/04/24   [place], Peter M, MD  sildenafil (VIAGRA) 100 MG tablet Take 100 mg by mouth daily as needed for erectile dysfunction.    [provider]  tamsulosin (FLOMAX) 0.4 MG CAPS capsule Take 0.4 mg by mouth daily after supper.  07/03/17   [provider]    Allergies: Hctz [hydrochlorothiazide] and Cipro iv [ciprofloxacin]    Review of Systems  Updated Vital Signs BP (!) 143/80   Pulse 81   Temp 99.3 F (37.4 C) (Axillary)   Resp 12   Ht 6' 2 (1.88 m)   Wt 89 kg   SpO2 94%   BMI 25.19 kg/m   Physical Exam Vitals and nursing note reviewed.  HENT:     Head: Normocephalic and atraumatic.  Eyes:     Pupils: Pupils are equal, round, and reactive to light.  Cardiovascular:  Rate and Rhythm: Normal rate and regular rhythm.  Pulmonary:     Effort: Pulmonary effort is normal.     Breath sounds: Normal breath sounds.  Abdominal:     Palpations: Abdomen is soft.     Tenderness: There is no abdominal tenderness.  Skin:    General: Skin is warm and dry.  Neurological:     Mental Status: He is alert.     Comments: Patient awake alert able to state yes no, garbled speech Left-sided neglect Left-sided weakness upper and lower extremity Rightward gaze preference  Psychiatric:        Mood and Affect: Mood normal.     (all labs ordered are listed, but only abnormal results are displayed) Labs Reviewed  PROTIME-INR - Abnormal; Notable for the following components:      Result Value   Prothrombin Time 17.8 (*)    INR 1.4 (*)    All other components  within normal limits  APTT - Abnormal; Notable for the following components:   aPTT 54 (*)    All other components within normal limits  CBC - Abnormal; Notable for the following components:   Platelets 123 (*)    All other components within normal limits  COMPREHENSIVE METABOLIC PANEL WITH GFR - Abnormal; Notable for the following components:   Glucose, Bld 135 (*)    Total Bilirubin 1.5 (*)    GFR, Estimated 58 (*)    All other components within normal limits  I-STAT CHEM 8, ED - Abnormal; Notable for the following components:   Glucose, Bld 133 (*)    Calcium, Ion 1.13 (*)    All other components within normal limits  CBG MONITORING, ED - Abnormal; Notable for the following components:   Glucose-Capillary 135 (*)    All other components within normal limits  DIFFERENTIAL  ETHANOL  TYPE AND SCREEN    EKG: None  Radiology: CT ANGIO HEAD NECK W WO CM (CODE STROKE) Result Date: 02/09/2024 CLINICAL DATA:  83 year old male code stroke presentation with large volume acute intracranial hemorrhage. EXAM: CT ANGIOGRAPHY HEAD AND NECK TECHNIQUE: Multidetector CT imaging of the head and neck was performed using the standard protocol during bolus administration of intravenous contrast. Multiplanar CT image reconstructions and MIPs were obtained to evaluate the vascular anatomy. Carotid stenosis measurements (when applicable) are obtained utilizing NASCET criteria, using the distal internal carotid diameter as the denominator. RADIATION DOSE REDUCTION: This exam was performed according to the departmental dose-optimization program which includes automated exposure control, adjustment of the mA and/or kV according to patient size and/or use of iterative reconstruction technique. CONTRAST:  75mL OMNIPAQUE  IOHEXOL  350 MG/ML SOLN COMPARISON:  Plain head CT 0832 hours today. FINDINGS: CTA NECK Skeleton: Cervical spine Diffuse idiopathic skeletal hyperostosis (DISH). No acute osseous abnormality  identified. Upper chest: Right upper lobe azygous vein and fissure, normal variant. Other neck: Nonvascular neck soft tissue spaces appear negative. Aortic arch: Aortic arch not completely included. Probable 3 vessel arch. Right carotid system: Mild for age atherosclerosis.  No stenosis. Left carotid system: Mild for age atherosclerosis, mostly affecting the ECA. No CCA or ICA stenosis. Vertebral arteries: Tortuous proximal right subclavian artery with normal right vertebral artery origin. Patent right vertebral to the skull base with no plaque or stenosis. Patent proximal left subclavian artery. Calcified plaque at the left vertebral artery origin resulting in only mild stenosis on series 11, image 114. Codominant left vertebral artery than patent and normal to the skull base. CTA HEAD Posterior circulation:  Mildly dominant left vertebral V4 segment. Mild V4 atherosclerosis bilaterally with no significant stenosis. Normal left PICA origin. Patent vertebrobasilar junction. Dominant right AICA appears patent. Patent basilar artery without stenosis. Patent SCA and PCA origins. Posterior communicating arteries are diminutive or absent. Bilateral PCA branches are within normal limits. Anterior circulation: Both ICA siphons are patent. Left siphon mild to moderate calcified plaque. Mild left siphon stenosis. Right siphon similar mild to moderate calcified plaque. Right siphon anterior genu stenosis is mild. Separate mild supraclinoid stenosis. Patent carotid termini. Patent MCA and ACA origins. Anterior communicating artery and bilateral ACA branches are patent. Mass effect on the ACAs, otherwise negative. Left MCA M1 segment and bifurcation are patent without stenosis. Left MCA branches are within normal limits. Mild mass effect on the right ICA terminus, right MCA origin which remain patent. Right MCA bifurcates early without stenosis. Mass effect on the right MCA branches which otherwise appear patent and without  stenosis. However, large CTA spot sign, active contrast extravasation within the acute basal ganglia hemorrhage best seen on series 7, image 10. This appears related to right lenticulostriate artery region, with two separate jets of contrast into the hematoma. And along the posterior margin of the hematoma a separate area of contrast extravasation is noted on series 9, image 98, more globular and located near posterior right MCA branches. Venous sinuses: No abnormal draining veins or evidence of a vascular malformation. Superior sagittal sinus appears to remain patent despite chronic meningioma there. Anatomic variants: None significant. Review of the MIP images confirms the above findings IMPRESSION: 1. Positive for multifocal Active Contrast Extravasation into the large right basal ganglia Hemorrhage, including from the right lentiform striate artery region. High risk of additional hemorrhage expansion. 2. Negative for large vessel occlusion, intracranial aneurysm, or discrete AVM. 3. Mild for age atherosclerosis in the head and neck, no significant arterial stenosis identified. #1 communicated to Dr. Michaela at 9:08 am on 02/09/2024 by text page via the [facility] messaging system. Electronically Signed   By: VEAR Hurst M.D.   On: 02/09/2024 09:10   CT HEAD CODE STROKE WO CONTRAST Result Date: 02/09/2024 CLINICAL DATA:  Code stroke. 83 year old male with neurologic deficit. EXAM: CT HEAD WITHOUT CONTRAST TECHNIQUE: Contiguous axial images were obtained from the base of the skull through the vertex without intravenous contrast. RADIATION DOSE REDUCTION: This exam was performed according to the departmental dose-optimization program which includes automated exposure control, adjustment of the mA and/or kV according to patient size and/or use of iterative reconstruction technique. COMPARISON:  Head CT 05/19/2006. FINDINGS: Brain: Chronic right superior parasagittal calcified meningioma was present in 2007, proximally  19 mm diameter now (15 mm previously). This appears incidental. Superimposed large acute intracranial hemorrhage. Hyperdense hemorrhage epicenter appears to be the right basal ganglia. Lobulated intra-axial hematoma encompasses 80 by 57 x 45 mm (AP by transverse by CC) for an estimated volume of 103 mL. Superimposed intraventricular hemorrhage, moderate volume. Intracranial mass effect including early right uncal herniation. Leftward midline shift is up to 13 mm. Temporal horns are relatively symmetric at this time. Blood also in the 3rd and 4th ventricles which are nondilated. Trace superimposed right hemisphere subarachnoid hemorrhage in the posterior sylvian fissure. Partially effaced suprasellar cistern. Other basilar cisterns are patent. No tonsillar herniation. Confluent right frontal lobe hypodense edema and additional bilateral confluent periventricular white matter hypodensity. No superimposed cortically based infarct is evident. Vascular: Calcified atherosclerosis at the skull base. No suspicious intracranial vascular hyperdensity. Skull: Stable and intact. Sinuses/Orbits: Visualized  paranasal sinuses and mastoids are stable and well aerated. Other: No acute orbit or scalp soft tissue finding. ASPECTS [facility] Stroke Program Early CT Score) Total score (0-10 with 10 being normal): Not applicable, acute hemorrhage. IMPRESSION: 1. Large acute right hemisphere hemorrhage with epicenter at the basal ganglia, estimated 103 mL intra-axial volume. Moderate to large volume IVH. Trace SAH. Intracranial mass effect with 13 mm leftward midline shift, early right uncal herniation. No skull fracture identified. 2. Underlying chronic meningioma and white matter disease. These results were communicated to Dr. Michaela at 8:37 am on 02/09/2024 by text page via the [facility] messaging system. And briefly discussed by telephone with Dr. Michaela at 08:40 . Electronically Signed   By: VEAR Hurst M.D.   On: 02/09/2024 08:40      .Ultrasound ED Peripheral IV (Provider)  Date/Time: 02/09/2024 9:44 AM  Performed by: Pamella Ozell LABOR, DO Authorized by: Pamella Ozell LABOR, DO   Procedure details:    Indications: poor IV access     Skin Prep: chlorhexidine gluconate     Location:  Left AC   Angiocath:  20 G   Bedside Ultrasound Guided: Yes     Images: not archived     Patient tolerated procedure without complications: Yes     Dressing applied: Yes   .Critical Care  Performed by: Pamella Ozell LABOR, DO Authorized by: Pamella Ozell LABOR, DO   Critical care provider statement:    Critical care time (minutes):  30   Critical care was necessary to treat or prevent imminent or life-threatening deterioration of the following conditions:  CNS failure or compromise   Critical care was time spent personally by me on the following activities:  Development of treatment plan with patient or surrogate, discussions with consultants, evaluation of patient's response to treatment, examination of patient, ordering and review of laboratory studies, ordering and review of radiographic studies, ordering and performing treatments and interventions, pulse oximetry, re-evaluation of patient's condition and review of old charts   I assumed direction of critical care for this patient from another provider in my specialty: no     Care discussed with: admitting provider   Comments:     Discussed with patient's family, neurology and hospitalist    Medications Ordered in the ED  sodium chloride  flush (NS) 0.9 % injection 3 mL (3 mLs Intravenous Not Given 02/09/24 0907)  idaruCIZUMAB  (PRAXBIND ) injection 5 g (5 g Intravenous Not Given 02/09/24 0920)  clevidipine  (CLEVIPREX ) infusion 0.5 mg/mL (0 mg/hr Intravenous Stopped 02/09/24 0917)  iohexol  (OMNIPAQUE ) 350 MG/ML injection 75 mL (75 mLs Intravenous Contrast Given 02/09/24 0857)                                    Medical Decision Making 83 year old male with history as above presented to ED  given concern for stroke.  Activated as code stroke.  Last known well time of 2200 last night.  Found down in the bathroom by his wife.  Left-sided weakness with rightward gaze preference.  Examined with neurology team at the door.  Severe right sided bleed with likely ongoing bleeding.  Dr. Michaela (neurology) has discussed with family.  Will not reverse Pradaxa .  Patient currently DNR/DNI.  Family will transition to comfort measures only given significant deficits with very poor prognosis.  Patient will be admitted to hospitalist service comfort measures only.  Amount and/or Complexity of Data Reviewed Labs: ordered. Radiology: ordered.  Risk Prescription drug management. Decision regarding hospitalization.        Final diagnoses:  Hemorrhagic stroke [facility])    ED Discharge Orders     None          Pamella Ozell LABOR, DO 02/09/24 [phone]

## 2024-02-09 NOTE — ED Notes (Signed)
 Cleviprex  stopped per MD.

## 2024-02-10 ENCOUNTER — Telehealth: Payer: Self-pay

## 2024-02-10 ENCOUNTER — Other Ambulatory Visit (HOSPITAL_COMMUNITY): Payer: Self-pay

## 2024-02-10 DIAGNOSIS — Z7189 Other specified counseling: Secondary | ICD-10-CM

## 2024-02-10 DIAGNOSIS — I629 Nontraumatic intracranial hemorrhage, unspecified: Secondary | ICD-10-CM | POA: Diagnosis not present

## 2024-02-11 NOTE — Telephone Encounter (Addendum)
 Closing encounter. Request to be disregarded.

## 2024-02-14 ENCOUNTER — Ambulatory Visit: Admitting: Cardiology

## 2024-02-17 ENCOUNTER — Telehealth: Payer: Self-pay | Admitting: Nurse Practitioner

## 2024-02-17 NOTE — Telephone Encounter (Signed)
 Pt daughter came in office today to advise her father passed away Mar 02, 2024. He had a fall that led to a brain bleed & stroke. He passed away at [facility].   Reported by Sharlet Ely, daughter

## 2024-03-06 NOTE — Hospital Course (Signed)
 [name] is a 83 y.o. male with medical history significant of hypertension, chronic atrial fibrillation on Pradaxa , meningioma of the right frontal region, CKD, and history of GI bleed who presented after having a fall at home.    He was found on the floor in the bathroom in the morning by his wife.  Last seen normal going to bed together the night prior. On workup in the ER, CT head showed large acute right hemisphere hemorrhage with epicenter involving basal ganglia and moderate to large volume IVH. CT angio head/neck also was notable for multifocal active contrast extravasation into the right basal ganglia.  Prognosis was considered poor and after discussion with neurology and family regarding patient advanced directives, decision was made for pursuit of comfort care measures. He was started on a morphine  drip and admitted for continued comfort. He had expected progressive decline while comfort was achieved and maintained.  He passed naturally on 02/11/2024 at 1738.

## 2024-03-06 NOTE — Assessment & Plan Note (Signed)
-   See intracranial hemorrhage - Continue comfort

## 2024-03-06 NOTE — Telephone Encounter (Signed)
 Will send this message to Dr. [place] and his nurse for further review and advisement on RX request per PA team for eliquis or xarelto  to be sent in, being Pradaxa  is not preferred by pts carrier at this time.

## 2024-03-06 NOTE — Discharge Summary (Signed)
 Death Summary  [name] FMW:[id number] DOB: Dec 01, 1940 DOA: 11-Feb-2024  PCP: Katheen Roselie Rockford, NP  Admit date: 02-11-24 Date of Death: 02-13-2024 Time of Death: 02-19-1737 Notification: Family notified of death   History of present illness:  [name] is a 83 y.o. male with medical history significant of hypertension, chronic atrial fibrillation on Pradaxa , meningioma of the right frontal region, CKD, and history of GI bleed who presented after having a fall at home.    He was found on the floor in the bathroom in the morning by his wife.  Last seen normal going to bed together the night prior. On workup in the ER, CT head showed large acute right hemisphere hemorrhage with epicenter involving basal ganglia and moderate to large volume IVH. CT angio head/neck also was notable for multifocal active contrast extravasation into the right basal ganglia.  Prognosis was considered poor and after discussion with neurology and family regarding patient advanced directives, decision was made for pursuit of comfort care measures. He was started on a morphine  drip and admitted for continued comfort. He had expected progressive decline while comfort was achieved and maintained.  He passed naturally on 13-Feb-2024 at 1738.  Final Diagnoses:  Intracranial hemorrhage  Active Hospital Problems   Diagnosis Date Noted   Intracranial hemorrhage (HCC) 02/11/2024    Priority: 1.   Fall at home, initial encounter February 11, 2024    Priority: 1.   Uncal herniation (HCC) 2024/02/11    Priority: 1.   Midline shift of brain 02/11/2024    Priority: 1.   Goals of care, counseling/discussion Feb 12, 2024    Priority: 2.   Chronic anticoagulation 01/18/2014    Priority: 2.   Chronic atrial fibrillation (HCC) 06/25/2008    Priority: 2.   Essential hypertension 06/25/2008    Priority: 3.   Thrombocytopenia (HCC) 02/28/2018    Priority: 4.   DNR (do not resuscitate) discussion 04/24/2021    Priority: 5.     Resolved Hospital Problems  No resolved problems to display.    The results of significant diagnostics from this hospitalization (including imaging, microbiology, ancillary and laboratory) are listed below for reference.    Significant Diagnostic Studies: CT ANGIO HEAD NECK W WO CM (CODE STROKE) Result Date: Feb 11, 2024 CLINICAL DATA:  83 year old male code stroke presentation with large volume acute intracranial hemorrhage. EXAM: CT ANGIOGRAPHY HEAD AND NECK TECHNIQUE: Multidetector CT imaging of the head and neck was performed using the standard protocol during bolus administration of intravenous contrast. Multiplanar CT image reconstructions and MIPs were obtained to evaluate the vascular anatomy. Carotid stenosis measurements (when applicable) are obtained utilizing NASCET criteria, using the distal internal carotid diameter as the denominator. RADIATION DOSE REDUCTION: This exam was performed according to the departmental dose-optimization program which includes automated exposure control, adjustment of the mA and/or kV according to patient size and/or use of iterative reconstruction technique. CONTRAST:  75mL OMNIPAQUE  IOHEXOL  350 MG/ML SOLN COMPARISON:  Plain head CT 0832 hours today. FINDINGS: CTA NECK Skeleton: Cervical spine Diffuse idiopathic skeletal hyperostosis (DISH). No acute osseous abnormality identified. Upper chest: Right upper lobe azygous vein and fissure, normal variant. Other neck: Nonvascular neck soft tissue spaces appear negative. Aortic arch: Aortic arch not completely included. Probable 3 vessel arch. Right carotid system: Mild for age atherosclerosis.  No stenosis. Left carotid system: Mild for age atherosclerosis, mostly affecting the ECA. No CCA or ICA stenosis. Vertebral arteries: Tortuous proximal right subclavian artery with normal right vertebral artery origin. Patent right vertebral to  the skull base with no plaque or stenosis. Patent proximal left subclavian artery.  Calcified plaque at the left vertebral artery origin resulting in only mild stenosis on series 11, image 114. Codominant left vertebral artery than patent and normal to the skull base. CTA HEAD Posterior circulation: Mildly dominant left vertebral V4 segment. Mild V4 atherosclerosis bilaterally with no significant stenosis. Normal left PICA origin. Patent vertebrobasilar junction. Dominant right AICA appears patent. Patent basilar artery without stenosis. Patent SCA and PCA origins. Posterior communicating arteries are diminutive or absent. Bilateral PCA branches are within normal limits. Anterior circulation: Both ICA siphons are patent. Left siphon mild to moderate calcified plaque. Mild left siphon stenosis. Right siphon similar mild to moderate calcified plaque. Right siphon anterior genu stenosis is mild. Separate mild supraclinoid stenosis. Patent carotid termini. Patent MCA and ACA origins. Anterior communicating artery and bilateral ACA branches are patent. Mass effect on the ACAs, otherwise negative. Left MCA M1 segment and bifurcation are patent without stenosis. Left MCA branches are within normal limits. Mild mass effect on the right ICA terminus, right MCA origin which remain patent. Right MCA bifurcates early without stenosis. Mass effect on the right MCA branches which otherwise appear patent and without stenosis. However, large CTA spot sign, active contrast extravasation within the acute basal ganglia hemorrhage best seen on series 7, image 10. This appears related to right lenticulostriate artery region, with two separate jets of contrast into the hematoma. And along the posterior margin of the hematoma a separate area of contrast extravasation is noted on series 9, image 98, more globular and located near posterior right MCA branches. Venous sinuses: No abnormal draining veins or evidence of a vascular malformation. Superior sagittal sinus appears to remain patent despite chronic meningioma  there. Anatomic variants: None significant. Review of the MIP images confirms the above findings IMPRESSION: 1. Positive for multifocal Active Contrast Extravasation into the large right basal ganglia Hemorrhage, including from the right lentiform striate artery region. High risk of additional hemorrhage expansion. 2. Negative for large vessel occlusion, intracranial aneurysm, or discrete AVM. 3. Mild for age atherosclerosis in the head and neck, no significant arterial stenosis identified. #1 communicated to Dr. Michaela at 9:08 am on 02/09/2024 by text page via the [facility] messaging system. Electronically Signed   By: VEAR Hurst M.D.   On: 02/09/2024 09:10   CT HEAD CODE STROKE WO CONTRAST Result Date: 02/09/2024 CLINICAL DATA:  Code stroke. 83 year old male with neurologic deficit. EXAM: CT HEAD WITHOUT CONTRAST TECHNIQUE: Contiguous axial images were obtained from the base of the skull through the vertex without intravenous contrast. RADIATION DOSE REDUCTION: This exam was performed according to the departmental dose-optimization program which includes automated exposure control, adjustment of the mA and/or kV according to patient size and/or use of iterative reconstruction technique. COMPARISON:  Head CT 05/19/2006. FINDINGS: Brain: Chronic right superior parasagittal calcified meningioma was present in 2007, proximally 19 mm diameter now (15 mm previously). This appears incidental. Superimposed large acute intracranial hemorrhage. Hyperdense hemorrhage epicenter appears to be the right basal ganglia. Lobulated intra-axial hematoma encompasses 80 by 57 x 45 mm (AP by transverse by CC) for an estimated volume of 103 mL. Superimposed intraventricular hemorrhage, moderate volume. Intracranial mass effect including early right uncal herniation. Leftward midline shift is up to 13 mm. Temporal horns are relatively symmetric at this time. Blood also in the 3rd and 4th ventricles which are nondilated. Trace superimposed  right hemisphere subarachnoid hemorrhage in the posterior sylvian fissure. Partially effaced suprasellar  cistern. Other basilar cisterns are patent. No tonsillar herniation. Confluent right frontal lobe hypodense edema and additional bilateral confluent periventricular white matter hypodensity. No superimposed cortically based infarct is evident. Vascular: Calcified atherosclerosis at the skull base. No suspicious intracranial vascular hyperdensity. Skull: Stable and intact. Sinuses/Orbits: Visualized paranasal sinuses and mastoids are stable and well aerated. Other: No acute orbit or scalp soft tissue finding. ASPECTS [facility] Stroke Program Early CT Score) Total score (0-10 with 10 being normal): Not applicable, acute hemorrhage. IMPRESSION: 1. Large acute right hemisphere hemorrhage with epicenter at the basal ganglia, estimated 103 mL intra-axial volume. Moderate to large volume IVH. Trace SAH. Intracranial mass effect with 13 mm leftward midline shift, early right uncal herniation. No skull fracture identified. 2. Underlying chronic meningioma and white matter disease. These results were communicated to Dr. Michaela at 8:37 am on 02/09/2024 by text page via the [facility] messaging system. And briefly discussed by telephone with Dr. Michaela at 08:40 . Electronically Signed   By: VEAR Hurst M.D.   On: 02/09/2024 08:40    Microbiology: No results found for this or any previous visit (from the past 240 hours).   Labs: Basic Metabolic Panel: Recent Labs  Lab 02/09/24 0821 02/09/24 0829  NA 140 141  K 3.5 3.5  CL 103 106  CO2 25  --   GLUCOSE 135* 133*  BUN 22 23  CREATININE 1.23 1.20  CALCIUM 9.5  --    Liver Function Tests: Recent Labs  Lab 02/09/24 0821  AST 25  ALT 16  ALKPHOS 77  BILITOT 1.5*  PROT 7.6  ALBUMIN 4.4   No results for input(s): LIPASE, AMYLASE in the last 168 hours. No results for input(s): AMMONIA in the last 168 hours. CBC: Recent Labs  Lab 02/09/24 0821  02/09/24 0829  WBC 7.9  --   NEUTROABS 6.8  --   HGB 14.6 15.3  HCT 43.1 45.0  MCV 94.3  --   PLT 123*  --    Cardiac Enzymes: No results for input(s): CKTOTAL, CKMB, CKMBINDEX, TROPONINI in the last 168 hours. D-Dimer No results for input(s): DDIMER in the last 72 hours. BNP: Invalid input(s): POCBNP CBG: Recent Labs  Lab 02/09/24 0824  GLUCAP 135*   Anemia work up No results for input(s): VITAMINB12, FOLATE, FERRITIN, TIBC, IRON, RETICCTPCT in the last 72 hours. Urinalysis    Component Value Date/Time   COLORURINE STRAW (A) 02/28/2018 1342   APPEARANCEUR CLEAR 02/28/2018 1342   LABSPEC 1.013 02/28/2018 1342   PHURINE 5.0 02/28/2018 1342   GLUCOSEU NEGATIVE 02/28/2018 1342   HGBUR NEGATIVE 02/28/2018 1342   BILIRUBINUR NEGATIVE 02/28/2018 1342   BILIRUBINUR neg 10/23/2017 0955   KETONESUR 5 (A) 02/28/2018 1342   PROTEINUR NEGATIVE 02/28/2018 1342   UROBILINOGEN 1.0 10/23/2017 0955   UROBILINOGEN 0.2 08/27/2014 1345   NITRITE NEGATIVE 02/28/2018 1342   LEUKOCYTESUR NEGATIVE 02/28/2018 1342   Sepsis Labs Recent Labs  Lab 02/09/24 0821  WBC 7.9       SIGNED:  Alm Apo, MD  [facility] 02/11/2024, 2:14 PM

## 2024-03-06 NOTE — Progress Notes (Signed)
 Progress Note    [name]   FMW:[id number]  DOB: Aug 10, 1940  DOA: 02/09/2024     1 PCP: Katheen Roselie Rockford, NP  Initial CC: Fall at home  [place] [facility] Course: YOUSAF SAINATO is a 83 y.o. male with medical history significant of hypertension, chronic atrial fibrillation on Pradaxa , meningioma of the right frontal region, CKD, and history of GI bleed who presented after having a fall at home.    He was found on the floor in the bathroom in the morning by his wife.  Last seen normal going to bed together the night prior. On workup in the ER, CT head showed large acute right hemisphere hemorrhage with epicenter involving basal ganglia and moderate to large volume IVH. CT angio head/neck also was notable for multifocal active contrast extravasation into the right basal ganglia.  Prognosis was considered poor and after discussion with neurology and family regarding patient advanced directives, decision was made for pursuit of comfort care measures. He was started on a morphine  drip and admitted for continued comfort.  Interval History:  Seems to be comfortable.  Does not interact and his pupils are fixed and dilated.  No withdrawal to painful stimuli.  Family present bedside.  Assessment and Plan: * Intracranial hemorrhage (HCC) - s/p u/k etiology of fall at home in middle of night; in setting of anticoagulation and traumatic fall, patient has severe intracranial hemorrhage; prognosis poor -Agree with pursuing comfort care measures - Family has spoken with neurology as well on admission - Continue morphine  drip and titrate for comfort  Goals of care, counseling/discussion - See intracranial hemorrhage - Continue comfort   Old records reviewed in assessment of this patient  Antimicrobials:   DVT prophylaxis:     Code Status:   Code Status: Do not attempt resuscitation (DNR) - Comfort care  Mobility Assessment (Last 72 Hours)     Mobility Assessment     Row Name 03/01/2024  0915 02/09/24 2021 02/09/24 1852 02/09/24 1851     Does patient have an order for bedrest or is patient medically unstable Yes- Bedfast (Level 1) - Complete Yes- Bedfast (Level 1) - Complete Yes- Bedfast (Level 1) - Complete Yes- Bedfast (Level 1) - Complete       Barriers to discharge: None Disposition Plan: Inpatient death expected Status is: Inpatient  Objective: Blood pressure (!) 90/59, pulse (!) 142, temperature 97.7 F (36.5 C), temperature source Axillary, resp. rate 13, height 6' 2 (1.88 m), weight 89 kg, SpO2 (!) 89%.  Examination:  Physical Exam Constitutional:      Comments: None interactive laying in bed appearing comfortable with even unlabored respirations  HENT:     Head: Normocephalic and atraumatic.     Mouth/Throat:     Mouth: Mucous membranes are moist.  Eyes:     Comments: Fixed and dilated pupils  Cardiovascular:     Rate and Rhythm: Normal rate and regular rhythm.  Pulmonary:     Effort: Pulmonary effort is normal. No respiratory distress.     Breath sounds: Normal breath sounds. No wheezing.  Abdominal:     General: Bowel sounds are normal. There is no distension.     Palpations: Abdomen is soft.     Tenderness: There is no abdominal tenderness.  Skin:    General: Skin is warm and dry.  Neurological:     Comments: Pupils fixed and dilated.  Does not withdrawal to painful stimuli      Consultants:  Neurology  Procedures:  Data Reviewed: No results found for this or any previous visit (from the past 24 hours).  I have reviewed pertinent nursing notes, vitals, labs, and images as necessary. I have ordered labwork to follow up on as indicated.  I have reviewed the last notes from staff over past 24 hours. I have discussed patient's care plan and test results with nursing staff, CM/SW, and other staff as appropriate.  Time spent: Greater than 50% of the 55 minute visit was spent in counseling/coordination of care for the patient as laid out in  the A&P.   LOS: 1 day   Alm Apo, MD [facility] March 05, 2024, 12:23 PM

## 2024-03-06 NOTE — Plan of Care (Signed)

## 2024-03-06 NOTE — Telephone Encounter (Signed)
 Pharmacy Patient Advocate Encounter   Received notification from Patient Pharmacy that prior authorization for PRADAXA  is required/requested.   Insurance verification completed.   The patient is insured through [employer] .   Per test claim:  ELIQUIS OR XARELTO  is preferred by the insurance.  If suggested medication is appropriate, Please send in a new RX and discontinue this one. If not, please advise as to why it's not appropriate so that we may request a Prior Authorization. Please note, some preferred medications may still require a PA.  If the suggested medications have not been trialed and there are no contraindications to their use, the PA will not be submitted, as it will not be approved.

## 2024-03-06 NOTE — Progress Notes (Signed)
 This RN wasted 47 mL of morphine  with Apolinar, LPN in pyxis.

## 2024-03-06 NOTE — Assessment & Plan Note (Signed)
-   s/p u/k etiology of fall at home in middle of night; in setting of anticoagulation and traumatic fall, patient has severe intracranial hemorrhage; prognosis poor -Agree with pursuing comfort care measures - Family has spoken with neurology as well on admission - Continue morphine  drip and titrate for comfort

## 2024-03-06 NOTE — Progress Notes (Deleted)
 [name] Date of Birth: 12/06/1940   History of Present Illness: [name] is seen today for followup of  atrial fibrillation and HTN. He has a history of chronic atrial fibrillation. He failed medical therapy with Sotalol  so he has been managed with rate control and anticoagulation with Pradaxa .   He did have an upper GI bleed in July 2019. Was transfused one unit PRBC. Upper EGD showed a Dieulafoy lesion in the stomach. His pradaxa  was later resumed and he has no further bleeding.   On follow up today he feels great. He denies any chest pain, SOB, palpitations, dizziness, or edema. Energy level is good. He stays active. Caring for his wife. No bleeding problems. Takes BP at home and it has been well controlled.   Current Facility-Administered Medications on File Prior to Visit  Medication Dose Route Frequency Provider Last Rate Last Admin   acetaminophen  (TYLENOL ) tablet 650 mg  650 mg Oral Q6H PRN Smith, Rondell A, MD       Or   acetaminophen  (TYLENOL ) suppository 650 mg  650 mg Rectal Q6H PRN Claudene Maximino LABOR, MD       antiseptic oral rinse (BIOTENE) solution 15 mL  15 mL Topical PRN Claudene, Rondell A, MD       artificial tears ophthalmic solution 1 drop  1 drop Both Eyes QID PRN Claudene Maximino LABOR, MD       glycopyrrolate  (ROBINUL ) tablet 1 mg  1 mg Oral Q4H PRN Smith, Rondell A, MD       Or   glycopyrrolate  (ROBINUL ) injection 0.2 mg  0.2 mg Subcutaneous Q4H PRN Smith, Rondell A, MD       Or   glycopyrrolate  (ROBINUL ) injection 0.2 mg  0.2 mg Intravenous Q4H PRN Smith, Rondell A, MD       haloperidol  (HALDOL ) tablet 0.5 mg  0.5 mg Oral Q4H PRN Claudene Maximino A, MD       Or   haloperidol  (HALDOL ) 2 MG/ML solution 0.5 mg  0.5 mg Sublingual Q4H PRN Smith, Rondell A, MD       Or   haloperidol  lactate (HALDOL ) injection 0.5 mg  0.5 mg Intravenous Q4H PRN Smith, Rondell A, MD       LORazepam  (ATIVAN ) tablet 1 mg  1 mg Oral Q4H PRN Claudene Maximino A, MD       Or   LORazepam  (ATIVAN ) 2  MG/ML concentrated solution 1 mg  1 mg Sublingual Q4H PRN Smith, Rondell A, MD       Or   LORazepam  (ATIVAN ) injection 1 mg  1 mg Intravenous Q4H PRN Smith, Rondell A, MD       LORazepam  (ATIVAN ) injection 1 mg  1 mg Intravenous Q4H PRN Smith, Rondell A, MD       morphine  (PF) 2 MG/ML injection 1-4 mg  1-4 mg Intravenous Q15 min PRN Claudene Maximino A, MD       morphine  100mg  in NS 100mL (1mg /mL) infusion - premix  1-10 mg/hr Intravenous Continuous Smith, Rondell A, MD 1 mL/hr at 02/09/24 1102 1 mg/hr at 02/09/24 1102   ondansetron  (ZOFRAN -ODT) disintegrating tablet 4 mg  4 mg Oral Q6H PRN Claudene Maximino A, MD       Or   ondansetron  (ZOFRAN ) injection 4 mg  4 mg Intravenous Q6H PRN Smith, Rondell A, MD       sodium chloride  flush (NS) 0.9 % injection 3 mL  3 mL Intravenous Once Penna, Michael A, DO  Current Outpatient Medications on File Prior to Visit  Medication Sig Dispense Refill   Acetaminophen  (TYLENOL  ARTHRITIS PAIN PO) Take 650 mg by mouth every 8 (eight) hours as needed.     benazepril  (LOTENSIN ) 20 MG tablet TAKE 1 TABLET BY MOUTH TWICE  DAILY 200 tablet 0   Cranberry 500 MG CAPS Take 500 mg by mouth daily.     dabigatran  (PRADAXA ) 150 MG CAPS capsule TAKE 1 CAPSULE BY MOUTH TWICE  DAILY 60 capsule 11   diltiazem  (DILT-XR) 240 MG 24 hr capsule TAKE 1 CAPSULE BY MOUTH ONCE  DAILY 90 capsule 0   furosemide  (LASIX ) 20 MG tablet Take 20 mg daily if needed for swelling 90 tablet 3   Garlic 1000 MG CAPS Take 1 capsule by mouth daily.     hydrALAZINE  (APRESOLINE ) 25 MG tablet TAKE 1 TABLET BY MOUTH 3 TIMES  DAILY 270 tablet 1   MAGNESIUM PO Take 250 mg by mouth daily.     metoprolol  tartrate (LOPRESSOR ) 50 MG tablet TAKE 1 TABLET BY MOUTH TWICE  DAILY 180 tablet 0   Multiple Vitamin (MULTIVITAMIN) tablet Take 1 tablet by mouth daily.     pantoprazole  (PROTONIX ) 20 MG tablet TAKE 1 TABLET BY MOUTH DAILY 100 tablet 2   pantoprazole  (PROTONIX ) 20 MG tablet Take 1 tablet (20 mg total) by  mouth daily. 100 tablet 2   potassium chloride  SA (KLOR-CON  M) 20 MEQ tablet TAKE 1 TABLET BY MOUTH DAILY 90 tablet 0   sildenafil (VIAGRA) 100 MG tablet Take 100 mg by mouth daily as needed for erectile dysfunction.     tamsulosin (FLOMAX) 0.4 MG CAPS capsule Take 0.4 mg by mouth daily after supper.   0    Allergies  Allergen Reactions   Hctz [Hydrochlorothiazide] Photosensitivity   Cipro Iv [Ciprofloxacin] Hives and Rash    Caused red streaks up the arm    Past Medical History:  Diagnosis Date   Acute blood loss anemia 03/01/2018   Acute GI bleeding 02/28/2018   Basal cell carcinoma    Chronic atrial fibrillation (HCC)    Chronic dermatitis    CKD (chronic kidney disease), stage III (HCC)    a. Probable based on historical Cr. H/o ARF. Previously saw Dr. Perri.   Erectile dysfunction    GI bleed    Hypertension    Hypokalemia    Meningioma (HCC)    Probable 13 x 12 mm meningioma in right frontal region   Thrombocytopenia (HCC)    improved    Past Surgical History:  Procedure Laterality Date   ESOPHAGOGASTRODUODENOSCOPY (EGD) WITH PROPOFOL  N/A 03/01/2018   Procedure: ESOPHAGOGASTRODUODENOSCOPY (EGD) WITH PROPOFOL ;  Surgeon: Legrand Victory LITTIE DOUGLAS, MD;  Location: [facility] ENDOSCOPY;  Service: Gastroenterology;  Laterality: N/A;   EYE SURGERY     spot removed from cornea   SCHLEROTHERAPY  03/01/2018   Procedure: SCHLEROTHERAPY OF VARICES;  Surgeon: Legrand Victory LITTIE DOUGLAS, MD;  Location: [facility] ENDOSCOPY;  Service: Gastroenterology;;  epinephrine  1:10,000 injection / hemostasis clips   TEE WITH CARDIOVERSION  12/14/2004   Successful TEE guided electrical cardioversion. -- showed mild mitral insufficiency and trace pulmonic insufficiency    TOENAIL EXCISION     ingrown toenail removal   TONSILLECTOMY      Social History   Tobacco Use  Smoking Status Former   Current packs/day: 0.00   Average packs/day: 1 pack/day for 10.0 years (10.0 ttl pk-yrs)   Types: Cigarettes   Start date:  04/25/1960   Quit date:  04/25/1970   Years since quitting: 53.8  Smokeless Tobacco Never    Social History   Substance and Sexual Activity  Alcohol  Use No    Family History  Problem Relation Age of Onset   Heart disease Mother        with pacemaker   Diabetes Mother    Asthma Mother    Heart failure Mother    Bladder Cancer Mother    Hypertension Father    Pancreatic cancer Father    Diabetes Brother    Hypertension Sister    Diabetes Sister     Review of Systems: As noted in history of present illness.  All other systems were reviewed and are negative.  Physical Exam: There were no vitals taken for this visit. GENERAL:  Well appearing HEENT:  PERRL, EOMI, sclera are clear. Oropharynx is clear. NECK:  No jugular venous distention, carotid upstroke brisk and symmetric, no bruits, no thyromegaly or adenopathy LUNGS:  Clear to auscultation bilaterally CHEST:  Unremarkable HEART:  IRRR,  PMI not displaced or sustained,S1 and S2 within normal limits, no S3, no S4: no clicks, no rubs, no murmurs ABD:  Soft, nontender. BS +, no masses or bruits. No hepatomegaly, no splenomegaly EXT:  2 + pulses throughout, no edema, no cyanosis no clubbing SKIN:  Warm and dry.  No rashes NEURO:  Alert and oriented x 3. Cranial nerves II through XII intact. PSYCH:  Cognitively intact    LABORATORY DATA: Lab Results  Component Value Date   WBC 7.9 02/09/2024   HGB 15.3 02/09/2024   HCT 45.0 02/09/2024   PLT 123 (L) 02/09/2024   GLUCOSE 133 (H) 02/09/2024   CHOL 112 05/10/2023   TRIG 87 05/10/2023   HDL 45 05/10/2023   LDLCALC 50 05/10/2023   ALT 16 02/09/2024   AST 25 02/09/2024   NA 141 02/09/2024   K 3.5 02/09/2024   CL 106 02/09/2024   CREATININE 1.20 02/09/2024   BUN 23 02/09/2024   CO2 25 02/09/2024   TSH 2.38 11/08/2023   PSA 3.61 05/10/2023   INR 1.4 (H) 02/09/2024        Assessment / Plan: 1. Atrial fibrillation.  permanent. He is asymptomatic. Rate is well  controlled. Continue Pradaxa .  We will continue a long-term strategy of rate control and anticoagulation.   2. Hypertension. Blood pressure is well controlled.     I will follow up in 1 year.

## 2024-03-06 DEATH — deceased

## 2024-03-16 ENCOUNTER — Other Ambulatory Visit: Payer: Self-pay | Admitting: Cardiology
